# Patient Record
Sex: Male | Born: 1945 | Race: White | Hispanic: No | Marital: Married | Smoking: Former smoker
Health system: Southern US, Community
[De-identification: ages and names within clinical notes are randomized; demographics above are authoritative.]

## PROBLEM LIST (undated history)

## (undated) DIAGNOSIS — M545 Low back pain, unspecified: Secondary | ICD-10-CM

## (undated) DIAGNOSIS — M1990 Unspecified osteoarthritis, unspecified site: Secondary | ICD-10-CM

## (undated) DIAGNOSIS — Z972 Presence of dental prosthetic device (complete) (partial): Secondary | ICD-10-CM

## (undated) DIAGNOSIS — G8929 Other chronic pain: Secondary | ICD-10-CM

## (undated) DIAGNOSIS — E785 Hyperlipidemia, unspecified: Secondary | ICD-10-CM

## (undated) DIAGNOSIS — K219 Gastro-esophageal reflux disease without esophagitis: Secondary | ICD-10-CM

## (undated) DIAGNOSIS — C3490 Malignant neoplasm of unspecified part of unspecified bronchus or lung: Secondary | ICD-10-CM

## (undated) DIAGNOSIS — C61 Malignant neoplasm of prostate: Secondary | ICD-10-CM

## (undated) DIAGNOSIS — Z973 Presence of spectacles and contact lenses: Secondary | ICD-10-CM

## (undated) DIAGNOSIS — R351 Nocturia: Secondary | ICD-10-CM

## (undated) HISTORY — PX: CHOLECYSTECTOMY: SHX55

## (undated) MED FILL — Fosaprepitant Dimeglumine For IV Infusion 150 MG (Base Eq): INTRAVENOUS | Qty: 5 | Status: AC

---

## 2002-07-13 DIAGNOSIS — E7800 Pure hypercholesterolemia, unspecified: Secondary | ICD-10-CM | POA: Insufficient documentation

## 2002-11-16 DIAGNOSIS — R600 Localized edema: Secondary | ICD-10-CM | POA: Insufficient documentation

## 2002-12-10 DIAGNOSIS — I517 Cardiomegaly: Secondary | ICD-10-CM | POA: Insufficient documentation

## 2002-12-25 DIAGNOSIS — D126 Benign neoplasm of colon, unspecified: Secondary | ICD-10-CM | POA: Insufficient documentation

## 2006-07-01 DIAGNOSIS — M1990 Unspecified osteoarthritis, unspecified site: Secondary | ICD-10-CM | POA: Insufficient documentation

## 2013-04-12 HISTORY — PX: CATARACT EXTRACTION W/ INTRAOCULAR LENS  IMPLANT, BILATERAL: SHX1307

## 2014-12-27 ENCOUNTER — Other Ambulatory Visit: Payer: Self-pay | Admitting: Urology

## 2014-12-27 ENCOUNTER — Ambulatory Visit (INDEPENDENT_AMBULATORY_CARE_PROVIDER_SITE_OTHER): Payer: Medicare Other | Admitting: Urology

## 2014-12-27 DIAGNOSIS — N4340 Spermatocele of epididymis, unspecified: Secondary | ICD-10-CM | POA: Diagnosis not present

## 2015-01-02 ENCOUNTER — Ambulatory Visit (HOSPITAL_COMMUNITY)
Admission: RE | Admit: 2015-01-02 | Discharge: 2015-01-02 | Disposition: A | Discharge status: Home / Self Care | Payer: Medicare Other | Source: Ambulatory Visit | Attending: Urology | Admitting: Urology

## 2015-01-02 DIAGNOSIS — N508 Other specified disorders of male genital organs: Secondary | ICD-10-CM | POA: Diagnosis present

## 2015-01-02 DIAGNOSIS — N4340 Spermatocele of epididymis, unspecified: Secondary | ICD-10-CM | POA: Insufficient documentation

## 2015-01-20 ENCOUNTER — Other Ambulatory Visit: Payer: Self-pay | Admitting: Urology

## 2015-01-23 ENCOUNTER — Encounter (HOSPITAL_BASED_OUTPATIENT_CLINIC_OR_DEPARTMENT_OTHER): Payer: Self-pay | Admitting: *Deleted

## 2015-01-23 NOTE — Progress Notes (Signed)
SPOKE W/ WIFE.  NPO AFTER MN.  ARRIVE AT 0730.  NEEDS HG. WILL TAKE TRAMADOL AND ZANTAC AM DOS W/ SIPS OF WATER.  PT TO CALL BACK TO UPDATE IF HAD HX SURGERY'S.

## 2015-01-29 NOTE — H&P (Signed)
History of Present Illness  [name] is a 69 yo WM who is self referred for a growth in the left scrotum that has been present for several years but it has been enlarging and is getting in the way.  He has some pulling discomfort.  He has some LUTS with frequency, urgency, nocturia, intermittency and a reduced stream.  He has no prior history of UTI's or GU surgery.  He has no associated signs or symptoms.   Past Medical History  1. History of High cholesterol (E78.0)  2. History of arthritis (Z87.39)  3. History of esophageal reflux (Z87.19)  Surgical History  1. History of No Surgical Problems  Current Meds  1. Aspirin 81 MG Oral Tablet Delayed Release;  Therapy: (Recorded:16Sep2016) to Recorded  2. Lipitor 40 MG Oral Tablet;  Therapy: (Recorded:16Sep2016) to Recorded  3. Meloxicam 7.5 MG Oral Tablet;  Therapy: (Recorded:16Sep2016) to Recorded  4. TraMADol HCl - 50 MG Oral Tablet;  Therapy: (Recorded:16Sep2016) to Recorded  Allergies  1. No Known Drug Allergies  Family History  1. Family history of malignant neoplasm (Z80.9) : Mother  2. Family history of prostate cancer (Z80.42) : Brother  3. Family history of stroke (Z82.3) : Father  Social History   Alcohol use (Z78.9)   Caffeine use (F15.90)   2 per day   Former smoker [phone])   2 pks per day, smoked for 33 yrs, quit 24 yrs ago   Occupation   Administrator  Review of Systems Genitourinary, constitutional, skin, eye, otolaryngeal, hematologic/lymphatic, cardiovascular, pulmonary, endocrine, musculoskeletal, gastrointestinal, neurological and psychiatric system(s) were reviewed and pertinent findings if present are noted and are otherwise negative.  Genitourinary: urinary frequency, feelings of urinary urgency, nocturia, weak urinary stream and urinary stream starts and stops.  Gastrointestinal: heartburn.  Musculoskeletal: back pain.    Vitals Vital Signs [Data Includes: Last 1 Day]  Recorded: [id number]  08:52AM  Height: 6 ft 2 in Weight: 246 lb  BMI Calculated: 31.58 BSA Calculated: 2.37 Blood Pressure: 136 / 80 Temperature: 98.1 F Heart Rate: 93  Physical Exam Constitutional: Well nourished and well developed . No acute distress.  ENT:. The ears and nose are normal in appearance.  Neck: The appearance of the neck is normal and no neck mass is present.  Pulmonary: No respiratory distress and normal respiratory rhythm and effort.  Cardiovascular: Heart rate and rhythm are normal . No peripheral edema.  Abdomen: The abdomen is soft and nontender. No masses are palpated. No CVA tenderness. No hernias are palpable. No hepatosplenomegaly noted.  Genitourinary: Examination of the penis demonstrates no discharge, no masses, no lesions and a normal meatus. The scrotum is normal in appearance and without lesions. The right epididymis is found to have a 1 cm spermatocele, but palpably normal and non-tender. The left epididymis is found to have a 4 cm spermatocele, but palpably normal and non-tender. The right testis is palpably normal, non-tender and without masses. The left testis is normal, non-tender and without masses.  Lymphatics: The supraclavicular, axillary, femoral and inguinal nodes are not enlarged or tender.  Skin: Normal skin turgor, no visible rash and no visible skin lesions.  Neuro/Psych:. Mood and affect are appropriate.    Results/Data Urine [Data Includes: Last 1 Day]   [id number]  COLOR YELLOW   APPEARANCE CLEAR   SPECIFIC GRAVITY 1.020   pH 5.0   GLUCOSE NEGATIVE   BILIRUBIN NEGATIVE   KETONE NEGATIVE   BLOOD NEGATIVE   PROTEIN NEGATIVE   NITRITE  NEGATIVE   LEUKOCYTE ESTERASE NEGATIVE    The following clinical lab reports were reviewed:  UA reviewed.    Assessment  1. 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   Plan Spermatocele   1. Follow-up Schedule Surgery Office  Follow-up  Status: Hold For - Appointment   Requested for: [id number]   2. SCROTAL U/S; Status:Hold For - Appointment,Date of Service; Requested  for:16Sep2016;  Unlinked   3. UA With REFLEX; [Do Not Release]; Status:Hold For - [employer];  Requested for:14Sep2016;    I am going to get a scrotal US to confirm the diagnosis and r/o complicating factors and will let him know the results. He will let us know if he wants to scheduled spermatocelectomy once he has those results. I have reviewed the risks of the procedure including bleeding, infection, testicular loss or atrophy, recurrent spermatocele, pain, thrombotic events and anesthetic risks.

## 2015-01-30 ENCOUNTER — Ambulatory Visit (HOSPITAL_BASED_OUTPATIENT_CLINIC_OR_DEPARTMENT_OTHER)
Admission: RE | Admit: 2015-01-30 | Discharge: 2015-01-30 | Disposition: A | Discharge status: Home / Self Care | Payer: Medicare Other | Source: Ambulatory Visit | Attending: Urology | Admitting: Urology

## 2015-01-30 ENCOUNTER — Ambulatory Visit (HOSPITAL_BASED_OUTPATIENT_CLINIC_OR_DEPARTMENT_OTHER): Payer: Medicare Other | Admitting: Anesthesiology

## 2015-01-30 ENCOUNTER — Encounter (HOSPITAL_BASED_OUTPATIENT_CLINIC_OR_DEPARTMENT_OTHER): Payer: Self-pay

## 2015-01-30 ENCOUNTER — Encounter (HOSPITAL_BASED_OUTPATIENT_CLINIC_OR_DEPARTMENT_OTHER)
Admission: RE | Disposition: A | Discharge status: Home / Self Care | Payer: Self-pay | Source: Ambulatory Visit | Attending: Urology

## 2015-01-30 DIAGNOSIS — N4340 Spermatocele of epididymis, unspecified: Secondary | ICD-10-CM | POA: Insufficient documentation

## 2015-01-30 DIAGNOSIS — K219 Gastro-esophageal reflux disease without esophagitis: Secondary | ICD-10-CM | POA: Diagnosis not present

## 2015-01-30 DIAGNOSIS — Z79899 Other long term (current) drug therapy: Secondary | ICD-10-CM | POA: Diagnosis not present

## 2015-01-30 DIAGNOSIS — Z791 Long term (current) use of non-steroidal anti-inflammatories (NSAID): Secondary | ICD-10-CM | POA: Diagnosis not present

## 2015-01-30 DIAGNOSIS — M1990 Unspecified osteoarthritis, unspecified site: Secondary | ICD-10-CM | POA: Insufficient documentation

## 2015-01-30 DIAGNOSIS — Z87891 Personal history of nicotine dependence: Secondary | ICD-10-CM | POA: Diagnosis not present

## 2015-01-30 DIAGNOSIS — E7800 Pure hypercholesterolemia, unspecified: Secondary | ICD-10-CM | POA: Insufficient documentation

## 2015-01-30 DIAGNOSIS — Z7982 Long term (current) use of aspirin: Secondary | ICD-10-CM | POA: Diagnosis not present

## 2015-01-30 HISTORY — DX: Low back pain, unspecified: M54.50

## 2015-01-30 HISTORY — DX: Other chronic pain: G89.29

## 2015-01-30 HISTORY — DX: Low back pain: M54.5

## 2015-01-30 HISTORY — PX: SPERMATOCELECTOMY: SHX2420

## 2015-01-30 HISTORY — DX: Unspecified osteoarthritis, unspecified site: M19.90

## 2015-01-30 HISTORY — DX: Presence of dental prosthetic device (complete) (partial): Z97.2

## 2015-01-30 HISTORY — DX: Gastro-esophageal reflux disease without esophagitis: K21.9

## 2015-01-30 HISTORY — DX: Hyperlipidemia, unspecified: E78.5

## 2015-01-30 HISTORY — DX: Presence of spectacles and contact lenses: Z97.3

## 2015-01-30 LAB — POCT HEMOGLOBIN-HEMACUE: HEMOGLOBIN: 16.3 g/dL (ref 13.0–17.0)

## 2015-01-30 SURGERY — EXCISION, SPERMATOCELE
Anesthesia: General | Laterality: Left

## 2015-01-30 MED ORDER — EPHEDRINE SULFATE 50 MG/ML IJ SOLN
INTRAMUSCULAR | Status: DC | PRN
  Administered 2015-01-30: 10 mg via INTRAVENOUS

## 2015-01-30 MED ORDER — HYDROCODONE-ACETAMINOPHEN 5-325 MG PO TABS: 1.0000 | ORAL_TABLET | Freq: Four times a day (QID) | ORAL | Status: DC | PRN

## 2015-01-30 MED ORDER — LIDOCAINE HCL (CARDIAC) 20 MG/ML IV SOLN
INTRAVENOUS | Status: DC | PRN
  Administered 2015-01-30: 80 mg via INTRAVENOUS

## 2015-01-30 MED ORDER — MEPERIDINE HCL 25 MG/ML IJ SOLN
6.2500 mg | INTRAMUSCULAR | Status: DC | PRN
  Filled 2015-01-30: qty 1

## 2015-01-30 MED ORDER — SODIUM CHLORIDE 0.9 % IJ SOLN
3.0000 mL | INTRAMUSCULAR | Status: DC | PRN
  Filled 2015-01-30: qty 3

## 2015-01-30 MED ORDER — SODIUM CHLORIDE 0.9 % IV SOLN
250.0000 mL | INTRAVENOUS | Status: DC | PRN
  Filled 2015-01-30: qty 250

## 2015-01-30 MED ORDER — BUPIVACAINE HCL (PF) 0.25 % IJ SOLN
INTRAMUSCULAR | Status: DC | PRN
  Administered 2015-01-30 (×2): 5 mL

## 2015-01-30 MED ORDER — FENTANYL CITRATE (PF) 100 MCG/2ML IJ SOLN
25.0000 ug | INTRAMUSCULAR | Status: DC | PRN
  Filled 2015-01-30: qty 1

## 2015-01-30 MED ORDER — ACETAMINOPHEN 650 MG RE SUPP
650.0000 mg | RECTAL | Status: DC | PRN
  Filled 2015-01-30: qty 1

## 2015-01-30 MED ORDER — DEXAMETHASONE SODIUM PHOSPHATE 4 MG/ML IJ SOLN
INTRAMUSCULAR | Status: DC | PRN
  Administered 2015-01-30: 10 mg via INTRAVENOUS

## 2015-01-30 MED ORDER — CEFAZOLIN SODIUM-DEXTROSE 2-3 GM-% IV SOLR
2.0000 g | INTRAVENOUS | Status: AC
  Administered 2015-01-30: 2 g via INTRAVENOUS
  Filled 2015-01-30: qty 50

## 2015-01-30 MED ORDER — HYDROMORPHONE HCL 1 MG/ML IJ SOLN
0.2500 mg | INTRAMUSCULAR | Status: DC | PRN
  Filled 2015-01-30: qty 1

## 2015-01-30 MED ORDER — OXYCODONE HCL 5 MG PO TABS
ORAL_TABLET | ORAL | Status: AC
  Filled 2015-01-30: qty 1

## 2015-01-30 MED ORDER — SODIUM CHLORIDE 0.9 % IJ SOLN
3.0000 mL | Freq: Two times a day (BID) | INTRAMUSCULAR | Status: DC
  Filled 2015-01-30: qty 3

## 2015-01-30 MED ORDER — LACTATED RINGERS IV SOLN
INTRAVENOUS | Status: DC
  Administered 2015-01-30: 08:00:00 via INTRAVENOUS
  Filled 2015-01-30: qty 1000

## 2015-01-30 MED ORDER — ACETAMINOPHEN 325 MG PO TABS
650.0000 mg | ORAL_TABLET | ORAL | Status: DC | PRN
  Filled 2015-01-30: qty 2

## 2015-01-30 MED ORDER — FENTANYL CITRATE (PF) 100 MCG/2ML IJ SOLN
INTRAMUSCULAR | Status: AC
  Filled 2015-01-30: qty 4

## 2015-01-30 MED ORDER — MIDAZOLAM HCL 2 MG/2ML IJ SOLN
INTRAMUSCULAR | Status: AC
  Filled 2015-01-30: qty 2

## 2015-01-30 MED ORDER — MIDAZOLAM HCL 5 MG/5ML IJ SOLN
INTRAMUSCULAR | Status: DC | PRN
  Administered 2015-01-30: 2 mg via INTRAVENOUS

## 2015-01-30 MED ORDER — PROMETHAZINE HCL 25 MG/ML IJ SOLN
6.2500 mg | INTRAMUSCULAR | Status: DC | PRN
Start: 2015-01-30 — End: 2015-01-30
  Filled 2015-01-30: qty 1

## 2015-01-30 MED ORDER — FENTANYL CITRATE (PF) 100 MCG/2ML IJ SOLN
INTRAMUSCULAR | Status: DC | PRN
  Administered 2015-01-30 (×2): 50 ug via INTRAVENOUS

## 2015-01-30 MED ORDER — CEFAZOLIN SODIUM-DEXTROSE 2-3 GM-% IV SOLR
INTRAVENOUS | Status: AC
  Filled 2015-01-30: qty 50

## 2015-01-30 MED ORDER — PROPOFOL 10 MG/ML IV BOLUS
INTRAVENOUS | Status: DC | PRN
  Administered 2015-01-30: 160 mg via INTRAVENOUS

## 2015-01-30 MED ORDER — OXYCODONE HCL 5 MG PO TABS
5.0000 mg | ORAL_TABLET | ORAL | Status: DC | PRN
  Administered 2015-01-30: 5 mg via ORAL
  Filled 2015-01-30: qty 2

## 2015-01-30 SURGICAL SUPPLY — 39 items
APPLICATOR COTTON TIP 6IN STRL (MISCELLANEOUS) ×3 IMPLANT
BLADE SURG 15 STRL LF DISP TIS (BLADE) ×1 IMPLANT
BLADE SURG 15 STRL SS (BLADE) ×2
BNDG GAUZE ELAST 4 BULKY (GAUZE/BANDAGES/DRESSINGS) ×3 IMPLANT
CANISTER SUCTION 1200CC (MISCELLANEOUS) IMPLANT
CANISTER SUCTION 2500CC (MISCELLANEOUS) IMPLANT
CLEANER CAUTERY TIP 5X5 PAD (MISCELLANEOUS) ×1 IMPLANT
CLOTH BEACON ORANGE TIMEOUT ST (SAFETY) ×3 IMPLANT
COVER BACK TABLE 60X90IN (DRAPES) ×3 IMPLANT
COVER MAYO STAND STRL (DRAPES) ×3 IMPLANT
DISSECTOR ROUND CHERRY 3/8 STR (MISCELLANEOUS) IMPLANT
DRAIN PENROSE 18X1/4 LTX STRL (WOUND CARE) IMPLANT
DRAPE PED LAPAROTOMY (DRAPES) ×3 IMPLANT
ELECT REM PT RETURN 9FT ADLT (ELECTROSURGICAL) ×3
ELECTRODE REM PT RTRN 9FT ADLT (ELECTROSURGICAL) ×1 IMPLANT
GLOVE BIO SURGEON STRL SZ7.5 (GLOVE) ×6 IMPLANT
GLOVE BIOGEL PI IND STRL 7.5 (GLOVE) ×1 IMPLANT
GLOVE BIOGEL PI INDICATOR 7.5 (GLOVE) ×2
GLOVE SURG SS PI 8.0 STRL IVOR (GLOVE) ×3 IMPLANT
GOWN STRL REUS W/ TWL LRG LVL4 (GOWN DISPOSABLE) ×1 IMPLANT
GOWN STRL REUS W/TWL LRG LVL4 (GOWN DISPOSABLE) ×2
GOWN W/2 COTTON TOWELS 2 STD (GOWNS) ×3 IMPLANT
KIT ROOM TURNOVER WOR (KITS) ×3 IMPLANT
NEEDLE HYPO 22GX1.5 SAFETY (NEEDLE) ×6 IMPLANT
NS IRRIG 500ML POUR BTL (IV SOLUTION) ×3 IMPLANT
PACK BASIN DAY SURGERY FS (CUSTOM PROCEDURE TRAY) ×3 IMPLANT
PAD CLEANER CAUTERY TIP 5X5 (MISCELLANEOUS) ×2
PENCIL BUTTON HOLSTER BLD 10FT (ELECTRODE) ×3 IMPLANT
SUPPORT SCROTAL LG STRP (MISCELLANEOUS) ×2 IMPLANT
SUPPORTER ATHLETIC LG (MISCELLANEOUS) ×1
SUT CHROMIC 3 0 SH 27 (SUTURE) ×9 IMPLANT
SUT VICRYL 0 TIES 12 18 (SUTURE) ×3 IMPLANT
SYR BULB IRRIGATION 50ML (SYRINGE) IMPLANT
SYRINGE CONTROL L 12CC (SYRINGE) ×3 IMPLANT
TRAY DSU PREP LF (CUSTOM PROCEDURE TRAY) ×3 IMPLANT
TUBE CONNECTING 12'X1/4 (SUCTIONS) ×1
TUBE CONNECTING 12X1/4 (SUCTIONS) ×2 IMPLANT
WATER STERILE IRR 500ML POUR (IV SOLUTION) IMPLANT
YANKAUER SUCT BULB TIP NO VENT (SUCTIONS) ×3 IMPLANT

## 2015-01-30 NOTE — Discharge Instructions (Addendum)
Hydrocelectomy, Care After Refer to this sheet in the next few weeks. These instructions provide you with information about caring for yourself after your procedure. Your health care provider may also give you more specific instructions. Your treatment has been planned according to current medical practices, but problems sometimes occur. Call your health care provider if you have any problems or questions after your procedure. WHAT TO EXPECT AFTER THE PROCEDURE After your procedure, it is common for the pouch that holds your testicles (scrotum) to be painful, swollen, and bruised. HOME CARE INSTRUCTIONS Bathing  Ask your health care provider when you can shower, take baths, or go swimming.  If you were told to wear an athletic support strap, take it off when you shower or take a bath. Incision Care  Follow instructions from your health care provider about how to take care of your incision. Make sure you:  Wash your hands with soap and water before you change your bandage (dressing). If soap and water are not available, use hand sanitizer.  Change your dressing as told by your health care provider.  Leave stitches (sutures) in place.  Check your incision and scrotum every day for signs of infection. Check for:  More redness, swelling, or pain.  Blood or fluid.  Warmth.  Pus or a bad smell. Managing Pain, Stiffness, and Swelling  If directed, apply ice to the injured area:  Put ice in a plastic bag.  Place a towel between your skin and the bag.  Leave the ice on for 20 minutes, 2-3 times per day. Driving  Do not drive for 24 hours if you received a sedative.  Do not drive or operate heavy machinery while taking prescription pain medicine.  Ask your health care provider when it is safe to drive. Activity  Do not do any activities that require great strength and energy (are vigorous) for as long as told by your health care provider.  Return to your normal activities as  told by your health care provider. Ask your health care provider what activities are safe for you.  Do not lift anything that is heavier than 10 lb (4.5 kg) until your health care provider says that it is safe. General Instructions  Take over-the-counter and prescription medicines only as told by your health care provider.  Keep all follow-up visits as told by your health care provider. This is important.  If you were given an athletic support strap, wear it as told by your health care provider.  If you had a drain put in during the procedure, you will need to return to have it removed. SEEK MEDICAL CARE IF:  Your pain gets worse.  You have more redness, swelling, or pain around your scrotum.  You have blood or fluid coming from your scrotum.  Your incision feels warm to the touch.  You have pus or a bad smell coming from your scrotum.  You have a fever.   This information is not intended to replace advice given to you by your health care provider. Make sure you discuss any questions you have with your health care provider.   Document Released: 12/18/2014 Document Reviewed: 09/25/2014 Elsevier Interactive Patient Education 2016 Lance Stafford can shower in 48 hours.  You will probably want to wait a week to return to work.    Post Anesthesia Home Care Instructions  Activity: Get plenty of rest for the remainder of the day. A responsible adult should stay with you for 24 hours following  the procedure.  For the next 24 hours, DO NOT: -Drive a car -Paediatric nurse -Drink alcoholic beverages -Take any medication unless instructed by your physician -Make any legal decisions or sign important papers.  Meals: Start with liquid foods such as gelatin or soup. Progress to regular foods as tolerated. Avoid greasy, spicy, heavy foods. If nausea and/or vomiting occur, drink only clear liquids until the nausea and/or vomiting subsides. Call your physician if vomiting  continues.  Special Instructions/Symptoms: Your throat may feel dry or sore from the anesthesia or the breathing tube placed in your throat during surgery. If this causes discomfort, gargle with warm salt water. The discomfort should disappear within 24 hours.  If you had a scopolamine patch placed behind your ear for the management of post- operative nausea and/or vomiting:  1. The medication in the patch is effective for 72 hours, after which it should be removed.  Wrap patch in a tissue and discard in the trash. Wash hands thoroughly with soap and water. 2. You may remove the patch earlier than 72 hours if you experience unpleasant side effects which may include dry mouth, dizziness or visual disturbances. 3. Avoid touching the patch. Wash your hands with soap and water after contact with the patch.

## 2015-01-30 NOTE — Anesthesia Preprocedure Evaluation (Addendum)
Anesthesia Evaluation  Patient identified by MRN, date of birth, ID band Patient awake    Reviewed: Allergy & Precautions, NPO status , Patient's Chart, lab work & pertinent test results  Airway Mallampati: II  TM Distance: >3 FB Neck ROM: Full    Dental no notable dental hx. (+) Teeth Intact, Partial Upper, Partial Lower   Pulmonary former smoker,    Pulmonary exam normal breath sounds clear to auscultation       Cardiovascular negative cardio ROS Normal cardiovascular exam Rhythm:Regular Rate:Normal     Neuro/Psych negative neurological ROS  negative psych ROS   GI/Hepatic Neg liver ROS, GERD  Medicated,  Endo/Other  negative endocrine ROS  Renal/GU negative Renal ROS     Musculoskeletal negative musculoskeletal ROS (+) Arthritis ,   Abdominal   Peds  Hematology negative hematology ROS (+)   Anesthesia Other Findings   Reproductive/Obstetrics                           Anesthesia Physical Anesthesia Plan  ASA: II  Anesthesia Plan: General   Post-op Pain Management:    Induction: Intravenous  Airway Management Planned: LMA  Additional Equipment:   Intra-op Plan:   Post-operative Plan: Extubation in OR  Informed Consent: I have reviewed the patients History and Physical, chart, labs and discussed the procedure including the risks, benefits and alternatives for the proposed anesthesia with the patient or authorized representative who has indicated his/her understanding and acceptance.   Dental advisory given  Plan Discussed with: CRNA and Anesthesiologist  Anesthesia Plan Comments:        Anesthesia Quick Evaluation

## 2015-01-30 NOTE — Anesthesia Procedure Notes (Signed)
Procedure Name: LMA Insertion Date/Time: 01/30/2015 9:04 AM Performed by: Wanita Chamberlain Pre-anesthesia Checklist: Patient identified, Timeout performed, Emergency Drugs available, Suction available and Patient being monitored Patient Re-evaluated:Patient Re-evaluated prior to inductionOxygen Delivery Method: Circle system utilized Preoxygenation: Pre-oxygenation with 100% oxygen Intubation Type: IV induction Ventilation: Mask ventilation without difficulty LMA: LMA inserted LMA Size: 5.0 Number of attempts: 1 Placement Confirmation: positive ETCO2 and breath sounds checked- equal and bilateral Tube secured with: Tape Dental Injury: Teeth and Oropharynx as per pre-operative assessment

## 2015-01-30 NOTE — Brief Op Note (Signed)
01/30/2015  10:01 AM  PATIENT:  Lance Stafford  69 y.o. male  PRE-OPERATIVE DIAGNOSIS:  LEFT SPERMATOCELE  POST-OPERATIVE DIAGNOSIS:  LEFT SPERMATOCELE  PROCEDURE:  Procedure(s): SPERMATOCELECTOMY (Left)  SURGEON:  Surgeon(s) and Role:    * Irine Seal, MD - Primary  PHYSICIAN ASSISTANT:   ASSISTANTS: none   ANESTHESIA:   general  EBL:     BLOOD ADMINISTERED:none  DRAINS: none   LOCAL MEDICATIONS USED:  MARCAINE    and Amount: 10 ml  SPECIMEN:  No Specimen  DISPOSITION OF SPECIMEN:  N/A  COUNTS:  YES  TOURNIQUET:  * No tourniquets in log *  DICTATION: .Other Dictation: Dictation Number [id number]  PLAN OF CARE: Discharge to home after PACU  PATIENT DISPOSITION:  PACU - hemodynamically stable.   Delay start of Pharmacological VTE agent (>24hrs) due to surgical blood loss or risk of bleeding: not applicable

## 2015-01-30 NOTE — Anesthesia Postprocedure Evaluation (Signed)
Anesthesia Post Note  Patient: Lance Stafford  Procedure(s) Performed: Procedure(s) (LRB): SPERMATOCELECTOMY (Left)  Anesthesia type: General  Patient location: PACU  Post pain: Pain level controlled  Post assessment: Post-op Vital signs reviewed  Last Vitals: BP 128/75 mmHg  Pulse 60  Temp(Src) 36.4 C (Oral)  Resp 14  Ht '6\' 1"'$  (1.854 m)  Wt 234 lb 8 oz (106.369 kg)  BMI 30.95 kg/m2  SpO2 99%  Post vital signs: Reviewed  Level of consciousness: sedated  Complications: No apparent anesthesia complications

## 2015-01-30 NOTE — Transfer of Care (Signed)
Immediate Anesthesia Transfer of Care Note  Patient: Lance Stafford  Procedure(s) Performed: Procedure(s): SPERMATOCELECTOMY (Left)  Patient Location: PACU  Anesthesia Type:General  Level of Consciousness: awake, alert , oriented and patient cooperative  Airway & Oxygen Therapy: Patient Spontanous Breathing and Patient connected to nasal cannula oxygen  Post-op Assessment: Report given to RN and Post -op Vital signs reviewed and stable  Post vital signs: Reviewed and stable  Last Vitals:  Filed Vitals:   01/30/15 0717  BP: 129/75  Pulse: 55  Temp: 36.6 C  Resp: 16    Complications: No apparent anesthesia complications

## 2015-01-30 NOTE — Interval H&P Note (Signed)
History and Physical Interval Note:  01/30/2015 8:50 AM  Lance Stafford  has presented today for surgery, with the diagnosis of LEFT SPERMATOCELE  The various methods of treatment have been discussed with the patient and family. After consideration of risks, benefits and other options for treatment, the patient has consented to  Procedure(s): SPERMATOCELECTOMY (Left) as a surgical intervention .  The patient's history has been reviewed, patient examined, no change in status, stable for surgery.  I have reviewed the patient's chart and labs.  Questions were answered to the patient's satisfaction.     [name]

## 2015-01-31 ENCOUNTER — Encounter (HOSPITAL_BASED_OUTPATIENT_CLINIC_OR_DEPARTMENT_OTHER): Payer: Self-pay | Admitting: Urology

## 2015-02-03 NOTE — Op Note (Signed)
NAME:  VERDELL, DYKMAN NO.:  [id number]  MEDICAL RECORD NO.:  [id number]  LOCATION:                               FACILITY:  [facility]  PHYSICIAN:  Marshall Cork. Jeffie Pollock, M.D.    DATE OF BIRTH:  February 25, 1946  DATE OF PROCEDURE:  01/30/2015 DATE OF DISCHARGE:  01/30/2015                              OPERATIVE REPORT   PROCEDURE PERFORMED:  Left spermatocelectomy.  PREOPERATIVE DIAGNOSIS:  Left spermatocelectomy.  POSTOPERATIVE DIAGNOSIS:  Left spermatocelectomy.  ANESTHESIA:  General.  SPECIMEN:  None.  DRAINS:  None.  BLOOD LOSS:  None.  COMPLICATIONS:  None.  INDICATIONS:  Mr. Bensinger is a 69 year old male with 4-6 cm left spermatocele, who is to undergo spermatocelectomy.  FINDINGS AND PROCEDURE:  He was taken to the operating room where general anesthetic was induced.  He was fitted with PAS hose in the supine position.  His genitalia were clipped and he was prepped with Betadine solution, draped in usual sterile fashion.  He was given 2 g of Ancef for antibiotic prophylaxis.  A left anterior oblique incision was made along the scrotal skin line with the knife.  This was carried down through the dartos with the Bovie.  The testicle within the tunica vaginalis was delivered from the wound.  The tunica vaginalis was opened and the large spermatocele was dissected away from the testicle and epididymis using sharp and cautery dissection.  Once the neck of the spermatocele was identified, it was clamped with a hemostat and spermatocele sac was removed.  The neck was then oversewn with a 3-0 chromic.  Once this had been performed, inspection revealed no other significant cystic structures.  The tunica vaginalis was imbricated behind the testicle in a water bottle fashion. A core block was performed with 5 mL of 0.25% Marcaine and the testicle was returned to the left hemiscrotum.  The dartos was closed with a running 3-0 chromic and subcutaneous injection of  remaining 5 mL of Marcaine was performed along the incision line.  The skin was then closed using an interrupted 3-0 chromic vertical mattress suture.  The drapes were removed.  Wound was cleansed. The dressing was applied using 4x4s, fluffs, Kerlix, and athletic supporter.  His anesthetic was reversed.  He was moved to the recovery room in stable condition.  There were no complications.     Marshall Cork. Jeffie Pollock, M.D.     JJW/MEDQ  D:  02/02/2015  T:  02/02/2015  Job:  [id number]

## 2015-02-21 ENCOUNTER — Ambulatory Visit (INDEPENDENT_AMBULATORY_CARE_PROVIDER_SITE_OTHER): Payer: Self-pay | Admitting: Urology

## 2015-02-21 DIAGNOSIS — N4340 Spermatocele of epididymis, unspecified: Secondary | ICD-10-CM

## 2015-05-09 ENCOUNTER — Ambulatory Visit (INDEPENDENT_AMBULATORY_CARE_PROVIDER_SITE_OTHER): Payer: Medicare Other | Admitting: Urology

## 2015-05-09 ENCOUNTER — Other Ambulatory Visit: Payer: Self-pay | Admitting: Urology

## 2015-05-09 DIAGNOSIS — R972 Elevated prostate specific antigen [PSA]: Secondary | ICD-10-CM

## 2015-05-09 DIAGNOSIS — N4340 Spermatocele of epididymis, unspecified: Secondary | ICD-10-CM | POA: Diagnosis not present

## 2015-05-27 ENCOUNTER — Other Ambulatory Visit: Payer: Self-pay | Admitting: Urology

## 2015-05-27 DIAGNOSIS — R972 Elevated prostate specific antigen [PSA]: Secondary | ICD-10-CM

## 2015-05-30 ENCOUNTER — Ambulatory Visit (HOSPITAL_COMMUNITY)
Admission: RE | Admit: 2015-05-30 | Discharge: 2015-05-30 | Disposition: A | Discharge status: Home / Self Care | Payer: Medicare Other | Source: Ambulatory Visit | Attending: Urology | Admitting: Urology

## 2015-05-30 DIAGNOSIS — R972 Elevated prostate specific antigen [PSA]: Secondary | ICD-10-CM | POA: Diagnosis not present

## 2015-05-30 MED ORDER — LIDOCAINE HCL (PF) 2 % IJ SOLN
INTRAMUSCULAR | Status: AC
  Administered 2015-05-30: 10 mL
  Filled 2015-05-30: qty 10

## 2015-05-30 MED ORDER — CEFTRIAXONE SODIUM 1 G IJ SOLR
INTRAMUSCULAR | Status: AC
  Administered 2015-05-30: 1 g via INTRAMUSCULAR
  Filled 2015-05-30: qty 10

## 2015-05-30 MED ORDER — LIDOCAINE HCL (PF) 1 % IJ SOLN
5.0000 mL | Freq: Once | INTRAMUSCULAR | Status: AC
  Administered 2015-05-30: 2 mL

## 2015-05-30 MED ORDER — LIDOCAINE HCL (PF) 2 % IJ SOLN
10.0000 mL | Freq: Once | INTRAMUSCULAR | Status: AC
  Administered 2015-05-30: 10 mL

## 2015-05-30 MED ORDER — CEFTRIAXONE SODIUM 1 G IJ SOLR
1.0000 g | Freq: Once | INTRAMUSCULAR | Status: AC
  Administered 2015-05-30: 1 g via INTRAMUSCULAR

## 2015-05-30 MED ORDER — LIDOCAINE HCL (PF) 1 % IJ SOLN
INTRAMUSCULAR | Status: AC
  Administered 2015-05-30: 2 mL
  Filled 2015-05-30: qty 5

## 2015-05-30 NOTE — Discharge Instructions (Signed)
Transrectal Ultrasound-Guided Biopsy A transrectal ultrasound-guided biopsy is a procedure to take samples of tissue from your prostate. Ultrasound images are used to guide the procedure. It is usually done to check the prostate gland for cancer. BEFORE THE PROCEDURE  Do not eat or drink after midnight on the night before your procedure.  Take medicines as your doctor tells you.  Your doctor may have you stop taking some medicines 5-7 days before the procedure.  You will be given an enema before your procedure. During an enema, a liquid is put into your butt (rectum) to clear out waste.  You may have lab tests the day of your procedure.  Make plans to have someone drive you home. PROCEDURE  You will be given medicine to help you relax before the procedure. An IV tube will be put into one of your veins. It will be used to give fluids and medicine.  You will be given medicine to reduce the risk of infection (antibiotic).  You will be placed on your side.  A probe with gel will be put in your butt. This is used to take pictures of your prostate and the area around it.  A medicine to numb the area is put into your prostate.  A biopsy needle is then inserted and guided to your prostate.  Samples of prostate tissue are taken. The needle is removed.  The samples are sent to a lab to be checked. Results are usually back in 2-3 days. AFTER THE PROCEDURE  You will be taken to a room where you will be watched until you are doing okay.  You may have some pain in the area around your butt. You will be given medicines for this.  You may be able to go home the same day. Sometimes, an overnight stay in the hospital is needed.   This information is not intended to replace advice given to you by your health care provider. Make sure you discuss any questions you have with your health care provider.   Document Released: 03/17/2009 Document Revised: 04/03/2013 Document Reviewed:  11/15/2012 Elsevier Interactive Patient Education [employer].

## 2015-06-13 ENCOUNTER — Encounter (INDEPENDENT_AMBULATORY_CARE_PROVIDER_SITE_OTHER): Payer: Medicare Other | Admitting: Urology

## 2015-06-13 DIAGNOSIS — R972 Elevated prostate specific antigen [PSA]: Secondary | ICD-10-CM

## 2015-06-13 DIAGNOSIS — C61 Malignant neoplasm of prostate: Secondary | ICD-10-CM | POA: Diagnosis not present

## 2015-07-03 ENCOUNTER — Ambulatory Visit (HOSPITAL_COMMUNITY)
Admission: RE | Admit: 2015-07-03 | Discharge: 2015-07-03 | Disposition: A | Discharge status: Home / Self Care | Payer: Medicare Other | Source: Ambulatory Visit | Attending: Urology | Admitting: Urology

## 2015-07-03 DIAGNOSIS — C61 Malignant neoplasm of prostate: Secondary | ICD-10-CM

## 2015-07-03 LAB — POCT I-STAT CREATININE: CREATININE: 0.9 mg/dL (ref 0.61–1.24)

## 2015-07-03 MED ORDER — GADOBENATE DIMEGLUMINE 529 MG/ML IV SOLN
20.0000 mL | Freq: Once | INTRAVENOUS | Status: AC | PRN
  Administered 2015-07-03: 20 mL via INTRAVENOUS

## 2015-09-12 ENCOUNTER — Ambulatory Visit (INDEPENDENT_AMBULATORY_CARE_PROVIDER_SITE_OTHER): Payer: Medicare Other | Admitting: Urology

## 2015-09-12 DIAGNOSIS — C61 Malignant neoplasm of prostate: Secondary | ICD-10-CM

## 2015-09-12 DIAGNOSIS — R972 Elevated prostate specific antigen [PSA]: Secondary | ICD-10-CM | POA: Diagnosis not present

## 2015-12-26 ENCOUNTER — Ambulatory Visit (INDEPENDENT_AMBULATORY_CARE_PROVIDER_SITE_OTHER): Payer: Medicare Other | Admitting: Urology

## 2015-12-26 DIAGNOSIS — C61 Malignant neoplasm of prostate: Secondary | ICD-10-CM | POA: Diagnosis not present

## 2015-12-26 DIAGNOSIS — R972 Elevated prostate specific antigen [PSA]: Secondary | ICD-10-CM | POA: Diagnosis not present

## 2016-03-26 ENCOUNTER — Ambulatory Visit: Payer: Medicare Other | Admitting: Urology

## 2016-03-26 ENCOUNTER — Ambulatory Visit (INDEPENDENT_AMBULATORY_CARE_PROVIDER_SITE_OTHER): Payer: Medicare Other | Admitting: Urology

## 2016-03-26 ENCOUNTER — Other Ambulatory Visit: Payer: Self-pay | Admitting: Urology

## 2016-03-26 DIAGNOSIS — R972 Elevated prostate specific antigen [PSA]: Secondary | ICD-10-CM

## 2016-03-26 DIAGNOSIS — C61 Malignant neoplasm of prostate: Secondary | ICD-10-CM

## 2016-04-16 ENCOUNTER — Encounter (HOSPITAL_COMMUNITY): Payer: Self-pay

## 2016-04-16 ENCOUNTER — Ambulatory Visit (HOSPITAL_COMMUNITY)
Admission: RE | Admit: 2016-04-16 | Discharge: 2016-04-16 | Disposition: A | Discharge status: Home / Self Care | Payer: Medicare Other | Source: Ambulatory Visit | Attending: Urology | Admitting: Urology

## 2016-04-16 ENCOUNTER — Inpatient Hospital Stay (HOSPITAL_COMMUNITY): Admission: RE | Admit: 2016-04-16 | Payer: Medicare Other | Source: Ambulatory Visit

## 2016-04-16 DIAGNOSIS — C61 Malignant neoplasm of prostate: Secondary | ICD-10-CM | POA: Diagnosis not present

## 2016-04-16 DIAGNOSIS — R972 Elevated prostate specific antigen [PSA]: Secondary | ICD-10-CM | POA: Diagnosis not present

## 2016-04-16 MED ORDER — LIDOCAINE HCL (PF) 2 % IJ SOLN
INTRAMUSCULAR | Status: AC
  Administered 2016-04-16: 10 mL
  Filled 2016-04-16: qty 10

## 2016-04-16 MED ORDER — LIDOCAINE HCL (PF) 1 % IJ SOLN
5.0000 mL | Freq: Once | INTRAMUSCULAR | Status: AC
  Administered 2016-04-16: 2.1 mL

## 2016-04-16 MED ORDER — CEFTRIAXONE SODIUM 1 G IJ SOLR
1.0000 g | Freq: Once | INTRAMUSCULAR | Status: AC
  Administered 2016-04-16: 1 g via INTRAMUSCULAR

## 2016-04-16 MED ORDER — LIDOCAINE HCL (PF) 1 % IJ SOLN
INTRAMUSCULAR | Status: AC
  Administered 2016-04-16: 2.1 mL
  Filled 2016-04-16: qty 5

## 2016-04-16 MED ORDER — LIDOCAINE HCL (PF) 2 % IJ SOLN
10.0000 mL | Freq: Once | INTRAMUSCULAR | Status: AC
  Administered 2016-04-16: 10 mL

## 2016-04-16 MED ORDER — CEFTRIAXONE SODIUM 1 G IJ SOLR
INTRAMUSCULAR | Status: AC
  Administered 2016-04-16: 1 g via INTRAMUSCULAR
  Filled 2016-04-16: qty 10

## 2016-04-16 NOTE — Discharge Instructions (Signed)
Transrectal Ultrasound-Guided Biopsy °A transrectal ultrasound-guided biopsy is a procedure to take samples of tissue from your prostate. Ultrasound images are used to guide the procedure. It is usually done to check the prostate gland for cancer. °What happens before the procedure? °· Do not eat or drink after midnight on the night before your procedure. °· Take medicines as your doctor tells you. °· Your doctor may have you stop taking some medicines 5-7 days before the procedure. °· You will be given an enema before your procedure. During an enema, a liquid is put into your butt (rectum) to clear out waste. °· You may have lab tests the day of your procedure. °· Make plans to have someone drive you home. °What happens during the procedure? °· You will be given medicine to help you relax before the procedure. An IV tube will be put into one of your veins. It will be used to give fluids and medicine. °· You will be given medicine to reduce the risk of infection (antibiotic). °· You will be placed on your side. °· A probe with gel will be put in your butt. This is used to take pictures of your prostate and the area around it. °· A medicine to numb the area is put into your prostate. °· A biopsy needle is then inserted and guided to your prostate. °· Samples of prostate tissue are taken. The needle is removed. °· The samples are sent to a lab to be checked. Results are usually back in 2-3 days. °What happens after the procedure? °· You will be taken to a room where you will be watched until you are doing okay. °· You may have some pain in the area around your butt. You will be given medicines for this. °· You may be able to go home the same day. Sometimes, an overnight stay in the hospital is needed. °This information is not intended to replace advice given to you by your health care provider. Make sure you discuss any questions you have with your health care provider. °Document Released: 03/17/2009 Document Revised:  09/04/2015 Document Reviewed: 11/15/2012 °Elsevier Interactive Patient Education © 2017 Elsevier Inc. ° °

## 2016-06-04 ENCOUNTER — Ambulatory Visit (INDEPENDENT_AMBULATORY_CARE_PROVIDER_SITE_OTHER): Payer: Medicare Other | Admitting: Urology

## 2016-06-04 DIAGNOSIS — R3912 Poor urinary stream: Secondary | ICD-10-CM | POA: Diagnosis not present

## 2016-06-04 DIAGNOSIS — C61 Malignant neoplasm of prostate: Secondary | ICD-10-CM

## 2016-06-04 DIAGNOSIS — N401 Enlarged prostate with lower urinary tract symptoms: Secondary | ICD-10-CM | POA: Diagnosis not present

## 2016-06-14 ENCOUNTER — Telehealth: Payer: Self-pay | Admitting: Medical Oncology

## 2016-06-14 NOTE — Progress Notes (Signed)
I called Lance Stafford to introduce myself as the Prostate Nurse Navigator and the Coordinator of the Prostate [place].  1. I confirmed with the patient he is aware of his referral to the clinic March 09,2018 arriving at 8 am.   2. I discussed the format of the clinic and the physicians he will be seeing that day.  3. I discussed where the clinic is located and how to contact me.  4. I confirmed his address and informed him I would be mailing a packet of information and forms to be completed. I asked him to bring them with him the day of his appointment.   He voiced understanding of the above. I asked him to call me if he has any questions or concerns regarding his appointments or the forms he needs to complete.

## 2016-06-15 ENCOUNTER — Encounter: Payer: Self-pay | Admitting: Medical Oncology

## 2016-06-15 NOTE — Progress Notes (Signed)
nav

## 2016-06-15 NOTE — Progress Notes (Signed)
Requested prostate biopsy slides from [facility] for prostate clinic.

## 2016-06-17 ENCOUNTER — Telehealth: Payer: Self-pay | Admitting: Medical Oncology

## 2016-06-17 DIAGNOSIS — C61 Malignant neoplasm of prostate: Secondary | ICD-10-CM | POA: Insufficient documentation

## 2016-06-17 DIAGNOSIS — E785 Hyperlipidemia, unspecified: Secondary | ICD-10-CM | POA: Insufficient documentation

## 2016-06-17 NOTE — Progress Notes (Signed)
GU Location of Tumor / Histology: prostatic adenocarcinoma  If Prostate Cancer, Gleason Score is (3 + 3) and PSA is (11.9) on 03/19/16  Lance Stafford was diagnosed one year ago with prostate cancer and opted for surveillance.  Biopsies of prostate (if applicable) revealed:    Past/Anticipated interventions by urology, if any: biopsy, surveillance, biopsy, referral to [facility]  Past/Anticipated interventions by medical oncology, if any: no  Weight changes, if any: no  Bowel/Bladder complaints, if any: frequency, nocturia, intermittency, and ED.   Nausea/Vomiting, if any: no  Pain issues, if any:  Back pain  SAFETY ISSUES:  Prior radiation? no  Pacemaker/ICD? no  Possible current pregnancy? no  Is the patient on methotrexate? no  Current Complaints / other details:  71 year old male. Married to Tyler. Has two children, Shelia and Marshall.   Brother with hx of prostate ca. Had colonoscopy in 2017.  Patient did not complete AUA.

## 2016-06-17 NOTE — Progress Notes (Signed)
Left a message reminding pt of appointment in the Prostate [facility] 06/18/16 arriving around 7:45 am. I reminded him to bring his completed medical forms and to call if he has questions or concerns.

## 2016-06-17 NOTE — Progress Notes (Signed)
Spoke with Lance Stafford and he confirmed his appointment for [name] 06/18/16 arriving at 7:45am. Reminded him to bring his completed medical forms and we reviewed the address. I asked him to call radiation oncology if he has any issues with finding [facility]. He voiced understanding.

## 2016-06-18 ENCOUNTER — Ambulatory Visit
Admission: RE | Admit: 2016-06-18 | Discharge: 2016-06-18 | Disposition: A | Discharge status: Home / Self Care | Payer: Medicare Other | Source: Ambulatory Visit | Attending: Radiation Oncology | Admitting: Radiation Oncology

## 2016-06-18 ENCOUNTER — Ambulatory Visit (HOSPITAL_BASED_OUTPATIENT_CLINIC_OR_DEPARTMENT_OTHER): Payer: Medicare Other | Admitting: Oncology

## 2016-06-18 ENCOUNTER — Encounter: Payer: Self-pay | Admitting: Medical Oncology

## 2016-06-18 ENCOUNTER — Encounter: Payer: Self-pay | Admitting: General Practice

## 2016-06-18 ENCOUNTER — Encounter: Payer: Self-pay | Admitting: Radiation Oncology

## 2016-06-18 VITALS — BP 138/92 | HR 65 | Resp 18 | Ht 74.0 in | Wt 228.2 lb

## 2016-06-18 DIAGNOSIS — G8929 Other chronic pain: Secondary | ICD-10-CM | POA: Diagnosis not present

## 2016-06-18 DIAGNOSIS — Z79899 Other long term (current) drug therapy: Secondary | ICD-10-CM | POA: Diagnosis not present

## 2016-06-18 DIAGNOSIS — Z87891 Personal history of nicotine dependence: Secondary | ICD-10-CM

## 2016-06-18 DIAGNOSIS — C61 Malignant neoplasm of prostate: Secondary | ICD-10-CM

## 2016-06-18 DIAGNOSIS — K219 Gastro-esophageal reflux disease without esophagitis: Secondary | ICD-10-CM | POA: Insufficient documentation

## 2016-06-18 DIAGNOSIS — Z9889 Other specified postprocedural states: Secondary | ICD-10-CM | POA: Insufficient documentation

## 2016-06-18 DIAGNOSIS — E785 Hyperlipidemia, unspecified: Secondary | ICD-10-CM

## 2016-06-18 DIAGNOSIS — Z8042 Family history of malignant neoplasm of prostate: Secondary | ICD-10-CM | POA: Diagnosis not present

## 2016-06-18 DIAGNOSIS — Z7982 Long term (current) use of aspirin: Secondary | ICD-10-CM | POA: Insufficient documentation

## 2016-06-18 NOTE — Progress Notes (Signed)
                               Care Plan Summary  Name: Lance Stafford DOB: [birth year]   Your Medical Team:   Urologist -  Dr. Raynelle Bring, [facility]  Radiation Oncologist - Dr. Tyler Pita, [facility]   Medical Oncologist - Dr. Zola Button, [place]  Recommendations: 1) Repeat PSA  2) MRI/ Fusion Biopsy 3) Seed Implant vs radiation  * These recommendations are based on information available as of today's consult.      Recommendations may change depending on the results of further tests or exams.  Next Steps: 1) Have PSA drawn today at [facility]  2) Follow up as scheduled with Dr. Jeffie Pollock   When appointments need to be scheduled, you will be contacted by [facility] and/or [facility].  Questions?  Please do not hesitate to call Lance Rue, Lance Stafford, Lance Stafford, Lance Stafford at [phone] any questions or concerns.  Lance Stafford is your Oncology Nurse Navigator and is available to assist you while you're receiving your medical care at [facility].  Patient was given a folder with business cards of all [facility] [place] team members and a copy of the [place] Prevention sheet.

## 2016-06-18 NOTE — Progress Notes (Signed)
Radiation Oncology         [phone] ________________________________  Multidisciplinary Prostate Clinic   Initial Outpatient Consultation  Name: Lance Stafford MRN: [id number]  Date: 06/18/2016  DOB: 04-Oct-1945  EH:[facility] Suella [place], NP  Raynelle Bring, MD   REFERRING PHYSICIAN: Raynelle Bring, MD  DIAGNOSIS:  71 y.o. 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    ICD-9-CM ICD-10-CM   1. Malignant neoplasm of prostate ([place]) 185 C61   2. Prostate cancer [facility]) 185 C61     HISTORY OF PRESENT ILLNESS: Lance Stafford is a 71 y.o. male with a diagnosis of prostate cancer. He was noted to have an elevated PSA of 8.9 in January 2017.  Accordingly, he was referred for evaluation in urology by Dr. Jeffie Pollock.  The patient proceeded to transrectal ultrasound with 12 biopsies of the prostate in 05/2015 with a Gleason 3+3=6 in 1 core in the left mid lateral.  He had an MRI in 06/2015 revealing a 54m suspicious nodule in the right mid base peripheral.  He elected to proceed with active surveillence.  His PSA had increased to 9.7 in 08/2015 and came down to 9.4 12/2015.  PSA was further elevated at 11.9 in 03/2016 so he underwent repeat TRUSPBx on 04/16/16.  The prostate volume measured 29.7 cc.  Out of 12 core biopsies,1 were positive.  The maximum Gleason score was 3+3=6, and this was seen in right apex lateral.  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  The patient reviewed the biopsy results with his urologist and he has kindly been referred today in multidisciplinary prostate clinic for discussion of potential radiation treatment options.  03/19/16: 11.9 12/17/15: 9.4 09/05/15: 9.73 04/18/15: 8.99  PREVIOUS RADIATION THERAPY: No  PAST MEDICAL HISTORY:  Past Medical History:  Diagnosis Date  . Arthritis   . Chronic low back pain    truck driver  . GERD  (gastroesophageal reflux disease)   . Hyperlipidemia   . Spermatocele    left  . Wears glasses   . Wears partial dentures    upper and lower      PAST SURGICAL HISTORY: Past Surgical History:  Procedure Laterality Date  . CATARACT EXTRACTION W/ INTRAOCULAR LENS  IMPLANT, BILATERAL  2015  . SPERMATOCELECTOMY Left 01/30/2015   Procedure: SPERMATOCELECTOMY;  Surgeon: JIrine Seal MD;  [facility]  Service: Urology;  Laterality: Left;    FAMILY HISTORY:  Family History  Problem Relation Age of Onset  . Cancer Brother     prostate    SOCIAL HISTORY:  Social History   Social History  . Marital status: Married    Spouse name: N/A  . Number of children: N/A  . Years of education: N/A   Occupational History  . Not on file.   Social History Main Topics  . Smoking status: Former Smoker    Packs/day: 2.00    Years: 33.00    Types: Cigarettes    Quit date: 01/23/1991  . Smokeless tobacco: Never Used  . Alcohol use No  . Drug use: No  . Sexual activity: Yes   Other Topics Concern  . Not on file   Social History Narrative  . No narrative on file    ALLERGIES: Patient has no known allergies.  MEDICATIONS:  Current Outpatient Prescriptions  Medication Sig Dispense Refill  . aspirin  EC 81 MG tablet Take 81 mg by mouth daily.    Marland Kitchen atorvastatin (LIPITOR) 40 MG tablet Take 40 mg by mouth daily.    . nabumetone (RELAFEN) 500 MG tablet Take 500 mg by mouth 2 (two) times daily.     No current facility-administered medications for this encounter.     REVIEW OF SYSTEMS:  On review of systems, the patient reports that he is doing well overall. He denies any chest pain, shortness of breath, cough, fevers, chills, night sweats, unintended weight changes. He denies any bowel disturbances, and denies abdominal pain, nausea or vomiting. He denies any new musculoskeletal or joint aches or pains. His IPSS was 14, indicating mild-moderate urinary symptoms. He  reports some ED but is able to complete sexual activity with most attempts. A complete review of systems is obtained and is otherwise negative.    PHYSICAL EXAM:  Wt Readings from Last 3 Encounters:  06/18/16 228 lb 3.2 oz (103.5 kg)  01/30/15 234 lb 8 oz (106.4 kg)   Temp Readings from Last 3 Encounters:  04/16/16 97.7 F (36.5 C) (Oral)  05/30/15 97.7 F (36.5 C) (Oral)  01/30/15 97.6 F (36.4 C)   BP Readings from Last 3 Encounters:  06/18/16 (!) 138/92  04/16/16 (!) 130/98  05/30/15 (!) 136/94   Pulse Readings from Last 3 Encounters:  06/18/16 65  04/16/16 90  05/30/15 72   Pain Assessment Pain Score: 0-No pain/10  In general this is a well appearing caucasian male in no acute distress. He is alert and oriented x4 and appropriate throughout the examination. HEENT reveals that the patient is normocephalic, atraumatic. EOMs are intact. PERRLA. Skin is intact without any evidence of gross lesions. Cardiovascular exam reveals a regular rate and rhythm, no clicks rubs or murmurs are auscultated. Chest is clear to auscultation bilaterally. Lymphatic assessment is performed and does not reveal any adenopathy in the cervical, supraclavicular, axillary, or inguinal chains. Abdomen has active bowel sounds in all quadrants and is intact. The abdomen is soft, non tender, non distended. Lower extremities demonstrate 1+ bilateral pretibial pitting edema, but are negative for deep calf tenderness, cyanosis or clubbing.   KPS = 100  100 - Normal; no complaints; no evidence of disease. 90   - Able to carry on normal activity; minor signs or symptoms of disease. 80   - Normal activity with effort; some signs or symptoms of disease. 70   - Cares for self; unable to carry on normal activity or to do active work. 60   - Requires occasional assistance, but is able to care for most of his personal needs. 50   - Requires considerable assistance and frequent medical care. 28   - Disabled; requires  special care and assistance. 18   - Severely disabled; hospital admission is indicated although death not imminent. 39   - Very sick; hospital admission necessary; active supportive treatment necessary. 10   - Moribund; fatal processes progressing rapidly. 0     - Dead  Karnofsky DA, Abelmann [place], Craver LS and Burchenal [facility] [phone]) The use of the nitrogen mustards in the palliative treatment of carcinoma: with particular reference to bronchogenic carcinoma Cancer 1 634-56  LABORATORY DATA:  Lab Results  Component Value Date   HGB 16.3 01/30/2015   No results found for: NA, K, CL, CO2 No results found for: ALT, AST, GGT, ALKPHOS, BILITOT   RADIOGRAPHY: No results found.    IMPRESSION/PLAN: 1. 71 y.o. gentleman with Stage T1c adenocarcinoma  of the prostate with Gleason Score of 3+3=6, and PSA of 11.9. Today we reviewed the findings and workup thus far. Based on this information, he is a candidate for multiple options, including continuing active surveillance, prostatectomy and radiotherapy.  We discussed the natural history of prostate cancer.  We reviewed the the implications of T-stage, Gleason's Score, and PSA on decision-making and outcomes in prostate cancer.  We discussed radiation treatment in the management of prostate cancer with regard to the logistics and delivery of external beam radiation treatment as well as the logistics and delivery of prostate brachytherapy.  We compared and contrasted each of these approaches and also compared these against prostatectomy and active surveillance.  He had a repeat PSA 06/17/16 with his PCP and results are pending. We discussed that these results will help to guide further recommendations. If PSA is decreased, he would continue to be a good candidate for AS. We discussed the option of proceeding with repeat prostate MRI prior to further repeat TRUS PBx as a component of his active surveillance. However, if PSA continues to remain elevated or further  elevated, we would recommend pursuing treatment and feel that brachytherapy may be a more attractive option for him as this would be least disruptive to his lifestyle and occupation.    The patient would like to continue to consider his options at this point.  We will share our findings with Dr. Jeffie Pollock and await final decision from the patient regarding his treatment preference.     We enjoyed meeting with him today, and will look forward to participating in the care of this very nice gentleman.    Nicholos Johns, PA-C   And    Tyler Pita, MD [place] Director and Director of Stereotactic Radiosurgery Direct Dial: [phone]  Fax: [phone] [email or web address]  Skype  [email or web address]   This document serves as a record of services personally performed by Freeman Caldron, PA-C and Tyler Pita, MD. It was created on their behalf by Darcus Austin, a trained medical scribe. The creation of this record is based on the scribe's personal observations and the providers' statements to them. This document has been checked and approved by the attending provider.

## 2016-06-18 NOTE — Consult Note (Signed)
Multi-Disciplinary Clinic     06/18/2016   --------------------------------------------------------------------------------   Lance Stafford  MRN: [id number]  PRIMARY CARE:  Lance Lea. Weeks, NP  DOB: 10/23/1945, 71 year old Male  REFERRING:  Lance Lea. Weeks, NP  SSN: -[id number]  PROVIDER:  Irine Seal, M.D.    TREATING:  Raynelle Bring, M.D.    LOCATION:  [facility] [phone]   --------------------------------------------------------------------------------   CC/HPI: CC: Prostate Cancer   Physician requesting consult: Dr. Irine Seal  PCP: Haynes Hoehn, [facility]  Location of consult: [facility] - Prostate Cancer Multidisciplinary Clinic   [name] is a 71 year old who was found to have an elevated PSA of 8.99 prompting a TRUS biopsy of the prostate on 06/04/15 that confirmed Gleason 3+3=6 adenocarcinoma in 1 out of 12 biopsy cores (left mid lateral). He proceeded with active surveillance management after discussing options. An MRI of the prostate was performed on 07/03/15 and demonstrated a suspicious lesion at the right lateral mid/base gland. His PSA increased to 11.9 and he underwent a repeat TRUS biopsy on 04/16/16. This indicated Gleason 3+3=6 adenocarcinoma in 5% of 1 out of 12 biopsy cores positive (separate area from initial biopsy).   Family history: He does have a brother who was diagnosed with prostate cancer in his mid 53s and was treated with surgical therapy.   Imaging studies: MRI (07/03/15): No EPE, SVI, LNI.   PMH: He has a history of GERD and hypercholesterolemia.  PSH: No abdominal surgeries.   TNM stage: cT1c Nx Mx  PSA: 11.9  Gleason score: 3+3=6  Biopsy (04/16/16): 1/12 cores positive  Left: Benign  Right: R lateral apex (5%)  Prostate volume: 29.7 cc  PSAD: 0.40   Nomogram  OC disease: 59%  EPE: 40%  SVI: 1%  LNI: 1%  PFS (5 year, 10 year): 92%,85%   Urinary function: IPSS is 9.  Erectile function: SHIM score is 14. He states  that he can achieve erections adequate for intercourse most times without the need for medication.     ALLERGIES: No Allergies    MEDICATIONS: Aspirin Ec 81 mg tablet, delayed release Oral  Lipitor 40 mg tablet Oral  Zantac 150 mg tablet Oral     GU PSH: Prostate Needle Biopsy - 04/16/2016 Removal of spermatocele - 02/03/2015      PSH Notes: Surgery Excision Of Spermatocele With Epididymectomy, No Surgical Problems   NON-GU PSH: Surgical Pathology, Gross And Microscopic Examination For Prostate Needle - 04/16/2016    GU PMH: BPH w/LUTS (Stable), IPSS of 9. - 06/04/2016 Prostate Cancer (Worsening), He has T1c Nx Mx Gleason 6 low volume prostate cancer which would generally be very low to low risk based on the path but the PSA level and PSAD along with the PSADT is more consistent with intermediate risk disease and there was a small lesion with suspicion for high grade disease on the prior MRI. I have reviewed the treatment options as noted below and after reviewing the options will get him set up for the [facility] to further review his options. - 06/04/2016, - 03/26/2016, He has low risk disease. , - 12/26/2015, Prostate cancer, - 09/12/2015 Weak Urinary Stream - 06/04/2016 Elevated PSA, Elevated prostate specific antigen (PSA) - 09/12/2015 Spermatocele of epididymis, Unspec, Spermatocele - 05/09/2015    NON-GU PMH: Hypercholesterolemia, High cholesterol - 12/27/2014 Personal history of other diseases of the digestive system, History of esophageal reflux - 12/27/2014 Personal history of other diseases of  the musculoskeletal system and connective tissue, History of arthritis - 12/27/2014 Encounter for general adult medical examination without abnormal findings, Encounter for preventive health examination    FAMILY HISTORY: malignant neoplasm - Runs In Family Prostate Cancer - Runs In Family Strokes - Runs In Family   SOCIAL HISTORY: None    Notes: Caffeine use, Occupation, Former smoker, Alcohol use    REVIEW OF SYSTEMS:    GU Review Male:   Patient denies frequent urination, hard to postpone urination, burning/ pain with urination, get up at night to urinate, leakage of urine, stream starts and stops, trouble starting your streams, and have to strain to urinate .  Gastrointestinal (Lower):   Patient denies diarrhea and constipation.  Gastrointestinal (Upper):   Patient denies nausea and vomiting.  Constitutional:   Patient denies fever, night sweats, weight loss, and fatigue.  Skin:   Patient denies skin rash/ lesion and itching.  Eyes:   Patient denies blurred vision and double vision.  Ears/ Nose/ Throat:   Patient denies sore throat and sinus problems.  Hematologic/Lymphatic:   Patient denies swollen glands and easy bruising.  Cardiovascular:   Patient denies leg swelling and chest pains.  Respiratory:   Patient denies cough and shortness of breath.  Endocrine:   Patient denies excessive thirst.  Musculoskeletal:   Patient denies back pain and joint pain.  Neurological:   Patient denies headaches and dizziness.  Psychologic:   Patient denies depression and anxiety.   VITAL SIGNS: None   MULTI-SYSTEM PHYSICAL EXAMINATION:    Constitutional: Well-nourished. No physical deformities. Normally developed. Good grooming.     PAST DATA REVIEWED:  Source Of History:  Patient  Lab Test Review:   PSA  Records Review:   Pathology Reports  Urine Test Review:   Urinalysis  X-Ray Review: MRI Prostate [facility]: Reviewed Films. Findings are as dictated above.    03/19/16 03/19/16 12/17/15 09/05/15 04/18/15  PSA  Total PSA 11.9 ng/dl 11.9 ng/dl 9.4 ng/dl 9.73  8.99   Free PSA     1.52   % Free PSA     17     PROCEDURES: None   ASSESSMENT:      ICD-10 Details  1 GU:   Prostate Cancer - C61    PLAN:           Document Letter(s):  Created for Patient: Clinical Summary         Notes:   1. Clinically localized prostate cancer: We had a detailed discussion today regarding his prostate  cancer diagnosis and disease parameters. He understands that the major cause for concern is his PSA which is significantly elevated relative to his prostate volume causing his PSA density to be 0.4. He technically also meets the criteria for intermediate risk disease considering that his PSA is 11.9.   He understands a continued surveillance would appear to be with some increased risk compared to the information noted at the time of his initial diagnosis. However, on review of his pathology, there has not been any suggestion of higher risk disease. He does have an abnormality noted on his MRI from last March. He has not had disease noted in this area raising the possibility that this area has not been specifically sampled.   He has met with Dr. Tammi Klippel and Dr. Alen Blew earlier this morning and considering his prior discussions with Dr. Jeffie Pollock, he does have a very good understanding of his situation. He rightly is also concerned about his PSA level. We therefore  reviewed the pros and cons of options including continued active surveillance which would come with some slight increased risk based on the parameters discussed above. If he proceeds with surveillance, he likely would benefit from a repeat MRI in the next few months to compare with his MRI from last year and possibly in preparation for an MR/ultrasound fusion biopsy that might help to better target the area of concern noted on MR imaging.   We also discussed the option of curative treatment with an emphasis on surgical and radiation therapy. We reviewed the pros and cons of these approaches and the potential side effect profiles. The patient was counseled about the natural history of prostate cancer and the standard treatment options that are available for prostate cancer. It was explained to him how his age and life expectancy, clinical stage, Gleason score, and PSA affect his prognosis, the decision to proceed with additional staging studies, as well as  how that information influences recommended treatment strategies. We discussed the roles for active surveillance, radiation therapy, surgical therapy, androgen deprivation, as well as ablative therapy options for the treatment of prostate cancer as appropriate to his individual cancer situation. We discussed the risks and benefits of these options with regard to their impact on cancer control and also in terms of potential adverse events, complications, and impact on quality of life particularly related to urinary and sexual function. The patient was encouraged to ask questions throughout the discussion today and all questions were answered to his stated satisfaction. In addition, the patient was provided with and/or directed to appropriate resources and literature for further education about prostate cancer and treatment options.   Currently, he would like to have his PSA repeated and this will be arranged at our office following his clinic visit this morning. He will give consideration to his options and will notify Dr. Jeffie Pollock of his choice pending his PSA result.   Cc: Dr. Irine Seal  Haynes Hoehn, NP         E & M CODE: I spent at least 40 minutes face to face with the patient, more than 50% of that time was spent on counseling and/or coordinating care.

## 2016-06-18 NOTE — Progress Notes (Signed)
Reason for Referral: Prostate cancer.   HPI: [name] is a 71 year old gentleman currently resides in [place]. He is a gentleman with history of hyperlipidemia and arthritis for the most part it reasonable health. He was noted to have an elevated PSA on routine PSA screening and it was noted to have PSA of 8.99 in 2017. At that time he was evaluated by Dr. Jeffie Pollock had a biopsy obtained on February 2017 showed a Gleason score 3+3 = 6 in one core that showed 10% tumor involvement. He did have an MRI subsequently which showed a possible 5 mm lesion in the lateral right peripheral gland mid gland to base which could reflect high-grade microscopic prostate cancer. This was documented in March 2017. A repeat PSA in December 2017 was elevated to 11.9. A repeat biopsy done on 04/19/2016 showed a Gleason score 3+3 = 6 in 5% of the right apex. Patient was referred to prostate cancer multidisciplinary clinic for discussion regarding his options.  Clinically, he has no complaints at this time. He continues to be active and performs activities of daily living. He does work part-time as a Geophysicist/field seismologist. He denied any urinary symptoms of hematuria or dysuria. He denied any joint pain or discomfort.  He does not report any headaches, blurry vision, syncope or seizures. He does not report any fevers or chills or sweats. He does not report any cough, wheezing or hemoptysis. He does not report any nausea, vomiting or abdominal pain. He is not reporting frequency urgency or hesitancy. He does not believe skeletal complaints. Remaining review of systems unremarkable.   Past Medical History:  Diagnosis Date  . Arthritis   . Chronic low back pain    truck driver  . GERD (gastroesophageal reflux disease)   . Hyperlipidemia   . Spermatocele    left  . Wears glasses   . Wears partial dentures    upper and lower  :  Past Surgical History:  Procedure Laterality Date  . CATARACT EXTRACTION W/ INTRAOCULAR LENS  IMPLANT,  BILATERAL  2015  . SPERMATOCELECTOMY Left 01/30/2015   Procedure: SPERMATOCELECTOMY;  Surgeon: Irine Seal, MD;  Location: [facility];  Service: Urology;  Laterality: Left;  :   Current Outpatient Prescriptions:  .  aspirin EC 81 MG tablet, Take 81 mg by mouth daily., Disp: , Rfl:  .  atorvastatin (LIPITOR) 40 MG tablet, Take 40 mg by mouth daily., Disp: , Rfl:  .  nabumetone (RELAFEN) 500 MG tablet, Take 500 mg by mouth 2 (two) times daily., Disp: , Rfl: :  No Known Allergies:  Family History  Problem Relation Age of Onset  . Cancer Brother     prostate  :  Social History   Social History  . Marital status: Married    Spouse name: N/A  . Number of children: N/A  . Years of education: N/A   Occupational History  . Not on file.   Social History Main Topics  . Smoking status: Former Smoker    Packs/day: 2.00    Years: 33.00    Types: Cigarettes    Quit date: 01/23/1991  . Smokeless tobacco: Never Used  . Alcohol use No  . Drug use: No  . Sexual activity: Yes   Other Topics Concern  . Not on file   Social History Narrative  . No narrative on file  :  Pertinent items are noted in HPI.  Exam: ECOG 0.  General appearance: Well-appearing gentleman appeared without distress. Throat: No oral  thrush or ulcers. Neck: no adenopathy Back: negative Resp: clear to auscultation bilaterally Cardio: regular rate and rhythm, S1, S2 normal, no murmur, click, rub or gallop GI: soft, non-tender; bowel sounds normal; no masses,  no organomegaly Extremities: extremities normal, atraumatic, no cyanosis or edema Skin: Skin color, texture, turgor normal. No rashes or lesions Lymph nodes: Cervical, supraclavicular, and axillary nodes normal.    Assessment and Plan:   71 year old gentleman with prostate cancer. He was diagnosed in February 2017 when he presented with a PSA of 8.99 and underwent a biopsy that showed a Gleason score of 6 in 1 core on the left side.  He opted for observation and surveillance at that time and a repeat PSA in December 2017 was 11.9 and a biopsy showed recent score 3+3 = 6 at the right apex.  His case was discussed today in the prostate cancer multidisciplinary clinic. These findings were reviewed with the patient today. There appears to be some discordance between his biopsy in his PSA. It is unclear the etiology of this discordance and could be a sign of a more high-grade cancer that was failed to be biopsied or was not represented on his pathology sample. The other option that his PSA is elevated for different reasons and does not reflect his true prostate cancer.  He did have a repeat PSA done with his primary care provider in the last 24 hours. If his PSA continues to be high then definitive treatment is reasonable at this time. This treatment will be in the form of radiation therapy or primary surgical therapy. If his PSA went repeated is actually lower than 10, it is reasonable to continue with observation and surveillance and repeat MRI and potentially MRI fusion biopsy at that time.  I see no role for any systemic therapy at this time given the early stage low-grade cancer that he likely has. All his questions were answered today to his satisfaction.

## 2016-06-18 NOTE — Progress Notes (Signed)
[facility] Psychosocial Distress Screening Spiritual Care  Met with Lance Stafford in [facility] to introduce [facility] team/resources, reviewing distress screen per protocol.  The patient scored a 5 on the Psychosocial Distress Thermometer which indicates moderate distress. Also assessed for distress and other psychosocial needs.   ONCBCN DISTRESS SCREENING 06/18/2016  Screening Type Initial Screening  Distress experienced in past week (1-10) 5  Emotional problem type Adjusting to illness  Spiritual/Religous concerns type Relating to God  Information Concerns Type Lack of info about treatment  Physical Problem type Changes in urination;Sexual problems  Referral to support programs Yes   Lance Stafford shared at length about sources of meaning and enjoyment for him (classic cars, motorcycles, family).  Per pt, PMDC was very helpful in beginning to clarify his situation, assisting him with developing a plan, and creating appreciative confidence in his team.  Per pt, he checked "relating to God" on his screen because he was having trouble discerning how to respond to his dx, but that is resolving with the help of prayer, PMDC, and inward knowing.  Because he lives in [place], [facility] programming is less convenient for him, but he does travel to [place] regularly and plans to explore opportunities that coincide with these visits.  He is also aware that we can set up phone conversations and explore other resources that may be available at a distance.  Follow up needed: No.  Pt has full packet of [facility] team/programming information and plans to reach out as desired.  Per pt, no other needs at this time.  Please page if immediate needs arise.  Thank you.   [place], [place], [facility] Pager [phone] Voicemail [phone]

## 2016-07-01 ENCOUNTER — Telehealth: Payer: Self-pay | Admitting: Urology

## 2016-07-01 NOTE — Telephone Encounter (Signed)
I called and spoke with patient's wife, Joycelyn Schmid, to confirm that Mr. Recore has decided to move forward with prostate brachytherapy for his prostate cancer.  Mr. Tomes is away from the house but she was able too confirm this decision. They met with Dr. Jeffie Pollock on Monday, 06/28/16 and shared this decision with him as well.  I informed her that we will begin coordinating the OR schedule as well as move forward with scheduling a Pre-seed appointment for CT Pubic Arch study and will be back in contact soon.  She was very appreciative of the call.    Nicholos Johns, PA-C

## 2016-07-05 NOTE — Progress Notes (Signed)
  Radiation Oncology         [phone]) [phone] ________________________________  Name: Lance Stafford MRN: [id number]  Date: 07/08/2016  DOB: 1945-10-01  SIMULATION AND TREATMENT PLANNING NOTE PUBIC ARCH STUDY  VA:CQPEA Suella Grove, NP  Raynelle Bring, MD  DIAGNOSIS: 71 y.o. 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     ICD-9-CM ICD-10-CM   1. Prostate cancer ([place]) [place]:  The patient presented today for evaluation for possible prostate seed implant. He was brought to the radiation planning suite and placed supine on the CT couch. A 3-dimensional image study set was obtained in upload to the planning computer. There, on each axial slice, I contoured the prostate gland. Then, using three-dimensional radiation planning tools I reconstructed the prostate in view of the structures from the transperineal needle pathway to assess for possible pubic arch interference. In doing so, I did not appreciate any pubic arch interference. Also, the patient's prostate volume was estimated based on the drawn structure. The volume was 35 cc.  Given the pubic arch appearance and prostate volume, patient remains a good candidate to proceed with prostate seed implant. Today, he freely provided informed written consent to proceed.    PLAN: The patient will undergo prostate seed implant.   ________________________________  Sheral Apley. Tammi Klippel, M.D.

## 2016-07-07 ENCOUNTER — Telehealth: Payer: Self-pay | Admitting: *Deleted

## 2016-07-07 NOTE — Telephone Encounter (Signed)
CALLED PATIENT TO REMIND OF PRESEED APPTS. FOR 07-08-16, SPOKE WITH PATIENT'S WIFE MARGARET AND SHE IS AWARE OF THESE APPTS.

## 2016-07-08 ENCOUNTER — Ambulatory Visit
Admission: RE | Admit: 2016-07-08 | Discharge: 2016-07-08 | Disposition: A | Discharge status: Home / Self Care | Payer: Medicare Other | Source: Ambulatory Visit | Attending: Radiation Oncology | Admitting: Radiation Oncology

## 2016-07-08 ENCOUNTER — Inpatient Hospital Stay
Admission: RE | Admit: 2016-07-08 | Discharge: 2016-07-08 | Disposition: A | Discharge status: Home / Self Care | Payer: Medicare Other | Source: Ambulatory Visit | Attending: Radiation Oncology | Admitting: Radiation Oncology

## 2016-07-08 DIAGNOSIS — C61 Malignant neoplasm of prostate: Secondary | ICD-10-CM | POA: Diagnosis present

## 2016-07-13 ENCOUNTER — Telehealth: Payer: Self-pay | Admitting: *Deleted

## 2016-07-13 ENCOUNTER — Other Ambulatory Visit: Payer: Self-pay | Admitting: Urology

## 2016-07-13 NOTE — Telephone Encounter (Signed)
Called patient to inform of implant date, spoke with patient and he is aware of this date. 

## 2016-07-16 ENCOUNTER — Other Ambulatory Visit: Payer: Self-pay

## 2016-07-16 ENCOUNTER — Ambulatory Visit (HOSPITAL_BASED_OUTPATIENT_CLINIC_OR_DEPARTMENT_OTHER)
Admission: RE | Admit: 2016-07-16 | Discharge: 2016-07-16 | Disposition: A | Discharge status: Home / Self Care | Payer: Medicare Other | Source: Ambulatory Visit | Attending: Urology | Admitting: Urology

## 2016-07-16 ENCOUNTER — Encounter (HOSPITAL_BASED_OUTPATIENT_CLINIC_OR_DEPARTMENT_OTHER)
Admission: RE | Admit: 2016-07-16 | Discharge: 2016-07-16 | Disposition: A | Discharge status: Home / Self Care | Payer: Medicare Other | Source: Ambulatory Visit | Attending: Urology | Admitting: Urology

## 2016-07-16 DIAGNOSIS — C61 Malignant neoplasm of prostate: Secondary | ICD-10-CM | POA: Insufficient documentation

## 2016-07-16 DIAGNOSIS — Z01818 Encounter for other preprocedural examination: Secondary | ICD-10-CM | POA: Insufficient documentation

## 2016-07-16 DIAGNOSIS — R918 Other nonspecific abnormal finding of lung field: Secondary | ICD-10-CM | POA: Diagnosis not present

## 2016-07-27 ENCOUNTER — Telehealth: Payer: Self-pay | Admitting: Medical Oncology

## 2016-07-27 NOTE — Progress Notes (Signed)
I spoke with Mr. Stick and he is doing well. He had his CT simulation 3/29 and is scheduled for his seed implant on 09/30/16. I wished him well and asked him to call me with any questions or concerns. He voiced understanding.

## 2016-08-04 ENCOUNTER — Telehealth: Payer: Self-pay | Admitting: *Deleted

## 2016-08-04 NOTE — Telephone Encounter (Signed)
Patient to have this test on 10-21-16 @ 12 pm @ [facility]

## 2016-09-22 ENCOUNTER — Telehealth: Payer: Self-pay | Admitting: *Deleted

## 2016-09-22 NOTE — Telephone Encounter (Signed)
CALLED PATIENT TO REMIND OF LABS FOR 09-23-16 FOR IMPLANT ON 09-30-16, SPOKE WITH PATIENT AND HE IS AWARE OF THIS APPT.

## 2016-09-23 ENCOUNTER — Encounter (HOSPITAL_BASED_OUTPATIENT_CLINIC_OR_DEPARTMENT_OTHER): Payer: Self-pay | Admitting: *Deleted

## 2016-09-23 DIAGNOSIS — E7800 Pure hypercholesterolemia, unspecified: Secondary | ICD-10-CM | POA: Diagnosis not present

## 2016-09-23 DIAGNOSIS — K219 Gastro-esophageal reflux disease without esophagitis: Secondary | ICD-10-CM | POA: Diagnosis not present

## 2016-09-23 DIAGNOSIS — Z87891 Personal history of nicotine dependence: Secondary | ICD-10-CM | POA: Diagnosis not present

## 2016-09-23 DIAGNOSIS — Z791 Long term (current) use of non-steroidal anti-inflammatories (NSAID): Secondary | ICD-10-CM | POA: Diagnosis not present

## 2016-09-23 DIAGNOSIS — Z79899 Other long term (current) drug therapy: Secondary | ICD-10-CM | POA: Diagnosis not present

## 2016-09-23 DIAGNOSIS — Z7982 Long term (current) use of aspirin: Secondary | ICD-10-CM | POA: Diagnosis not present

## 2016-09-23 DIAGNOSIS — C61 Malignant neoplasm of prostate: Secondary | ICD-10-CM | POA: Diagnosis present

## 2016-09-23 LAB — COMPREHENSIVE METABOLIC PANEL
ALK PHOS: 70 U/L (ref 38–126)
ALT: 16 U/L — ABNORMAL LOW (ref 17–63)
AST: 22 U/L (ref 15–41)
Albumin: 4.1 g/dL (ref 3.5–5.0)
Anion gap: 9 (ref 5–15)
BILIRUBIN TOTAL: 1.2 mg/dL (ref 0.3–1.2)
BUN: 13 mg/dL (ref 6–20)
CALCIUM: 9.2 mg/dL (ref 8.9–10.3)
CO2: 28 mmol/L (ref 22–32)
Chloride: 103 mmol/L (ref 101–111)
Creatinine, Ser: 1.03 mg/dL (ref 0.61–1.24)
GFR calc Af Amer: >60 mL/min (ref >60)
GLUCOSE: 110 mg/dL — AB (ref 65–99)
POTASSIUM: 5 mmol/L (ref 3.5–5.1)
Sodium: 140 mmol/L (ref 135–145)
TOTAL PROTEIN: 7.2 g/dL (ref 6.5–8.1)

## 2016-09-23 LAB — CBC
HEMATOCRIT: 47.4 % (ref 39.0–52.0)
HEMOGLOBIN: 16.4 g/dL (ref 13.0–17.0)
MCH: 32.9 pg (ref 26.0–34.0)
MCHC: 34.6 g/dL (ref 30.0–36.0)
MCV: 95.2 fL (ref 78.0–100.0)
Platelets: 200 10*3/uL (ref 150–400)
RBC: 4.98 MIL/uL (ref 4.22–5.81)
RDW: 12.4 % (ref 11.5–15.5)
WBC: 5.4 10*3/uL (ref 4.0–10.5)

## 2016-09-23 LAB — PROTIME-INR
INR: 0.99
PROTHROMBIN TIME: 13.1 s (ref 11.4–15.2)

## 2016-09-23 LAB — APTT: aPTT: 27 seconds (ref 24–36)

## 2016-09-23 NOTE — Progress Notes (Signed)
NPO AFTER MN.  ARRIVE AT 0800.  CURRENT CXR, EKG, AND LAB WORK DONE TODAY IN CHART AND EPIC.  WILL DO FLEET ENEMA AM DOS.

## 2016-09-29 ENCOUNTER — Telehealth: Payer: Self-pay | Admitting: *Deleted

## 2016-09-29 NOTE — Telephone Encounter (Signed)
Called patient to remind of procedure for 09-30-16, spoke with patient and he is aware of this procedure

## 2016-09-29 NOTE — H&P (Signed)
CC: I have prostate cancer.  HPI: Lance Stafford is a 71 year-old male established patient who is here evaluation for treatment of prostate cancer.  His repeat PSA prior to this visit was up to 13.6 on 06/18/16. He had only a single core of Gleason 6 disease with 5% at the right apex but his PSA rise has been more worrisome. He has BPH with BOO and an IPSS of 14 with a prostate volume of 86ml. He is most interested in brachytherapy. He has associated ED.   Pt here for pre-procedure visit prior to undergoing brachytherapy seed placement on 6/21. Denies recent fevers or infections. Voiding symptoms stable.   He does have the pathology report from his biopsy.   He has not undergone surgery for treatment. He has not undergone External Beam Radiation Therapy for treatment. He has not undergone Hormonal Therapy for treatment.   He has not recently had unwanted weight loss. He is not having pain in new locations.     ALLERGIES: No Allergies    MEDICATIONS: Aspirin Ec 81 mg tablet, delayed release Oral  Lipitor 40 mg tablet Oral  Zantac 150 mg tablet Oral     GU PSH: Prostate Needle Biopsy - 04/16/2016 Removal of spermatocele - 02/03/2015      PSH Notes: Surgery Excision Of Spermatocele With Epididymectomy, No Surgical Problems   NON-GU PSH: Surgical Pathology, Gross And Microscopic Examination For Prostate Needle - 04/16/2016    GU PMH: BPH w/LUTS (Stable), IPSS of 9. - 06/04/2016 Prostate Cancer ([place]), He has T1c Nx Mx Gleason 6 low volume prostate cancer which would generally be very low to low risk based on the path but the PSA level and PSAD along with the PSADT is more consistent with intermediate risk disease and there was a small lesion with suspicion for high grade disease on the prior MRI. I have reviewed the treatment options as noted below and after reviewing the options will get him set up for the [facility] to further review his options. - 06/04/2016, - 03/26/2016, He has low risk  disease. , - 12/26/2015, Prostate cancer, - 09/12/2015 Weak Urinary Stream - 06/04/2016 Elevated PSA, Elevated prostate specific antigen (PSA) - 09/12/2015 Spermatocele of epididymis, Unspec, Spermatocele - 05/09/2015    NON-GU PMH: Hypercholesterolemia, High cholesterol - 12/27/2014 Personal history of other diseases of the digestive system, History of esophageal reflux - 12/27/2014 Personal history of other diseases of the musculoskeletal system and connective tissue, History of arthritis - 12/27/2014 Encounter for general adult medical examination without abnormal findings, Encounter for preventive health examination    FAMILY HISTORY: malignant neoplasm - Runs In Family Prostate Cancer - Runs In Family Strokes - Runs In Family   SOCIAL HISTORY: Marital Status: Married     Notes: Caffeine use, Occupation, Former smoker, Alcohol use   REVIEW OF SYSTEMS:    GU Review Male:   Patient reports erection problems. Patient denies frequent urination, hard to postpone urination, burning/ pain with urination, get up at night to urinate, leakage of urine, stream starts and stops, trouble starting your stream, have to strain to urinate , and penile pain.  Gastrointestinal (Upper):   Patient reports indigestion/ heartburn. Patient denies nausea and vomiting.  Gastrointestinal (Lower):   Patient denies constipation and diarrhea.  Constitutional:   Patient denies fever, night sweats, weight loss, and fatigue.  Skin:   Patient denies skin rash/ lesion and itching.  Eyes:   Patient denies blurred vision and double vision.  Ears/ Nose/ Throat:  Patient denies sore throat and sinus problems.  Hematologic/Lymphatic:   Patient reports easy bruising. Patient denies swollen glands.  Cardiovascular:   Patient denies leg swelling and chest pains.  Respiratory:   Patient denies cough and shortness of breath.  Endocrine:   Patient denies excessive thirst.  Musculoskeletal:   Patient denies back pain and joint pain.   Neurological:   Patient denies headaches and dizziness.  Psychologic:   Patient denies depression and anxiety.   VITAL SIGNS:      09/20/2016 09:58 AM  BP 130/81 mmHg  Pulse 58 /min  Temperature 98.0 F / 37 C   MULTI-SYSTEM PHYSICAL EXAMINATION:    Constitutional: Well-nourished. No physical deformities. Normally developed. Good grooming.  Respiratory: No labored breathing, no use of accessory muscles. CTA.  Cardiovascular: Normal temperature, normal extremity pulses, no swelling, no varicosities. RRR w/o murmur  Lymphatic: No enlargement of neck, axillae, groin.  Skin: No paleness, no jaundice, no cyanosis. No lesion, no ulcer, no rash.  Neurologic / Psychiatric: Oriented to time, oriented to place, oriented to person. No depression, no anxiety, no agitation.  Gastrointestinal: No mass, no tenderness, no rigidity, non obese abdomen. No flank or suprapubic tenderness.  Musculoskeletal: Spine, ribs, pelvis no bilateral tenderness. Normal gait and station of head and neck.     PAST DATA REVIEWED:  Source Of History:  Patient  Lab Test Review:   PSA  Records Review:   Pathology Reports, Previous Patient Records  Urine Test Review:   Urinalysis   06/18/16 03/19/16 03/19/16 12/17/15 09/05/15 04/18/15  PSA  Total PSA 13.60 ng/dl 11.9 ng/dl 11.9 ng/dl 9.4 ng/dl 9.73  8.99   Free PSA      1.52   % Free PSA      17     PROCEDURES:          Urinalysis Dipstick Dipstick Cont'd  Color: Yellow Bilirubin: Neg  Appearance: Clear Ketones: Neg  Specific Gravity: 1.010 Blood: Neg  pH: 6.0 Protein: Neg  Glucose: Neg Urobilinogen: 0.2    Nitrites: Neg    Leukocyte Esterase: Neg    ASSESSMENT:      ICD-10 Details  1 GU:   Prostate Cancer - C61    PLAN:           Orders Labs Urine Culture          Schedule Return Visit/Planned Activity: Keep Scheduled Appointment - Schedule Surgery          Document Letter(s):  Created for Patient: Clinical Summary         Notes:   No  perceived issues to prevent moving forward with brachytherapy. All questions answered to the best of my ability. I'll see him back in f/u after seed placement. UA sent for pre-op c/s today.

## 2016-09-30 ENCOUNTER — Ambulatory Visit (HOSPITAL_BASED_OUTPATIENT_CLINIC_OR_DEPARTMENT_OTHER): Payer: Medicare Other | Admitting: Anesthesiology

## 2016-09-30 ENCOUNTER — Encounter (HOSPITAL_BASED_OUTPATIENT_CLINIC_OR_DEPARTMENT_OTHER): Payer: Self-pay | Admitting: *Deleted

## 2016-09-30 ENCOUNTER — Ambulatory Visit (HOSPITAL_COMMUNITY): Payer: Medicare Other

## 2016-09-30 ENCOUNTER — Ambulatory Visit (HOSPITAL_BASED_OUTPATIENT_CLINIC_OR_DEPARTMENT_OTHER)
Admission: RE | Admit: 2016-09-30 | Discharge: 2016-09-30 | Disposition: A | Discharge status: Home / Self Care | Payer: Medicare Other | Source: Ambulatory Visit | Attending: Urology | Admitting: Urology

## 2016-09-30 ENCOUNTER — Encounter (HOSPITAL_BASED_OUTPATIENT_CLINIC_OR_DEPARTMENT_OTHER)
Admission: RE | Disposition: A | Discharge status: Home / Self Care | Payer: Self-pay | Source: Ambulatory Visit | Attending: Urology

## 2016-09-30 DIAGNOSIS — K219 Gastro-esophageal reflux disease without esophagitis: Secondary | ICD-10-CM | POA: Insufficient documentation

## 2016-09-30 DIAGNOSIS — Z791 Long term (current) use of non-steroidal anti-inflammatories (NSAID): Secondary | ICD-10-CM | POA: Insufficient documentation

## 2016-09-30 DIAGNOSIS — Z7982 Long term (current) use of aspirin: Secondary | ICD-10-CM | POA: Diagnosis not present

## 2016-09-30 DIAGNOSIS — Z79899 Other long term (current) drug therapy: Secondary | ICD-10-CM | POA: Insufficient documentation

## 2016-09-30 DIAGNOSIS — Z87891 Personal history of nicotine dependence: Secondary | ICD-10-CM | POA: Insufficient documentation

## 2016-09-30 DIAGNOSIS — C61 Malignant neoplasm of prostate: Secondary | ICD-10-CM | POA: Insufficient documentation

## 2016-09-30 DIAGNOSIS — E7800 Pure hypercholesterolemia, unspecified: Secondary | ICD-10-CM | POA: Insufficient documentation

## 2016-09-30 HISTORY — PX: RADIOACTIVE SEED IMPLANT: SHX5150

## 2016-09-30 HISTORY — DX: Malignant neoplasm of prostate: C61

## 2016-09-30 HISTORY — PX: CYSTOSCOPY: SHX5120

## 2016-09-30 HISTORY — DX: Nocturia: R35.1

## 2016-09-30 SURGERY — INSERTION, RADIATION SOURCE, PROSTATE
Anesthesia: General | Site: Prostate

## 2016-09-30 MED ORDER — SUGAMMADEX SODIUM 200 MG/2ML IV SOLN
INTRAVENOUS | Status: DC | PRN
  Administered 2016-09-30: 200 mg via INTRAVENOUS

## 2016-09-30 MED ORDER — SODIUM CHLORIDE 0.9% FLUSH
3.0000 mL | Freq: Two times a day (BID) | INTRAVENOUS | Status: DC
  Filled 2016-09-30: qty 3

## 2016-09-30 MED ORDER — SUCCINYLCHOLINE CHLORIDE 200 MG/10ML IV SOSY
PREFILLED_SYRINGE | INTRAVENOUS | Status: AC
  Filled 2016-09-30: qty 10

## 2016-09-30 MED ORDER — ROCURONIUM BROMIDE 50 MG/5ML IV SOSY
PREFILLED_SYRINGE | INTRAVENOUS | Status: DC | PRN
  Administered 2016-09-30: 30 mg via INTRAVENOUS
  Administered 2016-09-30: 20 mg via INTRAVENOUS

## 2016-09-30 MED ORDER — MEPERIDINE HCL 25 MG/ML IJ SOLN
6.2500 mg | INTRAMUSCULAR | Status: DC | PRN
  Filled 2016-09-30: qty 1

## 2016-09-30 MED ORDER — CIPROFLOXACIN IN D5W 400 MG/200ML IV SOLN
INTRAVENOUS | Status: AC
  Filled 2016-09-30: qty 200

## 2016-09-30 MED ORDER — FENTANYL CITRATE (PF) 100 MCG/2ML IJ SOLN
INTRAMUSCULAR | Status: AC
  Filled 2016-09-30: qty 2

## 2016-09-30 MED ORDER — SODIUM CHLORIDE 0.9 % IV SOLN
250.0000 mL | INTRAVENOUS | Status: DC | PRN
  Filled 2016-09-30: qty 250

## 2016-09-30 MED ORDER — FENTANYL CITRATE (PF) 100 MCG/2ML IJ SOLN
INTRAMUSCULAR | Status: DC | PRN
  Administered 2016-09-30 (×4): 50 ug via INTRAVENOUS

## 2016-09-30 MED ORDER — CIPROFLOXACIN IN D5W 400 MG/200ML IV SOLN
400.0000 mg | INTRAVENOUS | Status: AC
  Administered 2016-09-30: 400 mg via INTRAVENOUS
  Filled 2016-09-30: qty 200

## 2016-09-30 MED ORDER — PROPOFOL 10 MG/ML IV BOLUS
INTRAVENOUS | Status: AC
  Filled 2016-09-30: qty 20

## 2016-09-30 MED ORDER — KETOROLAC TROMETHAMINE 30 MG/ML IJ SOLN
INTRAMUSCULAR | Status: AC
  Filled 2016-09-30: qty 1

## 2016-09-30 MED ORDER — ONDANSETRON HCL 4 MG/2ML IJ SOLN
INTRAMUSCULAR | Status: AC
  Filled 2016-09-30: qty 2

## 2016-09-30 MED ORDER — ACETAMINOPHEN 325 MG PO TABS
650.0000 mg | ORAL_TABLET | ORAL | Status: DC | PRN
  Filled 2016-09-30: qty 2

## 2016-09-30 MED ORDER — FENTANYL CITRATE (PF) 100 MCG/2ML IJ SOLN
25.0000 ug | INTRAMUSCULAR | Status: DC | PRN
  Filled 2016-09-30: qty 1

## 2016-09-30 MED ORDER — ONDANSETRON HCL 4 MG/2ML IJ SOLN
INTRAMUSCULAR | Status: DC | PRN
  Administered 2016-09-30: 4 mg via INTRAVENOUS

## 2016-09-30 MED ORDER — DEXAMETHASONE SODIUM PHOSPHATE 10 MG/ML IJ SOLN
INTRAMUSCULAR | Status: AC
  Filled 2016-09-30: qty 1

## 2016-09-30 MED ORDER — IOHEXOL 300 MG/ML  SOLN
INTRAMUSCULAR | Status: DC | PRN
  Administered 2016-09-30: 7 mL

## 2016-09-30 MED ORDER — LIDOCAINE 2% (20 MG/ML) 5 ML SYRINGE
INTRAMUSCULAR | Status: AC
  Filled 2016-09-30: qty 5

## 2016-09-30 MED ORDER — MORPHINE SULFATE (PF) 2 MG/ML IV SOLN
2.0000 mg | INTRAVENOUS | Status: DC | PRN
  Filled 2016-09-30: qty 1

## 2016-09-30 MED ORDER — ACETAMINOPHEN 10 MG/ML IV SOLN
INTRAVENOUS | Status: DC | PRN
  Administered 2016-09-30: 1000 mg via INTRAVENOUS

## 2016-09-30 MED ORDER — HYDROCODONE-ACETAMINOPHEN 5-325 MG PO TABS: 1.0000 | ORAL_TABLET | Freq: Four times a day (QID) | ORAL | 0 refills | Status: DC | PRN

## 2016-09-30 MED ORDER — PROPOFOL 10 MG/ML IV BOLUS
INTRAVENOUS | Status: DC | PRN
  Administered 2016-09-30: 200 mg via INTRAVENOUS
  Administered 2016-09-30: 20 mg via INTRAVENOUS
  Administered 2016-09-30: 30 mg via INTRAVENOUS

## 2016-09-30 MED ORDER — SODIUM CHLORIDE 0.9 % IJ SOLN
INTRAMUSCULAR | Status: DC | PRN
  Administered 2016-09-30: 10 mL

## 2016-09-30 MED ORDER — ACETAMINOPHEN 650 MG RE SUPP
650.0000 mg | RECTAL | Status: DC | PRN
  Filled 2016-09-30: qty 1

## 2016-09-30 MED ORDER — LIDOCAINE 2% (20 MG/ML) 5 ML SYRINGE
INTRAMUSCULAR | Status: DC | PRN
  Administered 2016-09-30: 100 mg via INTRAVENOUS

## 2016-09-30 MED ORDER — PHENYLEPHRINE 40 MCG/ML (10ML) SYRINGE FOR IV PUSH (FOR BLOOD PRESSURE SUPPORT)
PREFILLED_SYRINGE | INTRAVENOUS | Status: AC
  Filled 2016-09-30: qty 10

## 2016-09-30 MED ORDER — MIDAZOLAM HCL 2 MG/2ML IJ SOLN
INTRAMUSCULAR | Status: AC
  Filled 2016-09-30: qty 2

## 2016-09-30 MED ORDER — LACTATED RINGERS IV SOLN
INTRAVENOUS | Status: DC
  Administered 2016-09-30 (×2): via INTRAVENOUS
  Filled 2016-09-30: qty 1000

## 2016-09-30 MED ORDER — OXYCODONE HCL 5 MG PO TABS
5.0000 mg | ORAL_TABLET | ORAL | Status: DC | PRN
  Filled 2016-09-30: qty 2

## 2016-09-30 MED ORDER — FLEET ENEMA 7-19 GM/118ML RE ENEM
1.0000 | ENEMA | Freq: Once | RECTAL | Status: AC
  Administered 2016-09-30: 1 via RECTAL
  Filled 2016-09-30: qty 1

## 2016-09-30 MED ORDER — SUCCINYLCHOLINE CHLORIDE 200 MG/10ML IV SOSY
PREFILLED_SYRINGE | INTRAVENOUS | Status: DC | PRN
  Administered 2016-09-30: 120 mg via INTRAVENOUS

## 2016-09-30 MED ORDER — DEXAMETHASONE SODIUM PHOSPHATE 4 MG/ML IJ SOLN
INTRAMUSCULAR | Status: DC | PRN
  Administered 2016-09-30: 10 mg via INTRAVENOUS

## 2016-09-30 MED ORDER — METOCLOPRAMIDE HCL 5 MG/ML IJ SOLN
10.0000 mg | Freq: Once | INTRAMUSCULAR | Status: DC | PRN
  Filled 2016-09-30: qty 2

## 2016-09-30 MED ORDER — KETOROLAC TROMETHAMINE 30 MG/ML IJ SOLN
INTRAMUSCULAR | Status: DC | PRN
  Administered 2016-09-30: 30 mg via INTRAVENOUS

## 2016-09-30 MED ORDER — ROCURONIUM BROMIDE 50 MG/5ML IV SOSY
PREFILLED_SYRINGE | INTRAVENOUS | Status: AC
  Filled 2016-09-30: qty 5

## 2016-09-30 MED ORDER — MIDAZOLAM HCL 5 MG/5ML IJ SOLN
INTRAMUSCULAR | Status: DC | PRN
  Administered 2016-09-30: 2 mg via INTRAVENOUS

## 2016-09-30 MED ORDER — SODIUM CHLORIDE 0.9% FLUSH
3.0000 mL | INTRAVENOUS | Status: DC | PRN
  Filled 2016-09-30: qty 3

## 2016-09-30 MED ORDER — EPHEDRINE SULFATE-NACL 50-0.9 MG/10ML-% IV SOSY
PREFILLED_SYRINGE | INTRAVENOUS | Status: DC | PRN
  Administered 2016-09-30 (×2): 10 mg via INTRAVENOUS

## 2016-09-30 MED ORDER — PHENYLEPHRINE 40 MCG/ML (10ML) SYRINGE FOR IV PUSH (FOR BLOOD PRESSURE SUPPORT)
PREFILLED_SYRINGE | INTRAVENOUS | Status: DC | PRN
  Administered 2016-09-30 (×2): 80 ug via INTRAVENOUS

## 2016-09-30 MED ORDER — EPHEDRINE 5 MG/ML INJ
INTRAVENOUS | Status: AC
  Filled 2016-09-30: qty 10

## 2016-09-30 MED ORDER — CIPROFLOXACIN HCL 500 MG PO TABS: 500.0000 mg | ORAL_TABLET | Freq: Two times a day (BID) | ORAL | 0 refills | Status: DC

## 2016-09-30 MED ORDER — SUGAMMADEX SODIUM 200 MG/2ML IV SOLN
INTRAVENOUS | Status: AC
  Filled 2016-09-30: qty 2

## 2016-09-30 SURGICAL SUPPLY — 33 items
BAG URINE DRAINAGE (UROLOGICAL SUPPLIES) ×4 IMPLANT
BLADE CLIPPER SURG (BLADE) ×4 IMPLANT
CATH FOLEY 2WAY SLVR  5CC 16FR (CATHETERS) ×2
CATH FOLEY 2WAY SLVR 5CC 16FR (CATHETERS) ×2 IMPLANT
CATH ROBINSON RED A/P 20FR (CATHETERS) ×4 IMPLANT
CLOTH BEACON ORANGE TIMEOUT ST (SAFETY) ×4 IMPLANT
COVER BACK TABLE 60X90IN (DRAPES) ×4 IMPLANT
COVER MAYO STAND STRL (DRAPES) ×4 IMPLANT
DRSG TEGADERM 4X4.75 (GAUZE/BANDAGES/DRESSINGS) ×4 IMPLANT
DRSG TEGADERM 8X12 (GAUZE/BANDAGES/DRESSINGS) ×4 IMPLANT
GAUZE SPONGE 4X4 12PLY STRL LF (GAUZE/BANDAGES/DRESSINGS) ×4 IMPLANT
GLOVE BIO SURGEON STRL SZ7.5 (GLOVE) ×16 IMPLANT
GLOVE ECLIPSE 8.0 STRL XLNG CF (GLOVE) IMPLANT
GLOVE SURG SS PI 8.0 STRL IVOR (GLOVE) ×8 IMPLANT
GOWN STRL REUS W/ TWL LRG LVL3 (GOWN DISPOSABLE) ×2 IMPLANT
GOWN STRL REUS W/ TWL XL LVL3 (GOWN DISPOSABLE) ×2 IMPLANT
GOWN STRL REUS W/TWL LRG LVL3 (GOWN DISPOSABLE) ×2
GOWN STRL REUS W/TWL XL LVL3 (GOWN DISPOSABLE) ×2
HOLDER FOLEY CATH W/STRAP (MISCELLANEOUS) ×4 IMPLANT
IMPL SPACEOAR SYSTEM 10ML (MISCELLANEOUS) ×2 IMPLANT
IMPLANT SPACEOAR SYSTEM 10ML (MISCELLANEOUS) ×4
IV NS 1000ML (IV SOLUTION) ×2
IV NS 1000ML BAXH (IV SOLUTION) ×2 IMPLANT
KIT RM TURNOVER CYSTO AR (KITS) ×4 IMPLANT
MANIFOLD NEPTUNE II (INSTRUMENTS) IMPLANT
PACK CYSTO (CUSTOM PROCEDURE TRAY) ×4 IMPLANT
SYRINGE 10CC LL (SYRINGE) ×4 IMPLANT
TUBE CONNECTING 12'X1/4 (SUCTIONS)
TUBE CONNECTING 12X1/4 (SUCTIONS) IMPLANT
UNDERPAD 30X30 INCONTINENT (UNDERPADS AND DIAPERS) ×8 IMPLANT
WATER STERILE IRR 3000ML UROMA (IV SOLUTION) ×4 IMPLANT
WATER STERILE IRR 500ML POUR (IV SOLUTION) ×4 IMPLANT
selectSeed 1-125 ×4 IMPLANT

## 2016-09-30 NOTE — Anesthesia Procedure Notes (Signed)
Procedure Name: Intubation Date/Time: 09/30/2016 9:52 AM Performed by: Montez Hageman Pre-anesthesia Checklist: Patient identified, Emergency Drugs available, Suction available and Patient being monitored Patient Re-evaluated:Patient Re-evaluated prior to inductionOxygen Delivery Method: Circle system utilized Preoxygenation: Pre-oxygenation with 100% oxygen Intubation Type: IV induction Ventilation: Mask ventilation without difficulty Laryngoscope Size: Mac and 4 Grade View: Grade I Tube type: Oral Tube size: 8.0 mm Number of attempts: 1 Airway Equipment and Method: Stylet Placement Confirmation: ETT inserted through vocal cords under direct vision,  positive ETCO2 and breath sounds checked- equal and bilateral Secured at: 21 cm Tube secured with: Tape Dental Injury: Teeth and Oropharynx as per pre-operative assessment

## 2016-09-30 NOTE — Progress Notes (Signed)
  Radiation Oncology         [phone] ________________________________  Name: Lance Stafford MRN: [id number]  Date: 09/30/2016  DOB: 08/12/1945       Prostate Seed Implant  IO:EVOJJ, Georgeanna Lea, NP  No ref. provider found  DIAGNOSIS: 71 y.o. 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 Prostate cancer ([place]) C61 DG Chest 2 View    DG Chest 2 View    PROCEDURE: Insertion of radioactive I-125 seeds into the prostate gland.  RADIATION DOSE: 145 Gy, definitive therapy.  TECHNIQUE: [name] was brought to the operating room with the urologist. He was placed in the dorsolithotomy position. He was catheterized and a rectal tube was inserted. The perineum was shaved, prepped and draped. The ultrasound probe was then introduced into the rectum to see the prostate gland.  TREATMENT DEVICE: A needle grid was attached to the ultrasound probe stand and anchor needles were placed.  3D PLANNING: The prostate was imaged in 3D using a sagittal sweep of the prostate probe. These images were transferred to the planning computer. There, the prostate, urethra and rectum were defined on each axial reconstructed image. Then, the software created an optimized 3D plan and a few seed positions were adjusted. The quality of the plan was reviewed using [facility] information for the target and the following two organs at risk:  Urethra and Rectum.  Then the accepted plan was uploaded to the seed Selectron afterloading unit.  PROSTATE VOLUME STUDY:  Using transrectal ultrasound the volume of the prostate was verified to be 35 cc.  SPECIAL TREATMENT PROCEDURE/SUPERVISION AND HANDLING: The Nucletron FIRST system was used to place the needles under sagittal guidance. A total of 21 needles were used to deposit 70 seeds in the prostate gland. The individual seed activity was 0.427 mCi.  SpaceOAR:  Yes  COMPLEX SIMULATION: At the end of the procedure, an anterior  radiograph of the pelvis was obtained to document seed positioning and count. Cystoscopy was performed to check the urethra and bladder.  MICRODOSIMETRY: At the end of the procedure, the patient was emitting 0.23 mR/hr at 1 meter. Accordingly, he was considered safe for hospital discharge.  PLAN: The patient will return to the radiation oncology clinic for post implant CT dosimetry in three weeks.   ________________________________  Sheral Apley Tammi Klippel, M.D.

## 2016-09-30 NOTE — Transfer of Care (Signed)
Last Vitals:  Vitals:   09/30/16 0743  BP: 135/71  Pulse: (!) 53  Resp: 16  Temp: 36.5 C    Last Pain:  Vitals:   09/30/16 0743  TempSrc: Oral      Patients Stated Pain Goal: 7 (09/30/16 1610)  Immediate Anesthesia Transfer of Care Note  Patient: Lance Stafford  Procedure(s) Performed: Procedure(s) (LRB): RADIOACTIVE SEED IMPLANT/BRACHYTHERAPY IMPLANT, SPACE OAR (N/A) CYSTOSCOPY  Patient Location: PACU  Anesthesia Type: General  Level of Consciousness: awake, alert  and oriented  Airway & Oxygen Therapy: Patient Spontanous Breathing and Patient connected to nasal cannula oxygen  Post-op Assessment: Report given to PACU RN and Post -op Vital signs reviewed and stable  Post vital signs: Reviewed and stable  Complications: No apparent anesthesia complications

## 2016-09-30 NOTE — Anesthesia Postprocedure Evaluation (Signed)
Anesthesia Post Note  Patient: Lance Stafford  Procedure(s) Performed: Procedure(s) (LRB): RADIOACTIVE SEED IMPLANT/BRACHYTHERAPY IMPLANT, SPACE OAR (N/A) CYSTOSCOPY     Patient location during evaluation: PACU Anesthesia Type: General Level of consciousness: sedated Pain management: pain level controlled Vital Signs Assessment: post-procedure vital signs reviewed and stable Respiratory status: spontaneous breathing and respiratory function stable Cardiovascular status: stable Anesthetic complications: no    Last Vitals:  Vitals:   09/30/16 1215 09/30/16 1230  BP: 134/79 135/65  Pulse: 63 63  Resp: 13 14  Temp:      Last Pain:  Vitals:   09/30/16 0743  TempSrc: Oral                 Quenesha Douglass DANIEL

## 2016-09-30 NOTE — Discharge Instructions (Addendum)
°  Post Anesthesia Home Care Instructions  Activity: Get plenty of rest for the remainder of the day. A responsible individual must stay with you for 24 hours following the procedure.  For the next 24 hours, DO NOT: -Drive a car -Paediatric nurse -Drink alcoholic beverages -Take any medication unless instructed by your physician -Make any legal decisions or sign important papers.  Meals: Start with liquid foods such as gelatin or soup. Progress to regular foods as tolerated. Avoid greasy, spicy, heavy foods. If nausea and/or vomiting occur, drink only clear liquids until the nausea and/or vomiting subsides. Call your physician if vomiting continues.  Special Instructions/Symptoms: Your throat may feel dry or sore from the anesthesia or the breathing tube placed in your throat during surgery. If this causes discomfort, gargle with warm salt water. The discomfort should disappear within 24 hours.  If you had a scopolamine patch placed behind your ear for the management of post- operative nausea and/or vomiting:  1. The medication in the patch is effective for 72 hours, after which it should be removed.  Wrap patch in a tissue and discard in the trash. Wash hands thoroughly with soap and water. 2. You may remove the patch earlier than 72 hours if you experience unpleasant side effects which may include dry mouth, dizziness or visual disturbances. 3. Avoid touching the patch. Wash your hands with soap and water after contact with the patch.       Radioactive Seed Implant Home Care Instructions   Activity:    Rest for the remainder of the day.  Do not drive or operate equipment today.  You may resume normal  activities in a few days as instructed by your physician, without risk of harmful radiation exposure to those around you, provided you follow the time and distance precautions on the Radiation Oncology Instruction Sheet.   Meals: Drink plenty of lipuids and eat light foods, such as  gelatin or soup this evening .  You may return to normal meal plan tomorrow.  Return To Work: You may return to work as instructed by Naval architect.  Special Instruction:   If any seeds are found, use tweezers to pick up seeds and place in a glass container of any kind and bring to your physician's office.  Call your physician if any of these symptoms occur:   Persistent or heavy bleeding  Urine stream diminishes or stops completely after catheter is removed  Fever equal to or greater than 101 degrees F  Cloudy urine with a strong foul odor  Severe pain  You may feel some burning pain and/or hesitancy when you urinate after the catheter is removed.  These symptoms may increase over the next few weeks, but should diminish within forur to six weeks.  Applying moist heat to the lower abdomen or a hot tub bath may help relieve the pain.  If the discomfort becomes severe, please call your physician for additional medications.

## 2016-09-30 NOTE — Op Note (Signed)
PATIENT:  Lance Stafford  PRE-OPERATIVE DIAGNOSIS:  Adenocarcinoma of the prostate  POST-OPERATIVE DIAGNOSIS:  Same  PROCEDURE:  Procedure(s): 1. I-125 radioactive seed implantation 2. Cystoscopy  SURGEON:  Surgeon(s): Irine Seal MD  Radiation oncologist: Dr. Tyler Pita  ANESTHESIA:  General  EBL:  Minimal  DRAINS: 21 French Foley catheter  INDICATION: Lance Stafford is a 71 y.o. with Stage T1c, Gleason 6 prostate cancer who has elected brachytherapy for treatment.  Description of procedure: After informed consent the patient was brought to the major OR, placed on the table and administered general anesthesia. He was then moved to the modified lithotomy position with his perineum perpendicular to the floor. His perineum and genitalia were then sterilely prepped. An official timeout was then performed. A 16 French Foley catheter was then placed in the bladder and filled with dilute contrast, a rectal tube was placed in the rectum and the transrectal ultrasound probe was placed in the rectum and affixed to the stand. He was then sterilely draped.  The sterile grid was installed.   Anchor needles were then placed.   Real time ultrasonography was used along with the seed planning software spot-pro version 3.1-00. This was used to develop the seed plan including the number of needles as well as number of seeds required for complete and adequate coverage. Real-time ultrasonography was then used along with the previously developed plan and the Nucletron device to implant a total of 70 seeds using 21 needles for a target dose of 145 Gy. This proceeded without difficulty or complication.  After completion of the seed implant, the SpaceOAR hydrogel was prepared and injected under US guidance in to fat plane posterior to the prostate after confirming good needle placement with saline injection and aspiration.  An excellent implant pattern was noted.  A Foley catheter was then removed as well as  the transrectal ultrasound probe and rectal probe. Flexible cystoscopy was then performed using the 17 French flexible scope which revealed a normal urethra throughout its length down to the sphincter which appeared intact. The prostatic urethra was short without obstruction. The bladder was then entered and fully and systematically inspected.  The ureteral orifices were noted to be of normal configuration and position.  There was mild trabeculation. The mucosa revealed no evidence of tumors. There were also no stones identified within the bladder.  No seeds or spacers were seen and/or removed from the bladder.  The cystoscope was then removed.  The drapes were removed.  The perineum was cleaned and dressed.  He was taken out of the lithotomy position and was awakened and taken to recovery room in stable and satisfactory condition. He tolerated procedure well and there were no intraoperative complications.

## 2016-09-30 NOTE — Interval H&P Note (Signed)
History and Physical Interval Note:  09/30/2016 8:44 AM  Lance Stafford  has presented today for surgery, with the diagnosis of PROSTATE CANCER  The various methods of treatment have been discussed with the patient and family. After consideration of risks, benefits and other options for treatment, the patient has consented to  Procedure(s): RADIOACTIVE SEED IMPLANT/BRACHYTHERAPY IMPLANT, SPACE OAR (N/A) as a surgical intervention .  The patient's history has been reviewed, patient examined, no change in status, stable for surgery.  I have reviewed the patient's chart and labs.  Questions were answered to the patient's satisfaction.     Handy Mcloud J

## 2016-09-30 NOTE — Anesthesia Preprocedure Evaluation (Signed)
Anesthesia Evaluation  Patient identified by MRN, date of birth, ID band Patient awake    Reviewed: Allergy & Precautions, NPO status , Patient's Chart, lab work & pertinent test results  Airway Mallampati: II  TM Distance: >3 FB Neck ROM: Full    Dental no notable dental hx. (+) Partial Upper, Partial Lower   Pulmonary former smoker,    Pulmonary exam normal breath sounds clear to auscultation       Cardiovascular negative cardio ROS Normal cardiovascular exam Rhythm:Regular Rate:Normal     Neuro/Psych negative neurological ROS  negative psych ROS   GI/Hepatic Neg liver ROS, GERD  Controlled,  Endo/Other  negative endocrine ROS  Renal/GU negative Renal ROS  negative genitourinary   Musculoskeletal negative musculoskeletal ROS (+)   Abdominal   Peds negative pediatric ROS (+)  Hematology negative hematology ROS (+)   Anesthesia Other Findings   Reproductive/Obstetrics negative OB ROS                             Anesthesia Physical Anesthesia Plan  ASA: II  Anesthesia Plan: General   Post-op Pain Management:    Induction: Intravenous  PONV Risk Score and Plan: 1 and Ondansetron  Airway Management Planned: LMA and Oral ETT  Additional Equipment:   Intra-op Plan:   Post-operative Plan: Extubation in OR  Informed Consent: I have reviewed the patients History and Physical, chart, labs and discussed the procedure including the risks, benefits and alternatives for the proposed anesthesia with the patient or authorized representative who has indicated his/her understanding and acceptance.   Dental advisory given  Plan Discussed with: CRNA  Anesthesia Plan Comments:         Anesthesia Quick Evaluation

## 2016-10-01 ENCOUNTER — Encounter (HOSPITAL_BASED_OUTPATIENT_CLINIC_OR_DEPARTMENT_OTHER): Payer: Self-pay | Admitting: Urology

## 2016-10-20 ENCOUNTER — Telehealth: Payer: Self-pay | Admitting: *Deleted

## 2016-10-20 NOTE — Telephone Encounter (Signed)
Called patient to remind of post seed appts. for 10-21-16, spoke with patient's wife and she is aware of these appts.

## 2016-10-21 ENCOUNTER — Ambulatory Visit
Admission: RE | Admit: 2016-10-21 | Discharge: 2016-10-21 | Disposition: A | Discharge status: Home / Self Care | Payer: Medicare Other | Source: Ambulatory Visit | Attending: Radiation Oncology | Admitting: Radiation Oncology

## 2016-10-21 ENCOUNTER — Ambulatory Visit (HOSPITAL_COMMUNITY)
Admission: RE | Admit: 2016-10-21 | Discharge: 2016-10-21 | Disposition: A | Discharge status: Home / Self Care | Payer: Medicare Other | Source: Ambulatory Visit | Attending: Urology | Admitting: Urology

## 2016-10-21 ENCOUNTER — Ambulatory Visit (HOSPITAL_COMMUNITY): Payer: Medicare Other

## 2016-10-21 VITALS — BP 134/77 | HR 72 | Temp 97.9°F | Resp 16 | Wt 226.8 lb

## 2016-10-21 DIAGNOSIS — C61 Malignant neoplasm of prostate: Secondary | ICD-10-CM

## 2016-10-21 NOTE — Progress Notes (Signed)
  Radiation Oncology         [phone] ________________________________  Name: Lance Stafford MRN: [id number]  Date: 10/21/2016  DOB: 06-09-1945  COMPLEX SIMULATION NOTE  NARRATIVE:  The patient was brought to the [facility] today following prostate seed implantation approximately one month ago.  Identity was confirmed.  All relevant records and images related to the planned course of therapy were reviewed.  Then, the patient was set-up supine.  CT images were obtained.  The CT images were loaded into the planning software.  Then the prostate and rectum were contoured.  Treatment planning then occurred.  The implanted iodine 125 seeds were identified by the physics staff for projection of radiation distribution  I have requested : 3D Simulation  I have requested a DVH of the following structures: Prostate and rectum.    ________________________________  Sheral Apley Tammi Klippel, M.D.   This document serves as a record of services personally performed by Tyler Pita, MD and Freeman Caldron PA-C. It was created on their behalf by Delton Coombes, a trained medical scribe. The creation of this record is based on the scribe's personal observations and the provider's statements to them. This document has been checked and approved by the attending provider.

## 2016-10-21 NOTE — Progress Notes (Signed)
Weight and vitals stable. Denies pain. Post seed IPSS 12. PMDC patient no pre seed IPSS found. Seen by urology today and prescribed Flomax. Scheduled to follow up with urology in October. Reports mild dysuria. Denies hematuria, leakage or incontinence.   BP 134/77   Pulse 72   Temp 97.9 F (36.6 C) (Oral)   Resp 16   Wt 226 lb 12.8 oz (102.9 kg)   SpO2 97%   BMI 29.12 kg/m  Wt Readings from Last 3 Encounters:  10/21/16 226 lb 12.8 oz (102.9 kg)  09/30/16 220 lb (99.8 kg)  06/18/16 228 lb 3.2 oz (103.5 kg)

## 2016-10-21 NOTE — Progress Notes (Signed)
Radiation Oncology         [phone] ________________________________  Name: Lance Stafford MRN: [id number]  Date: 10/21/2016  DOB: [birth year]  Follow-Up Visit Note  CC: [name], NP  Raynelle Bring, MD  Diagnosis:   71 y.o.  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    ICD-10-CM   1. Prostate cancer ([place]) C61     Interval Since Last Radiation:  3 weeks  Narrative:  The patient returns today for routine follow-up.  He is complaining of increased urinary frequency and urinary hesitation symptoms. He filled out a questionnaire regarding urinary function today providing and overall IPSS score of 12 characterizing his symptoms as moderate.  He denies any bowel symptoms.  ALLERGIES:  has No Known Allergies.  Meds: Current Outpatient Prescriptions  Medication Sig Dispense Refill  . aspirin EC 81 MG tablet Take 81 mg by mouth daily.    Marland Kitchen atorvastatin (LIPITOR) 40 MG tablet Take 40 mg by mouth daily.    . ciprofloxacin (CIPRO) 500 MG tablet Take 1 tablet (500 mg total) by mouth 2 (two) times daily. 6 tablet 0  . HYDROcodone-acetaminophen (NORCO) 5-325 MG tablet Take 1 tablet by mouth every 6 (six) hours as needed for moderate pain. 6 tablet 0  . nabumetone (RELAFEN) 500 MG tablet Take 500 mg by mouth 2 (two) times daily.    . ranitidine (ZANTAC) 150 MG tablet Take 150 mg by mouth as needed for heartburn.     No current facility-administered medications for this encounter.     Physical Findings: The patient is in no acute distress. Patient is alert and oriented.  vitals were not taken for this visit..  No significant changes.  Lab Findings: Lab Results  Component Value Date   WBC 5.4 09/23/2016   HGB 16.4 09/23/2016   HCT 47.4 09/23/2016   MCV 95.2 09/23/2016   PLT 200 09/23/2016    Radiographic Findings:  Patient underwent CT imaging in our clinic for post implant dosimetry. The CT appears to demonstrate an adequate  distribution of radioactive seeds throughout the prostate gland. There no seeds in her near the rectum. I suspect the final radiation plan and dosimetry will show appropriate coverage of the prostate gland.   Impression: The patient is recovering from the effects of radiation. His urinary symptoms should gradually improve over the next 4-6 months. We talked about this today. He is encouraged by his improvement already and is otherwise please with his outcome.   Plan: Today, I spent time talking to the patient about his prostate seed implant and resolving urinary symptoms. We also talked about long-term follow-up for prostate cancer following seed implant. He understands that ongoing PSA determinations and digital rectal exams will help perform surveillance to rule out disease recurrence. He understands what to expect with his PSA measures. Patient was also educated today about some of the long-term effects from radiation including a small risk for rectal bleeding and possibly erectile dysfunction. We talked about some of the general management approaches to these potential complications. However, I did encourage the patient to contact our office or return at any point if he has questions or concerns related to his previous radiation and prostate cancer.  _____________________________________  Sheral Apley. Tammi Klippel, M.D.     This document serves as a record of services personally performed by Tyler Pita MD. It was created on his behalf by Delton Coombes, a trained medical scribe. The creation of this record is  based on the scribe's personal observations and the provider's statements to them. This document has been checked and approved by the attending provider.

## 2016-10-27 ENCOUNTER — Encounter: Payer: Self-pay | Admitting: Radiation Oncology

## 2016-10-27 DIAGNOSIS — C61 Malignant neoplasm of prostate: Secondary | ICD-10-CM | POA: Diagnosis not present

## 2016-10-28 NOTE — Progress Notes (Signed)
  Radiation Oncology         [phone] ________________________________  Name: Lance Stafford MRN: [id number]  Date: 10/27/2016  DOB: December 28, 1945  3D Planning Note   Prostate Brachytherapy Post-Implant Dosimetry  Diagnosis: 71 y.o.  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  Narrative: On a previous date, [name] returned following prostate seed implantation for post implant planning. He underwent CT scan complex simulation to delineate the three-dimensional structures of the pelvis and demonstrate the radiation distribution.  Since that time, the seed localization, and complex isodose planning with dose volume histograms have now been completed.  Results:   Prostate Coverage - The dose of radiation delivered to the 90% or more of the prostate gland (D90) was 109.52% of the prescription dose. This exceeds our goal of greater than 90%. Rectal Sparing - The volume of rectal tissue receiving the prescription dose or higher was 0.0 cc. This falls under our thresholds tolerance of 1.0 cc.  Impression: The prostate seed implant appears to show adequate target coverage and appropriate rectal sparing.  Plan:  The patient will continue to follow with urology for ongoing PSA determinations. I would anticipate a high likelihood for local tumor control with minimal risk for rectal morbidity.  ________________________________  Sheral Apley Tammi Klippel, M.D.

## 2017-07-22 ENCOUNTER — Ambulatory Visit (INDEPENDENT_AMBULATORY_CARE_PROVIDER_SITE_OTHER): Payer: Medicare Other | Admitting: Urology

## 2017-07-22 DIAGNOSIS — R3912 Poor urinary stream: Secondary | ICD-10-CM

## 2017-07-22 DIAGNOSIS — N401 Enlarged prostate with lower urinary tract symptoms: Secondary | ICD-10-CM | POA: Diagnosis not present

## 2017-07-22 DIAGNOSIS — N5201 Erectile dysfunction due to arterial insufficiency: Secondary | ICD-10-CM | POA: Diagnosis not present

## 2017-07-22 DIAGNOSIS — Z8546 Personal history of malignant neoplasm of prostate: Secondary | ICD-10-CM | POA: Diagnosis not present

## 2018-01-20 ENCOUNTER — Ambulatory Visit (INDEPENDENT_AMBULATORY_CARE_PROVIDER_SITE_OTHER): Payer: Medicare Other | Admitting: Urology

## 2018-01-20 DIAGNOSIS — N5201 Erectile dysfunction due to arterial insufficiency: Secondary | ICD-10-CM | POA: Diagnosis not present

## 2018-01-20 DIAGNOSIS — Z8546 Personal history of malignant neoplasm of prostate: Secondary | ICD-10-CM | POA: Diagnosis not present

## 2018-01-20 DIAGNOSIS — N401 Enlarged prostate with lower urinary tract symptoms: Secondary | ICD-10-CM | POA: Diagnosis not present

## 2018-01-20 DIAGNOSIS — R3912 Poor urinary stream: Secondary | ICD-10-CM

## 2018-10-16 DIAGNOSIS — R1310 Dysphagia, unspecified: Secondary | ICD-10-CM | POA: Insufficient documentation

## 2018-10-20 ENCOUNTER — Other Ambulatory Visit: Payer: Self-pay

## 2018-10-20 ENCOUNTER — Ambulatory Visit (INDEPENDENT_AMBULATORY_CARE_PROVIDER_SITE_OTHER): Payer: Medicare Other | Admitting: Urology

## 2018-10-20 DIAGNOSIS — N5201 Erectile dysfunction due to arterial insufficiency: Secondary | ICD-10-CM | POA: Diagnosis not present

## 2018-10-20 DIAGNOSIS — N401 Enlarged prostate with lower urinary tract symptoms: Secondary | ICD-10-CM

## 2018-10-20 DIAGNOSIS — R351 Nocturia: Secondary | ICD-10-CM | POA: Diagnosis not present

## 2018-10-20 DIAGNOSIS — C61 Malignant neoplasm of prostate: Secondary | ICD-10-CM | POA: Diagnosis not present

## 2019-02-23 ENCOUNTER — Other Ambulatory Visit: Payer: Self-pay

## 2019-02-23 NOTE — Telephone Encounter (Signed)
Opened in error

## 2019-04-03 ENCOUNTER — Encounter: Payer: Self-pay | Admitting: Urology

## 2019-05-04 ENCOUNTER — Telehealth: Payer: Self-pay | Admitting: Urology

## 2019-05-04 NOTE — Telephone Encounter (Signed)
Pt called and stated he got a letter in the mail to schedule a 1 yr fu, however in his last office note in [place], it says a 6 mo fu from his last appt in July. When do you think we should schedule him?

## 2019-05-04 NOTE — Telephone Encounter (Signed)
37months from July. Next available appt

## 2019-06-29 ENCOUNTER — Other Ambulatory Visit: Payer: Self-pay | Admitting: Urology

## 2019-06-29 ENCOUNTER — Other Ambulatory Visit: Payer: Self-pay

## 2019-06-29 DIAGNOSIS — C61 Malignant neoplasm of prostate: Secondary | ICD-10-CM

## 2019-06-29 LAB — PSA: PSA: <0.1 ng/mL (ref <=4.0)

## 2019-07-06 ENCOUNTER — Ambulatory Visit (INDEPENDENT_AMBULATORY_CARE_PROVIDER_SITE_OTHER): Payer: Medicare Other | Admitting: Urology

## 2019-07-06 ENCOUNTER — Other Ambulatory Visit: Payer: Self-pay

## 2019-07-06 VITALS — BP 132/78 | HR 58 | Temp 98.1°F | Ht 73.0 in | Wt 227.0 lb

## 2019-07-06 DIAGNOSIS — N320 Bladder-neck obstruction: Secondary | ICD-10-CM

## 2019-07-06 DIAGNOSIS — R3915 Urgency of urination: Secondary | ICD-10-CM

## 2019-07-06 DIAGNOSIS — Z8546 Personal history of malignant neoplasm of prostate: Secondary | ICD-10-CM | POA: Diagnosis not present

## 2019-07-06 LAB — POCT URINALYSIS DIPSTICK
Blood, UA: NEGATIVE
Glucose, UA: NEGATIVE
Ketones, UA: NEGATIVE
Leukocytes, UA: NEGATIVE
Nitrite, UA: NEGATIVE
Protein, UA: NEGATIVE
Spec Grav, UA: <=1.005 — AB (ref 1.010–1.025)
Urobilinogen, UA: NEGATIVE E.U./dL — AB
pH, UA: 5 (ref 5.0–8.0)

## 2019-07-06 NOTE — Progress Notes (Signed)
Subjective:  1. History of prostate cancer   2. Urgency of urination   3. Bladder outlet obstruction      Lance Stafford returns today in f/u for his history of prostate cancer treated with seeds on 09/30/16. His PSA remains low at <0.1 and it was 13.6 preoperatively. He has had LUTS with an IPSS of 4 which is stable.  He remains on tamsulosin.   He has had no hematuria.   He has no other associated signs or symptoms.     ROS:  ROS:  A complete review of systems was performed.  All systems are negative except for pertinent findings as noted.   Review of Systems  Gastrointestinal: Positive for abdominal pain (He has occasional suprapubic pain with walking. ).  All other systems reviewed and are negative.   No Known Allergies  Outpatient Encounter Medications as of 07/06/2019  Medication Sig  . aspirin EC 81 MG tablet Take 81 mg by mouth daily.  Marland Kitchen atorvastatin (LIPITOR) 40 MG tablet Take 40 mg by mouth daily.  . nabumetone (RELAFEN) 500 MG tablet Take 500 mg by mouth 2 (two) times daily.  Marland Kitchen omeprazole (PRILOSEC) 40 MG capsule Take 40 mg by mouth daily.  . tamsulosin (FLOMAX) 0.4 MG CAPS capsule Take 0.4 mg by mouth.  Marland Kitchen HYDROcodone-acetaminophen (NORCO) 5-325 MG tablet Take 1 tablet by mouth every 6 (six) hours as needed for moderate pain. (Patient not taking: Reported on 07/06/2019)  . ranitidine (ZANTAC) 150 MG tablet Take 150 mg by mouth as needed for heartburn.   No facility-administered encounter medications on file as of 07/06/2019.    Past Medical History:  Diagnosis Date  . Arthritis   . Chronic low back pain    truck driver  . GERD (gastroesophageal reflux disease)   . Hyperlipidemia   . Nocturia   . Prostate cancer ([place]) UROLOGIST-  DR Rushton Early/  ONCOLOGIST-  DR MANNING   dx 02/ 2017via [facility]---  Stage T1c,  Gleason 3+3,  PSA 11.9  . Wears glasses   . Wears partial dentures    upper and lower    Past Surgical History:  Procedure Laterality Date  . CATARACT  EXTRACTION W/ INTRAOCULAR LENS  IMPLANT, BILATERAL  2015  . CYSTOSCOPY  09/30/2016   Procedure: CYSTOSCOPY;  Surgeon: Irine Seal, MD;  Location: [facility];  Service: Urology;;  no seeds found in bladder  . RADIOACTIVE SEED IMPLANT N/A 09/30/2016   Procedure: RADIOACTIVE SEED IMPLANT/BRACHYTHERAPY IMPLANT, SPACE OAR;  Surgeon: Irine Seal, MD;  Location: [facility];  Service: Urology;  Laterality: N/A;  70 seeds implanted  . SPERMATOCELECTOMY Left 01/30/2015   Procedure: SPERMATOCELECTOMY;  Surgeon: Irine Seal, MD;  Location: [facility];  Service: Urology;  Laterality: Left;    Social History   Socioeconomic History  . Marital status: Married    Spouse name: Not on file  . Number of children: Not on file  . Years of education: Not on file  . Highest education level: Not on file  Occupational History  . Not on file  Tobacco Use  . Smoking status: Former Smoker    Packs/day: 2.00    Years: 33.00    Pack years: 66.00    Types: Cigarettes    Quit date: 01/23/1991    Years since quitting: 28.4  . Smokeless tobacco: Never Used  Substance and Sexual Activity  . Alcohol use: No  . Drug use: No  . Sexual activity: Yes  Other  Topics Concern  . Not on file  Social History Narrative  . Not on file   Social Determinants of Health   Financial Resource Strain:   . Difficulty of Paying Living Expenses:   Food Insecurity:   . Worried About Charity fundraiser in the Last Year:   . Arboriculturist in the Last Year:   Transportation Needs:   . Film/video editor (Medical):   Marland Kitchen Lack of Transportation (Non-Medical):   Physical Activity:   . Days of Exercise per Week:   . Minutes of Exercise per Session:   Stress:   . Feeling of Stress :   Social Connections:   . Frequency of Communication with Friends and Family:   . Frequency of Social Gatherings with Friends and Family:   . Attends Religious Services:   . Active Member of Clubs  or Organizations:   . Attends Archivist Meetings:   Marland Kitchen Marital Status:   Intimate Partner Violence:   . Fear of Current or Ex-Partner:   . Emotionally Abused:   Marland Kitchen Physically Abused:   . Sexually Abused:     Family History  Problem Relation Age of Onset  . Cancer Brother        prostate       Objective: BP 132/78   Pulse (!) 58   Temp 98.1 F (36.7 C)   Ht 6\' 1"  (1.854 m)   Wt 227 lb (103 kg)   BMI 29.95 kg/m     Physical Exam  Lab Results:  Results for orders placed or performed in visit on 07/06/19 (from the past 24 hour(s))  POCT urinalysis dipstick     Status: Abnormal   Collection Time: 07/06/19  8:48 AM  Result Value Ref Range   Color, UA yellow    Clarity, UA clear    Glucose, UA Negative Negative   Bilirubin, UA small    Ketones, UA neg    Spec Grav, UA <=1.005 (A) 1.010 - 1.025   Blood, UA neg    pH, UA 5.0 5.0 - 8.0   Protein, UA Negative Negative   Urobilinogen, UA negative (A) 0.2 or 1.0 E.U./dL   Nitrite, UA neg    Leukocytes, UA Negative Negative   Appearance clear    Odor      BMET No results for input(s): NA, K, CL, CO2, GLUCOSE, BUN, CREATININE, CALCIUM in the last 72 hours. PSA PSA  Date Value Ref Range Status  06/29/2019 <0.1 < OR = 4.0 ng/mL Final    Comment:    The total PSA value from this assay system is  standardized against the WHO standard. The test  result will be approximately 20% lower when compared  to the equimolar-standardized total PSA (Beckman  Coulter). Comparison of serial PSA results should be  interpreted with this fact in mind. . This test was performed using the Siemens  chemiluminescent method. Values obtained from  different assay methods cannot be used interchangeably. PSA levels, regardless of value, should not be interpreted as absolute evidence of the presence or absence of disease.    No results found for: TESTOSTERONE    Studies/Results: No results found.    Assessment &  Plan: History of prostate cancer.   PSA remains undetectible.  Repeat in 6 months.  BOO with urgency.   He remains on tamsulosin but I have recommended he try to wean from the med.     No orders of the defined types were  placed in this encounter.    Orders Placed This Encounter  Procedures  . POCT urinalysis dipstick      Return in about 6 months (around 01/06/2020) for with PSA.   CC: [name], NP      Irine Seal 07/06/2019

## 2019-08-27 NOTE — Telephone Encounter (Signed)
This encounter was created in error - please disregard.

## 2019-10-23 DIAGNOSIS — Z2820 Immunization not carried out because of patient decision for unspecified reason: Secondary | ICD-10-CM | POA: Insufficient documentation

## 2019-10-23 DIAGNOSIS — F17200 Nicotine dependence, unspecified, uncomplicated: Secondary | ICD-10-CM | POA: Insufficient documentation

## 2019-10-23 DIAGNOSIS — R079 Chest pain, unspecified: Secondary | ICD-10-CM | POA: Insufficient documentation

## 2019-10-23 DIAGNOSIS — R109 Unspecified abdominal pain: Secondary | ICD-10-CM | POA: Insufficient documentation

## 2019-10-25 DIAGNOSIS — R935 Abnormal findings on diagnostic imaging of other abdominal regions, including retroperitoneum: Secondary | ICD-10-CM | POA: Insufficient documentation

## 2019-11-26 ENCOUNTER — Other Ambulatory Visit: Payer: Self-pay

## 2019-11-26 ENCOUNTER — Ambulatory Visit (INDEPENDENT_AMBULATORY_CARE_PROVIDER_SITE_OTHER): Payer: Medicare Other | Admitting: Gastroenterology

## 2019-11-26 ENCOUNTER — Other Ambulatory Visit (INDEPENDENT_AMBULATORY_CARE_PROVIDER_SITE_OTHER): Payer: Self-pay | Admitting: *Deleted

## 2019-11-26 ENCOUNTER — Encounter (INDEPENDENT_AMBULATORY_CARE_PROVIDER_SITE_OTHER): Payer: Self-pay | Admitting: Gastroenterology

## 2019-11-26 DIAGNOSIS — R160 Hepatomegaly, not elsewhere classified: Secondary | ICD-10-CM | POA: Insufficient documentation

## 2019-11-26 DIAGNOSIS — D3501 Benign neoplasm of right adrenal gland: Secondary | ICD-10-CM | POA: Diagnosis not present

## 2019-11-26 DIAGNOSIS — D3500 Benign neoplasm of unspecified adrenal gland: Secondary | ICD-10-CM | POA: Insufficient documentation

## 2019-11-26 DIAGNOSIS — Z01812 Encounter for preprocedural laboratory examination: Secondary | ICD-10-CM

## 2019-11-26 DIAGNOSIS — K769 Liver disease, unspecified: Secondary | ICD-10-CM

## 2019-11-26 DIAGNOSIS — K219 Gastro-esophageal reflux disease without esophagitis: Secondary | ICD-10-CM | POA: Diagnosis not present

## 2019-11-26 NOTE — Patient Instructions (Signed)
Schedule CT liver Please schedule appointment with endocrinology for management of adrenal adenoma Please follow up with cardiology for evaluation of chest pain Continue omeprazole 40 mg qday Explained presumed etiology of reflux symptoms. Instruction provided in the use of antireflux medication - patient should take medication in the morning 30-45 minutes before eating breakfast. Discussed avoidance of eating within 2 hours of lying down to sleep and benefit of blocks to elevate head of bed.

## 2019-11-26 NOTE — Progress Notes (Signed)
Lance Stafford, M.D. Gastroenterology & Hepatology [facility] [address] [place], [place] [address] Primary Care Physician: Lance Crigler, NP [address] Lance Lance Stafford [place] [address]  Referring MD: PCP  I will communicate my assessment and recommendations to the referring MD via EMR. Note: Occasional unusual wording and randomly placed punctuation marks may result from the use of speech recognition technology to transcribe this document"  Chief Complaint: Liver masses and adrenal adenoma  History of Present Illness: Lance Stafford is a 74 y.o. male with past medical history of prostate cancer status post RTX seeds, hyperlipidemia, GERD, who presents for evaluation of recently found liver masses and an adrenal adenoma.  The patient reports that he has had a longstanding history for the last 2 to 3 years of intermittent episodes of right upper quadrant and left upper quadrant pain which happen at least once a month.  He reported that the pain is usually elicited when he moves his body to the sides as he is constantly working and keeping active.  He reports that the pain usually resolves on its own and he does not take any type of medication for it.  However around 4 weeks ago he had an episode of severe right upper quadrant pain that lasted close to 2 days, the pain radiated to his right shoulder and was described as a constant pressure.  He reported that occasionally pain radiated to his anterior chest.  He has also noticed that in the past he has had episodes of retrosternal pain described as heartburn but occasionally as pressure, usually elicited when he eats spicy food.  The patient was very concerned about the severity of his pain and decided to go to his PCP.  He reports that he underwent an ultrasound and an CT of the abdomen, no reports are available but the patient was found to have "a lesion".  Due to this, he underwent an MRI of the  abdomen with and without IV contrast, which was performed on 11/14/2019 which states: Liver has 2 liver lesions with largest size of 1.9 cm.  The largest lesion is located in the medial and inferior right lobe of the liver.  The lesions are T1 hypointense and T2 hyperintense demonstrating rim enhancement on the postcontrast portal venous and delayed images without presence of central enhancement on delayed images. Normal liver contour otherwise. There is presence of a right adrenal gland mass of 2.6 cm without postcontrast enhancement consistent with an adrenal adenoma.  He also reports that he has a longstanding history of GERD for many years, for which he is taking omeprazole 40 mg every day with partial improvement of his symptoms as he reports that maybe once a month he has breakthrough episodes of heartburn.  He reports being compliant with intake of the omeprazole but he usually takes it at dinnertime.  The patient denies having any odynophagia, dysphagia, nausea, vomiting, fever, chills, hematochezia, melena, hematemesis, abdominal distention, diarrhea, jaundice, pruritus or weight loss.  The patient brings report of MRI of the abdomen with and without IV contrast performed   I personally reviewed and interpreted the available lab results from 2018 which showed CMP with ALT and AST less than 30 normal, albumin 4.1, total bilirubin 1.2, alkaline phosphatase 70, normal electrolytes and renal function.  Normal CBC.  INR 0.9 normal.  Last EGD: Never Last Colonoscopy: 2 years ago, normal per the patient report  FHx: neg for any gastrointestinal/liver disease, sister skin cancer x2, mother  throat cancer Social: former smoking but quit 1992, used to drink alcohol heavily for 2 years but quit 5176, neg illicit drug use Surgical: cholecystectomy  Past Medical History: Past Medical History:  Diagnosis Date  . Arthritis   . Chronic low back pain    truck driver  . GERD (gastroesophageal reflux  disease)   . Hyperlipidemia   . Nocturia   . Prostate cancer ([place]) Lance Stafford-  Lance Stafford/  Lance Stafford-  Lance Stafford   dx 02/ 2017via TRUSPbx---  Stage T1c,  Gleason 3+3,  PSA 11.9  . Wears glasses   . Wears partial dentures    upper and lower    Past Surgical History: Past Surgical History:  Procedure Laterality Date  . CATARACT EXTRACTION W/ INTRAOCULAR LENS  IMPLANT, BILATERAL  2015  . CYSTOSCOPY  09/30/2016   Procedure: CYSTOSCOPY;  Surgeon: Lance Seal, MD;  Location: [facility];  Service: Urology;;  no seeds found in bladder  . RADIOACTIVE SEED IMPLANT N/A 09/30/2016   Procedure: RADIOACTIVE SEED IMPLANT/BRACHYTHERAPY IMPLANT, SPACE OAR;  Surgeon: Lance Seal, MD;  Location: [facility];  Service: Urology;  Laterality: N/A;  70 seeds implanted  . SPERMATOCELECTOMY Left 01/30/2015   Procedure: SPERMATOCELECTOMY;  Surgeon: Lance Seal, MD;  Location: [facility];  Service: Urology;  Laterality: Left;    Family History: Family History  Problem Relation Age of Onset  . Cancer Lance Stafford        prostate    Social History: Social History   Tobacco Use  Smoking Status Former Smoker  . Packs/day: 2.00  . Years: 33.00  . Pack years: 66.00  . Types: Cigarettes  . Quit date: 01/23/1991  . Years since quitting: 28.8  Smokeless Tobacco Never Used   Social History   Substance and Sexual Activity  Alcohol Use No   Social History   Substance and Sexual Activity  Drug Use No    Allergies: No Known Allergies  Medications: Current Outpatient Medications  Medication Sig Dispense Refill  . aspirin EC 81 MG tablet Take 81 mg by mouth daily.    Marland Kitchen atorvastatin (LIPITOR) 40 MG tablet Take 40 mg by mouth daily.    . nabumetone (RELAFEN) 500 MG tablet Take 500 mg by mouth 2 (two) times daily.    Marland Kitchen omeprazole (PRILOSEC) 40 MG capsule Take 40 mg by mouth daily.     No current facility-administered medications for this visit.    Review  of Systems: GENERAL: negative for malaise, night sweats HEENT: No changes in hearing or vision, no nose bleeds or other nasal problems. NECK: Negative for lumps, goiter, pain and significant neck swelling RESPIRATORY: Negative for cough, wheezing CARDIOVASCULAR: Negative for chest pain, leg swelling, palpitations, orthopnea GI: SEE HPI MUSCULOSKELETAL: Negative for joint pain or swelling, back pain, and muscle pain. SKIN: Negative for lesions, rash PSYCH: Negative for sleep disturbance, mood disorder and recent psychosocial stressors. HEMATOLOGY Negative for prolonged bleeding, bruising easily, and swollen nodes. ENDOCRINE: Negative for cold or heat intolerance, polyuria, polydipsia and goiter. NEURO: negative for tremor, gait imbalance, syncope and seizures. The remainder of the review of systems is noncontributory.   Physical Exam: BP (!) 143/84 (BP Location: Right Arm, Patient Position: Sitting, Cuff Size: Normal)   Pulse 61   Temp 97.7 F (36.5 C) (Oral)   Ht 6\' 1"  (1.854 m)   Wt 222 lb (100.7 kg)   BMI 29.29 kg/m  GENERAL: The patient is AO x3, in no acute distress. Obese.  HEENT: Head is normocephalic and atraumatic. EOMI are intact. Mouth is well hydrated and without lesions. NECK: Supple. No masses LUNGS: Clear to auscultation. No presence of rhonchi/wheezing/rales. Adequate chest expansion HEART: RRR, normal s1 and s2. ABDOMEN: Soft, nontender, no guarding, no peritoneal signs, and nondistended. BS +. No palpable masses. EXTREMITIES: Without any cyanosis, clubbing, rash, lesions or edema. NEUROLOGIC: AOx3, no focal motor deficit. SKIN: no jaundice, no rashes  Imaging/Labs: as above  I personally reviewed and interpreted the available labs, imaging and endoscopic files.  Impression and Plan: Lance Stafford is a 74 y.o. male with past medical history of prostate cancer status post RTX seeds, hyperlipidemia, GERD, who presents for evaluation of recently found liver  masses and an adrenal adenoma.  Regarding his liver masses, the patient has 2 lesions which are less than 2 cm in size.  Based on that report of his imaging, the lesions did not present any complete washout of the contrast in the delayed phase, which makes a diagnosis of [place] less likely; furthermore, he does not have any risk factors for liver cancer given he has not have any cirrhotic features in his labs or imaging, and no family history of gastrointestinal cancer.  The lesion is not hypointense and T2 which makes cholangiocarcinoma less likely.  However, it would be important to further characterize the lesions with a CT liver triple phase.  On the other hand, the patient was found to have incidentally and adrenal adenoma, for which the patient will be referred to endocrinology for further evaluation.    The patient has also presented intermittent episodes of pain in his right upper quadrant and left upper quadrant which seem to be related to body movements.  This likely is related to musculoskeletal pain as his abdominal imaging has been negative so far.  Nevertheless, the patient has presented significant retrosternal pain for which he will need to be seen by his cardiologist to rule out any cardiac causes.  As he is presenting with chest pain especially when he eats food, it would be important for him to eventually undergo an endoscopic evaluation with an EGD once cardiac cause has been cleared as he has significant history of GERD.  The patient was counseled about the adequate way to take his PPI which may improve his heartburn episodes. Patient understood and agreed.  -Schedule CT liver -Please schedule appointment with endocrinology for management of adrenal adenoma Please follow up with cardiology for evaluation of chest pain -Continue omeprazole 40 mg qday -Explained presumed etiology of reflux symptoms. Instruction provided in the use of antireflux medication - patient should take medication in  the morning 30-45 minutes before eating breakfast. Discussed avoidance of eating within 2 hours of lying down to sleep and benefit of blocks to elevate head of bed. - RTC based on CT findings  All questions were answered.      Lance Peppers, MD Gastroenterology and Hepatology [facility]

## 2019-12-03 ENCOUNTER — Ambulatory Visit (HOSPITAL_COMMUNITY)
Admission: RE | Admit: 2019-12-03 | Discharge: 2019-12-03 | Disposition: A | Discharge status: Home / Self Care | Payer: Medicare Other | Source: Ambulatory Visit | Attending: Gastroenterology | Admitting: Gastroenterology

## 2019-12-03 ENCOUNTER — Telehealth (INDEPENDENT_AMBULATORY_CARE_PROVIDER_SITE_OTHER): Payer: Self-pay | Admitting: Gastroenterology

## 2019-12-03 ENCOUNTER — Other Ambulatory Visit: Payer: Self-pay

## 2019-12-03 DIAGNOSIS — R160 Hepatomegaly, not elsewhere classified: Secondary | ICD-10-CM

## 2019-12-03 DIAGNOSIS — K769 Liver disease, unspecified: Secondary | ICD-10-CM | POA: Insufficient documentation

## 2019-12-03 LAB — POCT I-STAT CREATININE: Creatinine, Ser: 0.9 mg/dL (ref 0.61–1.24)

## 2019-12-03 MED ORDER — IOHEXOL 300 MG/ML  SOLN
100.0000 mL | Freq: Once | INTRAMUSCULAR | Status: AC | PRN
  Administered 2019-12-03: 15:00:00 100 mL via INTRAVENOUS

## 2019-12-03 NOTE — Telephone Encounter (Signed)
I called the patient to inform him about the results of his recent abdominal imaging which showed two low-attenuation masses in the liver, one located in the posterior right lobe measuring 2.1 x 1.9 cm and a second one in the anterior dome measuring 1.3 x 1.2 cm. No other alterations were found in the liver or in the hepatobiliary system. However, there was presence of heterogeneous enhancement of the right adrenal mass measuring 3.3 x 1.8 cm and a left adrenal mass measuring 1.3 cm. There was presence of an ill-defined mass in the upper lobe of the left kidney measuring 2.3 x 2.1 cm. 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 Kidney. I informed these findings to the patient who understood. I advised him to make a follow-up appointment with his urologist Dr. Jeffie Pollock to evaluate the renal lesion, while I will order a liver biopsy. The patient understood and agreed.  Lance Peppers, MD Gastroenterology and Hepatology [facility]

## 2019-12-04 ENCOUNTER — Telehealth: Payer: Self-pay

## 2019-12-04 ENCOUNTER — Telehealth (INDEPENDENT_AMBULATORY_CARE_PROVIDER_SITE_OTHER): Payer: Self-pay | Admitting: Gastroenterology

## 2019-12-04 DIAGNOSIS — K769 Liver disease, unspecified: Secondary | ICD-10-CM

## 2019-12-04 NOTE — Addendum Note (Signed)
Addended by: Harvel Quale on: 12/04/2019 02:39 PM   Modules accepted: Orders

## 2019-12-04 NOTE — Telephone Encounter (Signed)
LFT msg to call office.

## 2019-12-04 NOTE — Telephone Encounter (Signed)
-----   Message from Irine Seal, MD sent at 12/03/2019  8:48 PM EDT ----- I replied to Dr. Jenetta Downer and will need to get the patient in but would like to wait until we have the results of his liver biopsy.  ----- Message ----- From: Valentina Lucks, LPN Sent: 6/68/1594   5:02 PM EDT To: Irine Seal, MD  This pt called saying he had talked with Dr. Jenetta Downer today. He asked pt to call us to make an appt to get you to look at his CT results in the kidney. DR. Michela Pitcher he was also going to send a message to office to ask you as well. I told pt I would send a task to ask you if you could look at results without an appt and advise [place] on what to do. We don't have any new appt to give the pt right now. Pls advise.

## 2019-12-04 NOTE — Telephone Encounter (Addendum)
I received a message from Dr. Berenice Primas regarding the need to perform an MRI liver with and without contrast before proceeding with liver biopsy to rule out an hemangioma.  I called the patient and informed about this.  We will order MRI liver with contrast ASAP and based on findings will refer for liver biopsy.  Lance Peppers, MD Gastroenterology and Hepatology [facility]

## 2019-12-04 NOTE — Telephone Encounter (Signed)
MRI sch'd 12/05/19 at 400 (330), npo 4 hrs, left detailed message for patient

## 2019-12-04 NOTE — Telephone Encounter (Signed)
Thanks Lelon Frohlich, that was fast!

## 2019-12-04 NOTE — Telephone Encounter (Signed)
Called Mr. Hauschild today. As discussed with his urologist, will wait until liver biopsy is performed to make follow up appointment with him to direct his care. Patient understood and agreed.  [name], MD Gastroenterology and Hepatology [facility]

## 2019-12-05 ENCOUNTER — Other Ambulatory Visit: Payer: Self-pay

## 2019-12-05 ENCOUNTER — Ambulatory Visit (HOSPITAL_COMMUNITY)
Admission: RE | Admit: 2019-12-05 | Discharge: 2019-12-05 | Disposition: A | Discharge status: Home / Self Care | Payer: Medicare Other | Source: Ambulatory Visit | Attending: Gastroenterology | Admitting: Gastroenterology

## 2019-12-05 DIAGNOSIS — K769 Liver disease, unspecified: Secondary | ICD-10-CM | POA: Insufficient documentation

## 2019-12-05 MED ORDER — GADOBUTROL 1 MMOL/ML IV SOLN
10.0000 mL | Freq: Once | INTRAVENOUS | Status: AC | PRN
  Administered 2019-12-05: 16:00:00 10 mL via INTRAVENOUS

## 2019-12-05 NOTE — Telephone Encounter (Signed)
-----   Message from Irine Seal, MD sent at 12/03/2019  8:48 PM EDT ----- I replied to Dr. Jenetta Downer and will need to get the patient in but would like to wait until we have the results of his liver biopsy.  ----- Message ----- From: Valentina Lucks, LPN Sent: 4/69/5072   5:02 PM EDT To: Irine Seal, MD  This pt called saying he had talked with Dr. Jenetta Downer today. He asked pt to call us to make an appt to get you to look at his CT results in the kidney. DR. Michela Pitcher he was also going to send a message to office to ask you as well. I told pt I would send a task to ask you if you could look at results without an appt and advise [place] on what to do. We don't have any new appt to give the pt right now. Pls advise.

## 2019-12-05 NOTE — Telephone Encounter (Signed)
Notified pt that we would make an appt. To see him after Dr. Jeffie Pollock saw liver biopsy result.

## 2019-12-06 ENCOUNTER — Telehealth (INDEPENDENT_AMBULATORY_CARE_PROVIDER_SITE_OTHER): Payer: Self-pay | Admitting: Gastroenterology

## 2019-12-06 ENCOUNTER — Encounter (HOSPITAL_COMMUNITY): Payer: Self-pay | Admitting: Radiology

## 2019-12-06 ENCOUNTER — Other Ambulatory Visit: Payer: Self-pay | Admitting: Radiology

## 2019-12-06 DIAGNOSIS — C801 Malignant (primary) neoplasm, unspecified: Secondary | ICD-10-CM

## 2019-12-06 DIAGNOSIS — C787 Secondary malignant neoplasm of liver and intrahepatic bile duct: Secondary | ICD-10-CM

## 2019-12-06 NOTE — Telephone Encounter (Signed)
Chest CT sch'd 12/07/19 at 400 (345), left detailed message for patient

## 2019-12-06 NOTE — Telephone Encounter (Signed)
I called the patient to inform him about the results of his recent MRI which showed presence of right adrenal gland enhancing lesion, left kidney enhancing lesion and 2 lesions in his liver, with subcentimeter lesions throughout his liver which are concerning for metastatic disease of unknown primary.  I explained to the patient that he will need to proceed with liver biopsy and CT of the chest will be ordered to evaluate for other sources of malignancy.  The patient understood and agreed.  Ann, I will place order for the CT chest today.    Thanks,  [name], MD Gastroenterology and Hepatology [facility]

## 2019-12-06 NOTE — Progress Notes (Signed)
Craige Cotta Male, 74 y.o., [birth year] MRN:  [id number] Phone:  [phone] (M) PCP:  [name], NP Primary Cvg:  Medicare/Medicare Part A And B Next Appt With Radiology (AP-CT 1) 12/07/2019 at 4:00 PM  FW: Biopsy Received: Today Graves, Andre Lefort D     Previous Messages   ----- Message -----  From: Arne Cleveland, MD  Sent: 12/06/2019  9:44 AM EDT  To: Lenore Cordia  Subject: RE: Biopsy                    Ok   [place] core bx liver lesion  R/o met    DDH    ----- Message -----  From: Lenore Cordia  Sent: 12/06/2019  8:59 AM EDT  To: Ir Procedure Requests  Subject: FW: Biopsy                    MRI has been finalized  ----- Message -----  From: Arne Cleveland, MD  Sent: 12/04/2019 12:42 PM EDT  To: Lenore Cordia  Subject: RE: Biopsy                    Needs MR liver with contrast 1st to better characterize lesions, r/o hemangiomas (biopsy danger!)   DDH    ----- Message -----  From: Lenore Cordia  Sent: 12/04/2019 11:54 AM EDT  To: Ir Procedure Requests  Subject: Biopsy                      Procedure Requested: US Biopsy (liver)    Reason for Procedure: Liver lesions, concern for metastases    Provider Requesting: Harvel Quale  Provider Telephone: [phone]    Other Info: Rad exams in Epic

## 2019-12-06 NOTE — Telephone Encounter (Signed)
Thanks Ann 

## 2019-12-07 ENCOUNTER — Ambulatory Visit (HOSPITAL_COMMUNITY)
Admission: RE | Admit: 2019-12-07 | Discharge: 2019-12-07 | Disposition: A | Discharge status: Home / Self Care | Payer: Medicare Other | Source: Ambulatory Visit | Attending: Gastroenterology | Admitting: Gastroenterology

## 2019-12-07 ENCOUNTER — Other Ambulatory Visit: Payer: Self-pay

## 2019-12-07 DIAGNOSIS — C787 Secondary malignant neoplasm of liver and intrahepatic bile duct: Secondary | ICD-10-CM

## 2019-12-07 DIAGNOSIS — C801 Malignant (primary) neoplasm, unspecified: Secondary | ICD-10-CM | POA: Diagnosis present

## 2019-12-10 ENCOUNTER — Telehealth (INDEPENDENT_AMBULATORY_CARE_PROVIDER_SITE_OTHER): Payer: Self-pay | Admitting: Gastroenterology

## 2019-12-10 DIAGNOSIS — C787 Secondary malignant neoplasm of liver and intrahepatic bile duct: Secondary | ICD-10-CM

## 2019-12-10 DIAGNOSIS — R911 Solitary pulmonary nodule: Secondary | ICD-10-CM

## 2019-12-10 NOTE — Telephone Encounter (Signed)
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  I had a very thorough discussion with him regarding these findings as well as the next step which includes his liver biopsy to confirm diagnosis on 12/13/2019 and a referral to be evaluated by Dr. Delton Coombes.  The patient understood and agreed.  I will call him back once I have the results of his liver biopsy.  [name], MD Gastroenterology and Hepatology [facility]

## 2019-12-12 ENCOUNTER — Other Ambulatory Visit: Payer: Self-pay | Admitting: Student

## 2019-12-13 ENCOUNTER — Ambulatory Visit (HOSPITAL_COMMUNITY)
Admission: RE | Admit: 2019-12-13 | Discharge: 2019-12-13 | Disposition: A | Discharge status: Home / Self Care | Payer: Medicare Other | Source: Ambulatory Visit | Attending: Gastroenterology | Admitting: Gastroenterology

## 2019-12-13 ENCOUNTER — Other Ambulatory Visit: Payer: Self-pay

## 2019-12-13 DIAGNOSIS — E785 Hyperlipidemia, unspecified: Secondary | ICD-10-CM | POA: Insufficient documentation

## 2019-12-13 DIAGNOSIS — C787 Secondary malignant neoplasm of liver and intrahepatic bile duct: Secondary | ICD-10-CM | POA: Insufficient documentation

## 2019-12-13 DIAGNOSIS — M549 Dorsalgia, unspecified: Secondary | ICD-10-CM | POA: Diagnosis not present

## 2019-12-13 DIAGNOSIS — M25511 Pain in right shoulder: Secondary | ICD-10-CM | POA: Diagnosis not present

## 2019-12-13 DIAGNOSIS — Z8546 Personal history of malignant neoplasm of prostate: Secondary | ICD-10-CM | POA: Insufficient documentation

## 2019-12-13 DIAGNOSIS — Z7982 Long term (current) use of aspirin: Secondary | ICD-10-CM | POA: Insufficient documentation

## 2019-12-13 DIAGNOSIS — Z79899 Other long term (current) drug therapy: Secondary | ICD-10-CM | POA: Diagnosis not present

## 2019-12-13 DIAGNOSIS — Z87891 Personal history of nicotine dependence: Secondary | ICD-10-CM | POA: Insufficient documentation

## 2019-12-13 DIAGNOSIS — K769 Liver disease, unspecified: Secondary | ICD-10-CM | POA: Diagnosis present

## 2019-12-13 DIAGNOSIS — K219 Gastro-esophageal reflux disease without esophagitis: Secondary | ICD-10-CM | POA: Diagnosis not present

## 2019-12-13 DIAGNOSIS — R160 Hepatomegaly, not elsewhere classified: Secondary | ICD-10-CM

## 2019-12-13 DIAGNOSIS — R911 Solitary pulmonary nodule: Secondary | ICD-10-CM | POA: Insufficient documentation

## 2019-12-13 LAB — CBC
HCT: 43.7 % (ref 39.0–52.0)
Hemoglobin: 14.9 g/dL (ref 13.0–17.0)
MCH: 31.8 pg (ref 26.0–34.0)
MCHC: 34.1 g/dL (ref 30.0–36.0)
MCV: 93.2 fL (ref 80.0–100.0)
Platelets: 224 10*3/uL (ref 150–400)
RBC: 4.69 MIL/uL (ref 4.22–5.81)
RDW: 11.9 % (ref 11.5–15.5)
WBC: 5.3 10*3/uL (ref 4.0–10.5)
nRBC: 0 % (ref 0.0–0.2)

## 2019-12-13 LAB — PROTIME-INR
INR: 1.1 (ref 0.8–1.2)
Prothrombin Time: 13.6 seconds (ref 11.4–15.2)

## 2019-12-13 MED ORDER — MIDAZOLAM HCL 2 MG/2ML IJ SOLN
INTRAMUSCULAR | Status: AC | PRN
  Administered 2019-12-13: 1 mg via INTRAVENOUS

## 2019-12-13 MED ORDER — LIDOCAINE-EPINEPHRINE 1 %-1:100000 IJ SOLN
INTRAMUSCULAR | Status: AC
  Administered 2019-12-13: 10 mL
  Filled 2019-12-13: qty 1

## 2019-12-13 MED ORDER — FENTANYL CITRATE (PF) 100 MCG/2ML IJ SOLN
INTRAMUSCULAR | Status: AC
  Filled 2019-12-13: qty 2

## 2019-12-13 MED ORDER — GELATIN ABSORBABLE 12-7 MM EX MISC
CUTANEOUS | Status: AC
  Filled 2019-12-13: qty 1

## 2019-12-13 MED ORDER — FENTANYL CITRATE (PF) 100 MCG/2ML IJ SOLN
INTRAMUSCULAR | Status: AC | PRN
  Administered 2019-12-13: 50 ug via INTRAVENOUS

## 2019-12-13 MED ORDER — SODIUM CHLORIDE 0.9 % IV SOLN: INTRAVENOUS | Status: DC

## 2019-12-13 MED ORDER — MIDAZOLAM HCL 2 MG/2ML IJ SOLN
INTRAMUSCULAR | Status: AC
  Filled 2019-12-13: qty 2

## 2019-12-13 MED ORDER — LIDOCAINE-EPINEPHRINE 1 %-1:100000 IJ SOLN
INTRAMUSCULAR | Status: AC
  Filled 2019-12-13: qty 1

## 2019-12-13 NOTE — Discharge Instructions (Signed)

## 2019-12-13 NOTE — H&P (Signed)
Chief Complaint: Patient was seen in consultation today for a liver lesion biopsy.  Referring Physician(s): Harvel Quale  Supervising Physician: Sandi Mariscal  Patient Status: Lance Stafford - Out-pt  History of Present Illness: Lance Stafford is a 74 y.o. male with a past medical history significant for arthritis, chronic back pain, GERD, HLD and prostate cancer s/p seed placement who presents today for an image guided liver lesion biopsy. Lance Stafford was seen by his PCP in late July of this year due to complaints of intermittent right upper and left upper quadrant pain which had acutely worsened. He reportedly had imaging done which was notable for a liver lesion. He underwent an MRI of the abdomen on 11/14/19 which noted 2 liver lesions - largest of which 1.9 cm, as well as a right adrenal gland mass of 2.6 cm. He was referred to GI and endocrinology for further evaluation. He underwent a CT liver on 12/03/19 which noted two small hypovascular liver masses concerning for hepatic metastases, bilateral adrenal masses concerning for metastatic disease and small low-attentuation masslike lesion in the upper pole of the left kidney. He then underwent a repeat MRI of the liver on 12/05/19 which noted a rim enhancing lesion of the right adrenal gland c/w metastatic disease, rim enhancing lesion of the superior pole of the left kidney and a rounded subcapsular lesion of the posteroinferior right lobe of the liver with additional subcentimeter lesions too small to characterize. As no primary lesion was identified a CT chest was obtained on 8/30 which noted a 1.9 cm spiculated pulmonary nodule in the right lung apex highly suspicious for primary bronchogenic neoplasm. IR has been asked to perform a liver lesion biopsy to further direct care.  Lance Stafford reports back and right shoulder pain which is chronic but worsened today as he has been laying in the hospital bed for several hours. He denies any other  complaints. He understands the procedure today and is agreeable to proceed as planned.  Past Medical History:  Diagnosis Date  . Arthritis   . Chronic low back pain    truck driver  . GERD (gastroesophageal reflux disease)   . Hyperlipidemia   . Nocturia   . Prostate cancer ([place]) UROLOGIST-  DR WRENN/  ONCOLOGIST-  DR MANNING   dx 02/ 2017via [facility]---  Stage T1c,  Gleason 3+3,  PSA 11.9  . Wears glasses   . Wears partial dentures    upper and lower    Past Surgical History:  Procedure Laterality Date  . CATARACT EXTRACTION W/ INTRAOCULAR LENS  IMPLANT, BILATERAL  2015  . CYSTOSCOPY  09/30/2016   Procedure: CYSTOSCOPY;  Surgeon: Irine Seal, MD;  Location: [facility];  Service: Urology;;  no seeds found in bladder  . RADIOACTIVE SEED IMPLANT N/A 09/30/2016   Procedure: RADIOACTIVE SEED IMPLANT/BRACHYTHERAPY IMPLANT, SPACE OAR;  Surgeon: Irine Seal, MD;  Location: [facility];  Service: Urology;  Laterality: N/A;  70 seeds implanted  . SPERMATOCELECTOMY Left 01/30/2015   Procedure: SPERMATOCELECTOMY;  Surgeon: Irine Seal, MD;  Location: [facility];  Service: Urology;  Laterality: Left;    Allergies: Patient has no known allergies.  Medications: Prior to Admission medications   Medication Sig Start Date End Date Taking? Authorizing Provider  aspirin EC 81 MG tablet Take 81 mg by mouth daily.   Yes [provider]  atorvastatin (LIPITOR) 40 MG tablet Take 40 mg by mouth daily.   Yes [provider]  nabumetone (RELAFEN)  500 MG tablet Take 500 mg by mouth 2 (two) times daily.   Yes [provider]  omeprazole (PRILOSEC) 40 MG capsule Take 40 mg by mouth daily.   Yes [provider]     Family History  Problem Relation Age of Onset  . Cancer Brother        prostate    Social History   Socioeconomic History  . Marital status: Married    Spouse name: Not on file  . Number of children: Not on  file  . Years of education: Not on file  . Highest education level: Not on file  Occupational History  . Not on file  Tobacco Use  . Smoking status: Former Smoker    Packs/day: 2.00    Years: 33.00    Pack years: 66.00    Types: Cigarettes    Quit date: 01/23/1991    Years since quitting: 28.9  . Smokeless tobacco: Never Used  Substance and Sexual Activity  . Alcohol use: No  . Drug use: No  . Sexual activity: Yes  Other Topics Concern  . Not on file  Social History Narrative  . Not on file   Social Determinants of Health   Financial Resource Strain:   . Difficulty of Paying Living Expenses: Not on file  Food Insecurity:   . Worried About Charity fundraiser in the Last Year: Not on file  . Ran Out of Food in the Last Year: Not on file  Transportation Needs:   . Lack of Transportation (Medical): Not on file  . Lack of Transportation (Non-Medical): Not on file  Physical Activity:   . Days of Exercise per Week: Not on file  . Minutes of Exercise per Session: Not on file  Stress:   . Feeling of Stress : Not on file  Social Connections:   . Frequency of Communication with Friends and Family: Not on file  . Frequency of Social Gatherings with Friends and Family: Not on file  . Attends Religious Stafford: Not on file  . Active Member of Clubs or Organizations: Not on file  . Attends Archivist Meetings: Not on file  . Marital Status: Not on file     Review of Systems: A 12 point ROS discussed and pertinent positives are indicated in the HPI above.  All other systems are negative.  Review of Systems  Constitutional: Negative for chills and fever.  Respiratory: Negative for cough and shortness of breath.   Cardiovascular: Positive for chest pain ("sometimes", improves with eating; none currently).  Gastrointestinal: Positive for abdominal pain (intermittent - none currently). Negative for nausea and vomiting.  Skin: Negative for color change.  Neurological:  Negative for dizziness and headaches.    Vital Signs: BP 127/80   Pulse 63   Temp 97.9 F (36.6 C) (Oral)   Resp 16   Ht 6\' 1"  (1.854 m)   Wt 225 lb (102.1 kg)   SpO2 98%   BMI 29.69 kg/m   Physical Exam Vitals reviewed.  Constitutional:      General: He is not in acute distress. HENT:     Head: Normocephalic.     Mouth/Throat:     Mouth: Mucous membranes are moist.     Pharynx: Oropharynx is clear. No oropharyngeal exudate or posterior oropharyngeal erythema.  Eyes:     General: No scleral icterus. Cardiovascular:     Rate and Rhythm: Normal rate and regular rhythm.  Pulmonary:     Effort:  Pulmonary effort is normal.     Breath sounds: Normal breath sounds.  Abdominal:     General: There is no distension.     Palpations: Abdomen is soft.     Tenderness: There is no abdominal tenderness.  Skin:    General: Skin is warm and dry.     Coloration: Skin is not jaundiced.  Neurological:     Mental Status: He is alert and oriented to person, place, and time.  Psychiatric:        Mood and Affect: Mood normal.        Behavior: Behavior normal.        Thought Content: Thought content normal.        Judgment: Judgment normal.      MD Evaluation Airway: WNL Heart: WNL Abdomen: WNL Chest/ Lungs: WNL ASA  Classification: 3 Mallampati/Airway Score: One   Imaging: CT CHEST WO CONTRAST  Result Date: 12/10/2019 CLINICAL DATA:  Hepatic metastases of unknown primary.  Staging. EXAM: CT CHEST WITHOUT CONTRAST TECHNIQUE: Multidetector CT imaging of the chest was performed following the standard protocol without IV contrast. COMPARISON:  Abdominal MRI 12/05/2019. Abdomen pelvis CT 12/03/2019. FINDINGS: Cardiovascular: The heart size is normal. No substantial pericardial effusion. Coronary artery calcification is evident. Atherosclerotic calcification is noted in the wall of the thoracic aorta. Mediastinum/Nodes: Mediastinal lymphadenopathy evident. Index 1.3 cm short axis high  right paratracheal node evident. 1.5 cm short axis low right paratracheal node associated. No evidence for gross hilar lymphadenopathy although assessment is limited by the lack of intravenous contrast on today's study. There is no axillary lymphadenopathy. The esophagus has normal imaging features. Lungs/Pleura: 1.9 cm spiculated pulmonary nodule is identified in the right lung apex (image 33/series 4). Subtle area of ground-glass nodularity identified inferior right upper lobe (image 76/4 and sagittal 75/6) likely reflecting infectious/inflammatory etiology. No suspicious pulmonary nodule or mass in the left lung. No focal airspace consolidation. No pleural effusion. Upper Abdomen: Better characterized on recent CT and MRI of the abdomen. Right adrenal nodule again noted. The upper pole left renal lesion seen on previous MRI is not well demonstrated on today's noncontrast CT. Musculoskeletal: No worrisome lytic or sclerotic osseous abnormality. IMPRESSION: 1. 1.9 cm spiculated pulmonary nodule in the right lung apex, highly suspicious for primary bronchogenic neoplasm. 2. Mediastinal lymphadenopathy, consistent with metastatic disease. 3. Subtle area of ground-glass nodularity inferior right upper lobe likely reflecting infectious/inflammatory etiology. Attention on follow-up recommended. 4. Right adrenal nodule, better characterized on recent CT and MRI of the abdomen. 5. Aortic Atherosclerosis (ICD10-I70.0). Electronically Signed   By: Misty Stanley M.D.   On: 12/10/2019 05:23   MR LIVER W WO CONTRAST  Result Date: 12/05/2019 CLINICAL DATA:  Characterize liver, adrenal, and renal lesions identified by prior CT. EXAM: MRI ABDOMEN WITHOUT AND WITH CONTRAST TECHNIQUE: Multiplanar multisequence MR imaging of the abdomen was performed both before and after the administration of intravenous contrast. CONTRAST:  38mL GADAVIST GADOBUTROL 1 MMOL/ML IV SOLN COMPARISON:  CT abdomen pelvis, 12/03/2019 FINDINGS: Lower  chest: No acute findings. Hepatobiliary: There is a rounded subcapsular lesion of the posteroinferior right lobe of the liver, hepatic segment VI, measuring 2.2 x 2.0 cm. This lesion demonstrates intrinsic intermediate T2 signal with continuous rim hyperenhancement but no internal enhancement (series 18, image 50). There is a smaller lesion with similar characteristics in the anterior liver dome, hepatic segment VII, measuring 1.1 x 0.9 cm (series 18, image 26). There are additional subcentimeter fluid signal lesions, too small to  characterize. Pancreas: No mass, inflammatory changes, or other parenchymal abnormality identified. Spleen:  Within normal limits in size and appearance. Adrenals/Urinary Tract: There is a rim enhancing lesion of the right adrenal gland measuring 3.0 x 2.5 cm (series 18, image 37). There is a an intrinsically T2 hypointense lesion of the superior pole of the left kidney which is somewhat ill-defined, again with rim enhancement measuring 1.9 x 1.7 cm (series 18, image 50). There are additional fluid signal simple cysts of the left kidney. No evidence of hydronephrosis. Stomach/Bowel: Visualized portions within the abdomen are unremarkable. Vascular/Lymphatic: No pathologically enlarged lymph nodes identified. Partially imaged infrarenal abdominal aortic aneurysm, better assessed on prior comparison CT. Other:  None. Musculoskeletal: No suspicious bone lesions identified. Incidental note of a fat containing right lumbar hernia (series 4, image 27). IMPRESSION: 1. There is a rim enhancing lesion of the right adrenal gland measuring 3.0 x 2.5 cm, consistent with metastatic disease. 2. There is rim enhancing lesion of the superior pole of the left kidney measuring 1.9 x 1.7 cm. 3. There is a rounded subcapsular lesion of the posteroinferior right lobe of the liver, hepatic segment VI, measuring 2.2 x 2.0 cm. This lesion demonstrates intrinsic intermediate T2 signal with continuous rim  hyperenhancement but no internal enhancement. There is a smaller lesion with similar characteristics in the anterior liver dome, hepatic segment VII, measuring 1.1 x 0.9 cm. Additional subcentimeter lesions too small to characterize. 4. Constellation of findings is most consistent with metastatic disease, however no primary lesion is identified in the abdomen. Consider additional primary search imaging of the chest as well as tissue sampling. 5. Partially imaged infrarenal abdominal aortic aneurysm, better assessed on prior CT. Aortic Atherosclerosis (ICD10-I70.0). These results will be called to the ordering clinician or representative by the Radiologist Assistant, and communication documented in the PACS or [employer]. Electronically Signed   By: Eddie Candle M.D.   On: 12/05/2019 16:36   CT LIVER ABD W/WO  Result Date: 12/03/2019 CLINICAL DATA:  Bilateral upper quadrant pain for 2-3 years. EXAM: CT ABDOMEN WITHOUT AND WITH CONTRAST TECHNIQUE: Multidetector CT imaging of the abdomen was performed following the standard protocol before and following the bolus administration of intravenous contrast. CONTRAST:  11mL OMNIPAQUE IOHEXOL 300 MG/ML  SOLN COMPARISON:  None. FINDINGS: Lower chest: No acute findings. Hepatobiliary: An ill-defined low-attenuation mass is seen in the posterior right lobe which measures 2.1 x 1.9 cm on image 38/11. Another low-attenuation mass is seen in the anterior liver dome which measures 1.3 x 1.2 cm on image 13/11. These have nonspecific characteristics. Prior cholecystectomy. No evidence of biliary obstruction. Pancreas:  No mass or inflammatory changes. Spleen:  Within normal limits in size and appearance. Adrenals/Urinary Tract: A 3.3 x 1.8 cm right adrenal mass is seen which shows heterogeneous enhancement and is not meet criteria for an adenoma. A smaller 1.3 cm left adrenal mass is seen. An ill-defined low-attenuation mass is seen in the upper pole of the left kidney  which measures 2.3 x 2.1 cm on image 41/11. A small left renal cyst is also seen. No evidence of nephrolithiasis or hydronephrosis. Stomach/Bowel: Visualized portion unremarkable. Vascular/Lymphatic: No pathologically enlarged lymph nodes identified. 3.0 cm infrarenal abdominal aortic aneurysm is seen. Other: A moderate lumbar hernia seen in the right posterior abdominal wall soft tissues which contains only fat. Musculoskeletal:  No suspicious bone lesions identified. IMPRESSION: Two small hypovascular liver masses which are nonspecific, but raise suspicion for hepatic metastases. Bilateral adrenal masses are  nonspecific, but also suspicious for metastatic disease. Small low-attenuation masslike lesion in upper pole of left kidney. Differential diagnosis includes renal cell carcinoma, metastatic disease, and renal abscess. Suggest correlation with urinalysis. 3.0 cm infrarenal abdominal aortic aneurysm. Recommend follow-up every 3 years. This recommendation follows ACR consensus guidelines: White Paper of the ACR Incidental Findings Committee II on Vascular Findings. J Am Coll Radiol 2013; 10:789-794. Moderate right-sided lumbar hernia containing only fat. Electronically Signed   By: Marlaine Hind M.D.   On: 12/03/2019 15:45    Labs:  CBC: No results for input(s): WBC, HGB, HCT, PLT in the last 8760 hours.  COAGS: No results for input(s): INR, APTT in the last 8760 hours.  BMP: Recent Labs    12/03/19 1400  CREATININE 0.90    LIVER FUNCTION TESTS: No results for input(s): BILITOT, AST, ALT, ALKPHOS, PROT, ALBUMIN in the last 8760 hours.  TUMOR MARKERS: No results for input(s): AFPTM, CEA, CA199, CHROMGRNA in the last 8760 hours.  Assessment and Plan:  74 y/o M with history of prostate cancer found to have new spiculated pulmonary nodule in the right lung apex concerning for primary bronchogenic neoplasm as well as mediastinal lymphadenopathy, bilateral adrenal and liver lesions concerning  for metastatic disease who presents today for a liver lesion biopsy.  Patient has been NPO since 6 pm last night, no current anticoagulation/antiplatelet medications. Afebrile, WBC 5.3, hgb 14.9, plt 224, INR 1.1.  Risks and benefits of liver lesion biopsy was discussed with the patient and/or patient's family including, but not limited to bleeding, infection, damage to adjacent structures or low yield requiring additional tests.  All of the questions were answered and there is agreement to proceed.  Consent signed and in chart.  Thank you for this interesting consult.  I greatly enjoyed meeting Marilyn Wing and look forward to participating in their care.  A copy of this report was sent to the requesting provider on this date.  Electronically Signed: Joaquim Nam, PA-C 12/13/2019, 10:19 AM   I spent a total of 30 Minutes  in face to face in clinical consultation, greater than 50% of which was counseling/coordinating care for liver lesion biopsy.

## 2019-12-13 NOTE — Procedures (Signed)
Pre Procedure Dx: Liver lesion  Post Procedural Dx: Same  Technically successful US guided biopsy of indeterminate lesion within the right lobe of the liver.   EBL: None  No immediate complications.   Jay Leliana Kontz, MD Pager #: [phone]    

## 2019-12-14 ENCOUNTER — Encounter (HOSPITAL_COMMUNITY): Payer: Self-pay

## 2019-12-14 NOTE — Progress Notes (Signed)
I have placed an introductory phone call to this patient. I introduced myself and explained my role in the patient's care. I briefly explained what the patient can expect during his initial visit with Dr. Delton Coombes. The patient was given the opportunity to ask questions and all were answered to his satisfaction. I provided my contact information and encouraged the patient to call with any questions or concerns. I will plan to meet with the patient during his initial visit with Dr. Delton Coombes.

## 2019-12-18 LAB — SURGICAL PATHOLOGY

## 2019-12-20 ENCOUNTER — Inpatient Hospital Stay (HOSPITAL_COMMUNITY): Payer: Medicare Other | Attending: Hematology | Admitting: Hematology

## 2019-12-20 ENCOUNTER — Encounter (HOSPITAL_COMMUNITY): Payer: Self-pay | Admitting: Hematology

## 2019-12-20 ENCOUNTER — Inpatient Hospital Stay (HOSPITAL_COMMUNITY): Payer: Medicare Other

## 2019-12-20 ENCOUNTER — Other Ambulatory Visit: Payer: Self-pay

## 2019-12-20 DIAGNOSIS — Z8042 Family history of malignant neoplasm of prostate: Secondary | ICD-10-CM | POA: Insufficient documentation

## 2019-12-20 DIAGNOSIS — Z808 Family history of malignant neoplasm of other organs or systems: Secondary | ICD-10-CM

## 2019-12-20 DIAGNOSIS — R634 Abnormal weight loss: Secondary | ICD-10-CM | POA: Insufficient documentation

## 2019-12-20 DIAGNOSIS — C3490 Malignant neoplasm of unspecified part of unspecified bronchus or lung: Secondary | ICD-10-CM | POA: Insufficient documentation

## 2019-12-20 DIAGNOSIS — C3491 Malignant neoplasm of unspecified part of right bronchus or lung: Secondary | ICD-10-CM

## 2019-12-20 DIAGNOSIS — R1011 Right upper quadrant pain: Secondary | ICD-10-CM | POA: Diagnosis not present

## 2019-12-20 DIAGNOSIS — C7971 Secondary malignant neoplasm of right adrenal gland: Secondary | ICD-10-CM | POA: Diagnosis not present

## 2019-12-20 DIAGNOSIS — N289 Disorder of kidney and ureter, unspecified: Secondary | ICD-10-CM | POA: Insufficient documentation

## 2019-12-20 DIAGNOSIS — C787 Secondary malignant neoplasm of liver and intrahepatic bile duct: Secondary | ICD-10-CM | POA: Diagnosis not present

## 2019-12-20 DIAGNOSIS — C61 Malignant neoplasm of prostate: Secondary | ICD-10-CM | POA: Diagnosis not present

## 2019-12-20 DIAGNOSIS — R519 Headache, unspecified: Secondary | ICD-10-CM | POA: Insufficient documentation

## 2019-12-20 DIAGNOSIS — Z87891 Personal history of nicotine dependence: Secondary | ICD-10-CM | POA: Diagnosis not present

## 2019-12-20 LAB — CBC WITH DIFFERENTIAL/PLATELET
Abs Immature Granulocytes: 0.03 10*3/uL (ref 0.00–0.07)
Basophils Absolute: 0 10*3/uL (ref 0.0–0.1)
Basophils Relative: 1 %
Eosinophils Absolute: 0.1 10*3/uL (ref 0.0–0.5)
Eosinophils Relative: 2 %
HCT: 47.3 % (ref 39.0–52.0)
Hemoglobin: 15.8 g/dL (ref 13.0–17.0)
Immature Granulocytes: 1 %
Lymphocytes Relative: 34 %
Lymphs Abs: 2.1 10*3/uL (ref 0.7–4.0)
MCH: 31.9 pg (ref 26.0–34.0)
MCHC: 33.4 g/dL (ref 30.0–36.0)
MCV: 95.6 fL (ref 80.0–100.0)
Monocytes Absolute: 0.8 10*3/uL (ref 0.1–1.0)
Monocytes Relative: 13 %
Neutro Abs: 3 10*3/uL (ref 1.7–7.7)
Neutrophils Relative %: 49 %
Platelets: 282 10*3/uL (ref 150–400)
RBC: 4.95 MIL/uL (ref 4.22–5.81)
RDW: 11.9 % (ref 11.5–15.5)
WBC: 6 10*3/uL (ref 4.0–10.5)
nRBC: 0 % (ref 0.0–0.2)

## 2019-12-20 LAB — COMPREHENSIVE METABOLIC PANEL
ALT: 18 U/L (ref 0–44)
AST: 18 U/L (ref 15–41)
Albumin: 3.9 g/dL (ref 3.5–5.0)
Alkaline Phosphatase: 107 U/L (ref 38–126)
Anion gap: 14 (ref 5–15)
BUN: 14 mg/dL (ref 8–23)
CO2: 26 mmol/L (ref 22–32)
Calcium: 9.1 mg/dL (ref 8.9–10.3)
Chloride: 97 mmol/L — ABNORMAL LOW (ref 98–111)
Creatinine, Ser: 1.07 mg/dL (ref 0.61–1.24)
GFR calc Af Amer: >60 mL/min (ref >60)
GFR calc non Af Amer: >60 mL/min (ref >60)
Glucose, Bld: 124 mg/dL — ABNORMAL HIGH (ref 70–99)
Potassium: 4 mmol/L (ref 3.5–5.1)
Sodium: 137 mmol/L (ref 135–145)
Total Bilirubin: 1 mg/dL (ref 0.3–1.2)
Total Protein: 7.8 g/dL (ref 6.5–8.1)

## 2019-12-20 NOTE — Patient Instructions (Signed)
[place] at [facility] Discharge Instructions  You were seen and examined today by Dr. Delton Coombes. Dr. Delton Coombes is a medical oncologist, meaning he specializes in the medical management of cancer. Dr. Delton Coombes discussed your past medical history, family history of cancer and current functional status.  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 In order to complete the work-up, Dr. Delton Coombes has ordered a brain MRI.  Dr. Delton Coombes will order additional testing on the tissue collected during your biopsy. This test will identify specific mutations of your cancer and help drive treatment options. Treatment options may include chemotherapy, immunotherapy or oral medication.  You will return to the clinic in about 2 weeks to discuss test results and treatment options.   Thank you for choosing [place] at [facility] to provide your oncology and hematology care.  To afford each patient quality time with our provider, please arrive at least 15 minutes before your scheduled appointment time.   If you have a lab appointment with the [facility] please come in thru the Main Entrance and check in at the main information desk.  You need to re-schedule your appointment should you arrive 10 or more minutes late.  We strive to give you quality time with our providers, and arriving late affects you and other patients whose appointments are after yours.  Also, if you no show three or more times for appointments you may be dismissed from the clinic at the providers discretion.     Again, thank you for choosing [facility].  Our hope is that these requests will decrease the amount of time that you wait before being seen by our physicians.        _____________________________________________________________  Should you have questions after your visit to [facility], please contact our office at [phone] and follow the prompts.  Our office hours are 8:00 a.m. and 4:30 p.m. Monday - Friday.  Please note that voicemails left after 4:00 p.m. may not be returned until the following business day.  We are closed weekends and major holidays.  You do have access to a nurse 24-7, just call the main number to the clinic [phone] and do not press any options, hold on the line and a nurse will answer the phone.    For prescription refill requests, have your pharmacy contact our office and allow 72 hours.    Due to Covid, you will need to wear a mask upon entering the hospital. If you do not have a mask, a mask will be given to you at the Main Entrance upon arrival. For doctor visits, patients may have 1 support person age 37 or older with them. For treatment visits, patients can not have anyone with them due to social distancing guidelines and our immunocompromised population.

## 2019-12-20 NOTE — Progress Notes (Signed)
[place] [address] [place] [address]   CLINIC:  Medical Oncology/Hematology  CONSULT NOTE  Patient Care Team: [name], NP as PCP - General (Nurse Practitioner) Dishmon, Garwin Brothers, RN as Oncology Nurse Navigator (Oncology)  CHIEF COMPLAINTS/PURPOSE OF CONSULTATION:  Evaluation of lung cancer  HISTORY OF PRESENTING ILLNESS:  [name] 74 y.o. male is here because of evaluation of lung cancer, at the request of Dr. Montez Morita. He went to Dr. Montez Morita after having intermittent bilateral upper quadrant abdominal pain for the last 2-3 years which happened at least monthly.  Today he is accompanied by his wife, Lance Stafford. He reports feeling well. He reports that his intermittent RUQ pain radiates into his right ribs and also has right shoulder pain which radiates down his right arm. He reports having lost weight from the 240s to 217 lbs due to eating less because of sensation of food getting stuck in his throat in his chest. He denies having vision changes but he gets intermittent headaches. He was diagnosed with prostate cancer in 2017 and treated with implanted seeds in 09/30/2016. He denies history of MI's or CVA's.  He has cut down on his yard work 3 weeks ago. He works as a Forensic scientist. He quit smoking in 1992 after smoking for 32 years, peaking at 2 PPD. His brother had prostate cancer; mother had throat cancer; sister had some kind of skin cancer.  MEDICAL HISTORY:  Past Medical History:  Diagnosis Date  . Arthritis   . Chronic low back pain    truck driver  . GERD (gastroesophageal reflux disease)   . Hyperlipidemia   . Nocturia   . Prostate cancer ([place]) UROLOGIST-  DR WRENN/  ONCOLOGIST-  DR MANNING   dx 02/ 2017via TRUSPbx---  Stage T1c,  Gleason 3+3,  PSA 11.9  . Wears glasses   . Wears partial dentures    upper and lower    SURGICAL HISTORY: Past Surgical History:  Procedure Laterality Date  .  CATARACT EXTRACTION W/ INTRAOCULAR LENS  IMPLANT, BILATERAL  2015  . CHOLECYSTECTOMY    . CYSTOSCOPY  09/30/2016   Procedure: CYSTOSCOPY;  Surgeon: Irine Seal, MD;  Location: [facility];  Service: Urology;;  no seeds found in bladder  . RADIOACTIVE SEED IMPLANT N/A 09/30/2016   Procedure: RADIOACTIVE SEED IMPLANT/BRACHYTHERAPY IMPLANT, SPACE OAR;  Surgeon: Irine Seal, MD;  Location: [facility];  Service: Urology;  Laterality: N/A;  70 seeds implanted  . SPERMATOCELECTOMY Left 01/30/2015   Procedure: SPERMATOCELECTOMY;  Surgeon: Irine Seal, MD;  Location: [facility];  Service: Urology;  Laterality: Left;    SOCIAL HISTORY: Social History   Socioeconomic History  . Marital status: Married    Spouse name: Not on file  . Number of children: 2  . Years of education: Not on file  . Highest education level: Not on file  Occupational History  . Occupation: truck Geophysicist/field seismologist  Tobacco Use  . Smoking status: Former Smoker    Packs/day: 2.00    Years: 33.00    Pack years: 66.00    Types: Cigarettes    Quit date: 01/23/1991    Years since quitting: 28.9  . Smokeless tobacco: Never Used  Substance and Sexual Activity  . Alcohol use: No  . Drug use: No  . Sexual activity: Yes  Other Topics Concern  . Not on file  Social History Narrative  . Not on file  Social Determinants of Health   Financial Resource Strain: Low Risk   . Difficulty of Paying Living Expenses: Not hard at all  Food Insecurity: No Food Insecurity  . Worried About Charity fundraiser in the Last Year: Never true  . Ran Out of Food in the Last Year: Never true  Transportation Needs: No Transportation Needs  . Lack of Transportation (Medical): No  . Lack of Transportation (Non-Medical): No  Physical Activity: Sufficiently Active  . Days of Exercise per Week: 1 day  . Minutes of Exercise per Session: 150+ min  Stress: No Stress Concern Present  . Feeling of Stress : Only a  little  Social Connections: Socially Integrated  . Frequency of Communication with Friends and Family: More than three times a week  . Frequency of Social Gatherings with Friends and Family: Three times a week  . Attends Religious Services: More than 4 times per year  . Active Member of Clubs or Organizations: Yes  . Attends Archivist Meetings: More than 4 times per year  . Marital Status: Married  Human resources officer Violence: Not At Risk  . Fear of Current or Ex-Partner: No  . Emotionally Abused: No  . Physically Abused: No  . Sexually Abused: No    FAMILY HISTORY: Family History  Problem Relation Age of Onset  . Diabetes Mother   . Stroke Mother   . Cancer Father        throat  . Cancer Sister        skin  . Cancer Brother        prostate  . Cancer Paternal Grandmother   . Clotting disorder Daughter     ALLERGIES:  has No Known Allergies.  MEDICATIONS:  Current Outpatient Medications  Medication Sig Dispense Refill  . atorvastatin (LIPITOR) 40 MG tablet Take 40 mg by mouth daily.    . nabumetone (RELAFEN) 500 MG tablet Take 500 mg by mouth 2 (two) times daily.    Marland Kitchen omeprazole (PRILOSEC) 40 MG capsule Take 40 mg by mouth daily.    Marland Kitchen aspirin EC 81 MG tablet Take 81 mg by mouth daily. (Patient not taking: Reported on 12/20/2019)     No current facility-administered medications for this visit.    REVIEW OF SYSTEMS:   Review of Systems  Constitutional: Positive for appetite change (moderately decreased), fatigue (moderate) and unexpected weight change (20 lbs weight loss).  HENT:   Positive for trouble swallowing (food gets stuck and won't go down).   Eyes: Negative for eye problems.  Gastrointestinal: Positive for abdominal pain (RUQ radiating to R ribs).  Musculoskeletal: Positive for arthralgias (R shoulder pain radiating down arm).  Neurological: Positive for headaches (intermittent).  Psychiatric/Behavioral: Positive for sleep disturbance.  All other  systems reviewed and are negative.    PHYSICAL EXAMINATION: ECOG PERFORMANCE STATUS: 1 - Symptomatic but completely ambulatory  Vitals:   12/20/19 0834  BP: 115/79  Pulse: 74  Resp: 18  Temp: 98.1 F (36.7 C)  SpO2: 95%   Filed Weights   12/20/19 0834  Weight: 217 lb (98.4 kg)   Physical Exam Vitals reviewed.  Constitutional:      Appearance: Normal appearance.  Cardiovascular:     Rate and Rhythm: Normal rate and regular rhythm.     Pulses: Normal pulses.     Heart sounds: Normal heart sounds.  Pulmonary:     Effort: Pulmonary effort is normal.     Breath sounds: Normal breath sounds.  Abdominal:  Palpations: Abdomen is soft. There is no hepatomegaly, splenomegaly or mass.     Tenderness: There is no abdominal tenderness.     Hernia: No hernia is present.  Lymphadenopathy:     Upper Body:     Right upper body: No supraclavicular or axillary adenopathy.     Left upper body: No supraclavicular or axillary adenopathy.     Lower Body: No right inguinal adenopathy. No left inguinal adenopathy.  Neurological:     General: No focal deficit present.     Mental Status: He is alert and oriented to person, place, and time.  Psychiatric:        Mood and Affect: Mood normal.        Behavior: Behavior normal.      LABORATORY DATA:  I have reviewed the data as listed CBC Latest Ref Rng & Units 12/13/2019 09/23/2016 01/30/2015  WBC 4.0 - 10.5 K/uL 5.3 5.4 -  Hemoglobin 13.0 - 17.0 g/dL 14.9 16.4 16.3  Hematocrit 39 - 52 % 43.7 47.4 -  Platelets 150 - 400 K/uL 224 200 -   CMP Latest Ref Rng & Units 12/03/2019 09/23/2016 07/03/2015  Glucose 65 - 99 mg/dL - 110(H) -  BUN 6 - 20 mg/dL - 13 -  Creatinine 0.61 - 1.24 mg/dL 0.90 1.03 0.90  Sodium 135 - 145 mmol/L - 140 -  Potassium 3.5 - 5.1 mmol/L - 5.0 -  Chloride 101 - 111 mmol/L - 103 -  CO2 22 - 32 mmol/L - 28 -  Calcium 8.9 - 10.3 mg/dL - 9.2 -  Total Protein 6.5 - 8.1 g/dL - 7.2 -  Total Bilirubin 0.3 - 1.2 mg/dL -  1.2 -  Alkaline Phos 38 - 126 U/L - 70 -  AST 15 - 41 U/L - 22 -  ALT 17 - 63 U/L - 16(L) -   Lab Results  Component Value Date   PSA <0.1 06/29/2019   Surgical pathology [phone]) on 12/13/2019: Right lobe liver needle core biopsy: metastatic lung adenocarcinoma.  RADIOGRAPHIC STUDIES: I have personally reviewed the radiological images as listed and agreed with the findings in the report. CT CHEST WO CONTRAST  Result Date: 12/10/2019 CLINICAL DATA:  Hepatic metastases of unknown primary.  Staging. EXAM: CT CHEST WITHOUT CONTRAST TECHNIQUE: Multidetector CT imaging of the chest was performed following the standard protocol without IV contrast. COMPARISON:  Abdominal MRI 12/05/2019. Abdomen pelvis CT 12/03/2019. FINDINGS: Cardiovascular: The heart size is normal. No substantial pericardial effusion. Coronary artery calcification is evident. Atherosclerotic calcification is noted in the wall of the thoracic aorta. Mediastinum/Nodes: Mediastinal lymphadenopathy evident. Index 1.3 cm short axis high right paratracheal node evident. 1.5 cm short axis low right paratracheal node associated. No evidence for gross hilar lymphadenopathy although assessment is limited by the lack of intravenous contrast on today's study. There is no axillary lymphadenopathy. The esophagus has normal imaging features. Lungs/Pleura: 1.9 cm spiculated pulmonary nodule is identified in the right lung apex (image 33/series 4). Subtle area of ground-glass nodularity identified inferior right upper lobe (image 76/4 and sagittal 75/6) likely reflecting infectious/inflammatory etiology. No suspicious pulmonary nodule or mass in the left lung. No focal airspace consolidation. No pleural effusion. Upper Abdomen: Better characterized on recent CT and MRI of the abdomen. Right adrenal nodule again noted. The upper pole left renal lesion seen on previous MRI is not well demonstrated on today's noncontrast CT. Musculoskeletal: No  worrisome lytic or sclerotic osseous abnormality. IMPRESSION: 1. 1.9 cm spiculated pulmonary nodule in  the right lung apex, highly suspicious for primary bronchogenic neoplasm. 2. Mediastinal lymphadenopathy, consistent with metastatic disease. 3. Subtle area of ground-glass nodularity inferior right upper lobe likely reflecting infectious/inflammatory etiology. Attention on follow-up recommended. 4. Right adrenal nodule, better characterized on recent CT and MRI of the abdomen. 5. Aortic Atherosclerosis (ICD10-I70.0). Electronically Signed   By: Misty Stanley M.D.   On: 12/10/2019 05:23   MR LIVER W WO CONTRAST  Result Date: 12/05/2019 CLINICAL DATA:  Characterize liver, adrenal, and renal lesions identified by prior CT. EXAM: MRI ABDOMEN WITHOUT AND WITH CONTRAST TECHNIQUE: Multiplanar multisequence MR imaging of the abdomen was performed both before and after the administration of intravenous contrast. CONTRAST:  45m GADAVIST GADOBUTROL 1 MMOL/ML IV SOLN COMPARISON:  CT abdomen pelvis, 12/03/2019 FINDINGS: Lower chest: No acute findings. Hepatobiliary: There is a rounded subcapsular lesion of the posteroinferior right lobe of the liver, hepatic segment VI, measuring 2.2 x 2.0 cm. This lesion demonstrates intrinsic intermediate T2 signal with continuous rim hyperenhancement but no internal enhancement (series 18, image 50). There is a smaller lesion with similar characteristics in the anterior liver dome, hepatic segment VII, measuring 1.1 x 0.9 cm (series 18, image 26). There are additional subcentimeter fluid signal lesions, too small to characterize. Pancreas: No mass, inflammatory changes, or other parenchymal abnormality identified. Spleen:  Within normal limits in size and appearance. Adrenals/Urinary Tract: There is a rim enhancing lesion of the right adrenal gland measuring 3.0 x 2.5 cm (series 18, image 37). There is a an intrinsically T2 hypointense lesion of the superior pole of the left kidney  which is somewhat ill-defined, again with rim enhancement measuring 1.9 x 1.7 cm (series 18, image 50). There are additional fluid signal simple cysts of the left kidney. No evidence of hydronephrosis. Stomach/Bowel: Visualized portions within the abdomen are unremarkable. Vascular/Lymphatic: No pathologically enlarged lymph nodes identified. Partially imaged infrarenal abdominal aortic aneurysm, better assessed on prior comparison CT. Other:  None. Musculoskeletal: No suspicious bone lesions identified. Incidental note of a fat containing right lumbar hernia (series 4, image 27). IMPRESSION: 1. There is a rim enhancing lesion of the right adrenal gland measuring 3.0 x 2.5 cm, consistent with metastatic disease. 2. There is rim enhancing lesion of the superior pole of the left kidney measuring 1.9 x 1.7 cm. 3. There is a rounded subcapsular lesion of the posteroinferior right lobe of the liver, hepatic segment VI, measuring 2.2 x 2.0 cm. This lesion demonstrates intrinsic intermediate T2 signal with continuous rim hyperenhancement but no internal enhancement. There is a smaller lesion with similar characteristics in the anterior liver dome, hepatic segment VII, measuring 1.1 x 0.9 cm. Additional subcentimeter lesions too small to characterize. 4. Constellation of findings is most consistent with metastatic disease, however no primary lesion is identified in the abdomen. Consider additional primary search imaging of the chest as well as tissue sampling. 5. Partially imaged infrarenal abdominal aortic aneurysm, better assessed on prior CT. Aortic Atherosclerosis (ICD10-I70.0). These results will be called to the ordering clinician or representative by the Radiologist Assistant, and communication documented in the PACS or [employer] Electronically Signed   By: AEddie CandleM.D.   On: 12/05/2019 16:36   UK[place]CORE BIOPSY (LIVER)  Result Date: 12/13/2019 INDICATION: Remote history of prostate cancer. Otherwise, no  known primary, now with multiple liver lesions worrisome for metastatic disease. Please from ultrasound-guided biopsy for tissue diagnostic purposes. EXAM: ULTRASOUND GUIDED LIVER LESION BIOPSY COMPARISON:  Abdominal MRI-12/05/2019; CT abdomen and pelvis-12/03/2019 MEDICATIONS:  None ANESTHESIA/SEDATION: Fentanyl 50 mcg IV; Versed 1 mg IV Total Moderate Sedation time:  12 Minutes. The patient's level of consciousness and vital signs were monitored continuously by radiology nursing throughout the procedure under my direct supervision. COMPLICATIONS: None immediate. PROCEDURE: Informed written consent was obtained from the patient after a discussion of the risks, benefits and alternatives to treatment. The patient understands and consents the procedure. A timeout was performed prior to the initiation of the procedure. Ultrasound scanning was performed of the right upper abdominal quadrant demonstrates an approximately 2.1 x 2.1 cm isoechoic lesion within the inferior medial subcapsular aspect the right lobe of the liver correlating with the ill-defined lesion seen on preceding abdominal CT image 38, series 11. Procedure was planned. The right upper abdominal quadrant was prepped and draped in the usual sterile fashion. The overlying soft tissues were anesthetized with 1% lidocaine with epinephrine. A 17 gauge, 6.8 cm co-axial needle was advanced into a peripheral aspect of the lesion. This was followed by 6 core biopsies with an 18 gauge core device under direct ultrasound guidance. The coaxial needle tract was embolized with a small amount of Gel-Foam slurry and superficial hemostasis was obtained with manual compression. Post procedural scanning was negative for definitive area of hemorrhage or additional complication. A dressing was placed. The patient tolerated the procedure well without immediate post procedural complication. IMPRESSION: Technically successful ultrasound guided core needle biopsy of indeterminate  lesion within the caudal aspect of the right lobe of the liver. Electronically Signed   By: Sandi Mariscal M.D.   On: 12/13/2019 16:29   CT LIVER ABD W/WO  Result Date: 12/03/2019 CLINICAL DATA:  Bilateral upper quadrant pain for 2-3 years. EXAM: CT ABDOMEN WITHOUT AND WITH CONTRAST TECHNIQUE: Multidetector CT imaging of the abdomen was performed following the standard protocol before and following the bolus administration of intravenous contrast. CONTRAST:  1102m OMNIPAQUE IOHEXOL 300 MG/ML  SOLN COMPARISON:  None. FINDINGS: Lower chest: No acute findings. Hepatobiliary: An ill-defined low-attenuation mass is seen in the posterior right lobe which measures 2.1 x 1.9 cm on image 38/11. Another low-attenuation mass is seen in the anterior liver dome which measures 1.3 x 1.2 cm on image 13/11. These have nonspecific characteristics. Prior cholecystectomy. No evidence of biliary obstruction. Pancreas:  No mass or inflammatory changes. Spleen:  Within normal limits in size and appearance. Adrenals/Urinary Tract: A 3.3 x 1.8 cm right adrenal mass is seen which shows heterogeneous enhancement and is not meet criteria for an adenoma. A smaller 1.3 cm left adrenal mass is seen. An ill-defined low-attenuation mass is seen in the upper pole of the left kidney which measures 2.3 x 2.1 cm on image 41/11. A small left renal cyst is also seen. No evidence of nephrolithiasis or hydronephrosis. Stomach/Bowel: Visualized portion unremarkable. Vascular/Lymphatic: No pathologically enlarged lymph nodes identified. 3.0 cm infrarenal abdominal aortic aneurysm is seen. Other: A moderate lumbar hernia seen in the right posterior abdominal wall soft tissues which contains only fat. Musculoskeletal:  No suspicious bone lesions identified. IMPRESSION: Two small hypovascular liver masses which are nonspecific, but raise suspicion for hepatic metastases. Bilateral adrenal masses are nonspecific, but also suspicious for metastatic disease.  Small low-attenuation masslike lesion in upper pole of left kidney. Differential diagnosis includes renal cell carcinoma, metastatic disease, and renal abscess. Suggest correlation with urinalysis. 3.0 cm infrarenal abdominal aortic aneurysm. Recommend follow-up every 3 years. This recommendation follows ACR consensus guidelines: White Paper of the ACR Incidental Findings Committee II on Vascular Findings.  J Am Coll Radiol 2013; 10:789-794. Moderate right-sided lumbar hernia containing only fat. Electronically Signed   By: Marlaine Hind M.D.   On: 12/03/2019 15:45    ASSESSMENT:  1. 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 Jenetta Downer. -CT abdomen on 12/03/2019 showed posterior right lobe lesion measuring 2.1 x 1.9 cm and another anterior liver dome lesion measuring 1.3 x 1.2 cm. Right adrenal mass measuring 3.3 x 1.8 cm. -MRI of the liver on 12/05/2019 showed rim-enhancing lesion of the right adrenal gland measuring 3.0 x 2.5 cm. There is rim-enhancing lesion of the superior pole of the left kidney measuring 1.9 x 1.7 cm. Right lobe of the liver lesion measuring 2.2 x 2.0 cm. Anterior liver dome lesion measuring 1.1 x 0.9 cm. -CT chest without contrast on 12/07/2019 showed 1.9 cm spiculated nodule in the right lung apex. Right paratracheal lymph node 1.3 cm. 1.5 cm lower right paratracheal node. -Right lobe of liver needle biopsy consistent with adenocarcinoma, positive for CK7, TTF-1. Negative for CK20, CDX2, Napsin a and CK 5/6.  2. Prostate cancer: -Diagnosed in 2017, seed implants done in June 2018.  3. Social/family history: -He drives a Teacher, English as a foreign language. Quit smoking in 1992, 2 packs/day for 32 years. -Brother had prostate cancer. Sister had skin cancer and mother had throat cancer.  PLAN:  1. 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 -I have discussed the diagnosis and prognosis in detail. -I have recommended MRI of the brain with and without contrast to complete staging work-up. -I have also recommended PD-L1 and foundation 1 testing. We will regroup again in 2 weeks to discuss results and further treatment plan. -I am not in a hurry to start treatment as he is not symptomatic. -If there are no targetable mutations, he will likely need port placement.   All questions were answered. The patient knows to call the clinic with any problems, questions or concerns.   [name], MD, 12/20/19 9:11 AM  [place] [phone]   I, Milinda Antis, am acting as a scribe for Dr. Sanda Linger.  I, [name] MD, have reviewed the above documentation for accuracy and completeness, and I agree with the above.

## 2019-12-21 ENCOUNTER — Ambulatory Visit: Payer: Medicare Other | Admitting: "Endocrinology

## 2019-12-25 ENCOUNTER — Encounter (HOSPITAL_COMMUNITY): Payer: Self-pay | Admitting: Hematology

## 2019-12-25 ENCOUNTER — Encounter (HOSPITAL_COMMUNITY): Payer: Self-pay

## 2019-12-28 ENCOUNTER — Ambulatory Visit (HOSPITAL_COMMUNITY)
Admission: RE | Admit: 2019-12-28 | Discharge: 2019-12-28 | Disposition: A | Discharge status: Home / Self Care | Payer: Medicare Other | Source: Ambulatory Visit | Attending: Hematology | Admitting: Hematology

## 2019-12-28 ENCOUNTER — Other Ambulatory Visit: Payer: Self-pay

## 2019-12-28 DIAGNOSIS — C3490 Malignant neoplasm of unspecified part of unspecified bronchus or lung: Secondary | ICD-10-CM | POA: Diagnosis present

## 2019-12-28 MED ORDER — GADOBUTROL 1 MMOL/ML IV SOLN
10.0000 mL | Freq: Once | INTRAVENOUS | Status: AC | PRN
  Administered 2019-12-28: 10 mL via INTRAVENOUS

## 2020-01-01 ENCOUNTER — Encounter (HOSPITAL_COMMUNITY): Payer: Self-pay | Admitting: Hematology

## 2020-01-01 ENCOUNTER — Encounter (HOSPITAL_COMMUNITY): Payer: Self-pay

## 2020-01-03 ENCOUNTER — Other Ambulatory Visit: Payer: Self-pay

## 2020-01-03 ENCOUNTER — Inpatient Hospital Stay (HOSPITAL_BASED_OUTPATIENT_CLINIC_OR_DEPARTMENT_OTHER): Payer: Medicare Other | Admitting: Hematology

## 2020-01-03 VITALS — BP 142/74 | HR 70 | Temp 97.1°F | Resp 18 | Wt 224.0 lb

## 2020-01-03 DIAGNOSIS — Z7189 Other specified counseling: Secondary | ICD-10-CM | POA: Diagnosis not present

## 2020-01-03 DIAGNOSIS — C3491 Malignant neoplasm of unspecified part of right bronchus or lung: Secondary | ICD-10-CM | POA: Diagnosis not present

## 2020-01-03 MED ORDER — CYANOCOBALAMIN 1000 MCG/ML IJ SOLN
INTRAMUSCULAR | Status: AC
  Filled 2020-01-03: qty 1

## 2020-01-03 MED ORDER — FOLIC ACID 1 MG PO TABS: 1.0000 mg | ORAL_TABLET | Freq: Every day | ORAL | 1 refills | Status: DC

## 2020-01-03 MED ORDER — CYANOCOBALAMIN 1000 MCG/ML IJ SOLN
1000.0000 ug | Freq: Once | INTRAMUSCULAR | Status: AC
  Administered 2020-01-03: 1000 ug via INTRAMUSCULAR

## 2020-01-03 NOTE — Progress Notes (Signed)
B-12 given per MD orders, see MAR for administration details.  Patient tolerated without incidence.  Discharged ambulatory in stable condition.

## 2020-01-03 NOTE — Progress Notes (Signed)
START ON PATHWAY REGIMEN - Non-Small Cell Lung     A cycle is every 21 days:     Pembrolizumab      Pemetrexed      Carboplatin   **Always confirm dose/schedule in your pharmacy ordering system**  Patient Characteristics: Stage IV Metastatic, Nonsquamous, Initial Chemotherapy/Immunotherapy, PS = 0, 1, ALK Rearrangement Negative and ROS1 Rearrangement Negative and NTRK Gene Fusion?Negative and RET Gene Fusion?Negative and EGFR Mutation Negative, PD-L1 Expression Positive  1-49% (TPS) / Negative / Not Tested / Awaiting Test Results and Immunotherapy Candidate Therapeutic Status: Stage IV Metastatic Histology: Nonsquamous Cell ROS1 Rearrangement Status: Negative Other Mutations/Biomarkers: No Other Actionable Mutations Chemotherapy/Immunotherapy LOT: Initial Chemotherapy/Immunotherapy Molecular Targeted Therapy: Not Appropriate KRAS G12C Mutation Status: Negative MET Exon 14 Mutation Status: Negative RET Gene Fusion Status: Negative EGFR Mutation Status: Negative/Wild Type NTRK Gene Fusion Status: Negative PD-L1 Expression Status: PD-L1 Positive 1-49% (TPS) ALK Rearrangement Status: Negative BRAF V600E Mutation Status: Negative ECOG Performance Status: 0 Biomarker Assessment Status Confirmation: All Genomic Markers Negative, or Only MET+ or BRAF+ or KRAS G12C+ Immunotherapy Candidate Status: Candidate for Immunotherapy Intent of Therapy: Non-Curative / Palliative Intent, Discussed with Patient

## 2020-01-03 NOTE — Patient Instructions (Signed)
[place] at [facility] Discharge Instructions  You were seen today by Dr. Delton Coombes. He went over your recent results and scans. You received a vitamin B12 injection today. You will be referred to a general surgeon for port placement. 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 It is best if you do not work while you receive the radiation treatment and at least for the 1 cycle of chemo. You will be prescribed folic acid to take 1 mg daily. Dr. Delton Coombes will see you back in 2 weeks for labs and follow up.   Thank you for choosing [place] at [facility] to provide your oncology and hematology care.  To afford each patient quality time with our provider, please arrive at least 15 minutes before your scheduled appointment time.   If you have a lab appointment with the [facility] please come in thru the Main Entrance and check in at the main information desk  You need to re-schedule your appointment should you arrive 10 or more minutes late.  We strive to give you quality time with our providers, and arriving late affects you and other patients whose appointments are after yours.  Also, if you no show three or more times for appointments you may be dismissed from the clinic at the providers discretion.     Again, thank you for choosing [facility].  Our hope is that these requests will decrease the amount of time that you wait before being seen by our physicians.       _____________________________________________________________  Should you have questions after your visit to [facility], please contact our office at [phone] between the hours of 8:00 a.m. and 4:30 p.m.  Voicemails left after 4:00 p.m. will not be returned until the following business day.  For prescription refill requests, have your pharmacy contact our office and allow 72 hours.    Cancer Center Support  Programs:   > Cancer Support Group  2nd Tuesday of the month 1pm-2pm, Journey Room

## 2020-01-03 NOTE — Progress Notes (Signed)
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  PRIOR THERAPY: None  NGS Results: PD-L1 TPS 1 %, Foundation 1 MS--stable, TMB 10 Muts/Mb  CURRENT THERAPY: Under work-up  BRIEF ONCOLOGIC HISTORY:  Oncology History  Adenocarcinoma of lung, stage 4, right ([facility])  12/20/2019 Initial Diagnosis   Adenocarcinoma of lung, stage 4, right ([facility])   12/20/2019 Cancer Staging   Staging form: Lung, AJCC 8th Edition - Clinical: Stage IVB (cT1b, cN2, pM1c) - Signed by [name], MD on 12/20/2019   12/25/2019 Genetic Testing   PDL1     12/31/2019 Genetic Testing   Foundation One       CANCER STAGING: Cancer Staging Adenocarcinoma of lung, stage 4, right ([place]) Staging form: Lung, AJCC 8th Edition - Clinical: Stage IVB (cT1b, cN2, pM1c) - Signed by [name], MD on 12/20/2019   INTERVAL HISTORY:  [name], a 74 y.o. male, returns for routine follow-up of his stage IV right lung adenocarcinoma. [place] was last seen on 12/20/2019.   Today he is accompanied by his wife. He reports feeling well. He reports having occasional headaches in the back of his head, but denies issues with balance or falls. He reports having pain in his right shoulder going down to the hand which can get very severe; the pain is worse when he sleeps on his right shoulder. He sometimes rubs Icy-Hot on the shoulder to help with the pain.  He is inquiring about the flu vaccine and COVID vaccine today.   REVIEW OF SYSTEMS:  Review of Systems  Constitutional: Positive for fatigue (mild). Negative for appetite change.  Cardiovascular: Positive for chest pain (occasional).  Musculoskeletal: Positive for arthralgias (R shoulder pain from 5 to 9/10). Negative for gait problem.    Neurological: Positive for headaches (occasional). Negative for dizziness and gait problem.  All other systems reviewed and are negative.   PAST MEDICAL/SURGICAL HISTORY:  Past Medical History:  Diagnosis Date  . Arthritis   . Chronic low back pain    truck driver  . GERD (gastroesophageal reflux disease)   . Hyperlipidemia   . Nocturia   . Prostate cancer ([facility]) UROLOGIST-  DR WRENN/  ONCOLOGIST-  DR MANNING   dx 02/ 2017via TRUSPbx---  Stage T1c,  Gleason 3+3,  PSA 11.9  . Wears glasses   . Wears partial dentures    upper and lower   Past Surgical History:  Procedure Laterality Date  . CATARACT EXTRACTION W/ INTRAOCULAR LENS  IMPLANT, BILATERAL  2015  . CHOLECYSTECTOMY    . CYSTOSCOPY  09/30/2016   Procedure: CYSTOSCOPY;  Surgeon: Irine Seal, MD;  Location: [facility];  Service: Urology;;  no seeds found in bladder  . RADIOACTIVE SEED IMPLANT N/A 09/30/2016   Procedure: RADIOACTIVE SEED IMPLANT/BRACHYTHERAPY IMPLANT, SPACE OAR;  Surgeon: Irine Seal, MD;  Location: [facility];  Service: Urology;  Laterality: N/A;  70 seeds implanted  . SPERMATOCELECTOMY Left 01/30/2015   Procedure: SPERMATOCELECTOMY;  Surgeon: Irine Seal, MD;  Location: [facility];  Service: Urology;  Laterality: Left;    SOCIAL HISTORY:  Social History   Socioeconomic History  . Marital status: Married    Spouse name: Not on file  . Number of children: 2  . Years of education: Not  on file  . Highest education level: Not on file  Occupational History  . Occupation: truck Geophysicist/field seismologist  Tobacco Use  . Smoking status: Former Smoker    Packs/day: 2.00    Years: 33.00    Pack years: 66.00    Types: Cigarettes    Quit date: 01/23/1991    Years since quitting: 28.9  . Smokeless tobacco: Never Used  Substance and Sexual Activity  . Alcohol use: No  . Drug use: No  . Sexual activity: Yes  Other Topics Concern  . Not on file  Social History Narrative  . Not  on file   Social Determinants of Health   Financial Resource Strain: Low Risk   . Difficulty of Paying Living Expenses: Not hard at all  Food Insecurity: No Food Insecurity  . Worried About Charity fundraiser in the Last Year: Never true  . Ran Out of Food in the Last Year: Never true  Transportation Needs: No Transportation Needs  . Lack of Transportation (Medical): No  . Lack of Transportation (Non-Medical): No  Physical Activity: Sufficiently Active  . Days of Exercise per Week: 1 day  . Minutes of Exercise per Session: 150+ min  Stress: No Stress Concern Present  . Feeling of Stress : Only a little  Social Connections: Socially Integrated  . Frequency of Communication with Friends and Family: More than three times a week  . Frequency of Social Gatherings with Friends and Family: Three times a week  . Attends Religious Services: More than 4 times per year  . Active Member of Clubs or Organizations: Yes  . Attends Archivist Meetings: More than 4 times per year  . Marital Status: Married  Human resources officer Violence: Not At Risk  . Fear of Current or Ex-Partner: No  . Emotionally Abused: No  . Physically Abused: No  . Sexually Abused: No    FAMILY HISTORY:  Family History  Problem Relation Age of Onset  . Diabetes Mother   . Stroke Mother   . Cancer Father        throat  . Cancer Sister        skin  . Cancer Brother        prostate  . Cancer Paternal Grandmother   . Clotting disorder Daughter     CURRENT MEDICATIONS:  Current Outpatient Medications  Medication Sig Dispense Refill  . aspirin EC 81 MG tablet Take 81 mg by mouth daily.     Marland Kitchen atorvastatin (LIPITOR) 40 MG tablet Take 40 mg by mouth daily.    Mariane Baumgarten Calcium (STOOL SOFTENER PO) Take 2 tablets by mouth daily.    . Ibuprofen (ADVIL PO) Take 1 tablet by mouth as needed.    . nabumetone (RELAFEN) 500 MG tablet Take 500 mg by mouth 2 (two) times daily.    Marland Kitchen omeprazole (PRILOSEC) 40 MG capsule  Take 40 mg by mouth daily.     No current facility-administered medications for this visit.    ALLERGIES:  No Known Allergies  PHYSICAL EXAM:  Performance status (ECOG): 1 - Symptomatic but completely ambulatory  Vitals:   01/03/20 1542  BP: (!) 142/74  Pulse: 70  Resp: 18  Temp: (!) 97.1 F (36.2 C)  SpO2: 100%   Wt Readings from Last 3 Encounters:  01/03/20 224 lb (101.6 kg)  12/20/19 217 lb (98.4 kg)  12/13/19 225 lb (102.1 kg)   Physical Exam Vitals reviewed.  Constitutional:      Appearance: Normal  appearance.  Neurological:     General: No focal deficit present.     Mental Status: He is alert and oriented to person, place, and time.  Psychiatric:        Mood and Affect: Mood normal.        Behavior: Behavior normal.      LABORATORY DATA:  I have reviewed the labs as listed.  CBC Latest Ref Rng & Units 12/20/2019 12/13/2019 09/23/2016  WBC 4.0 - 10.5 K/uL 6.0 5.3 5.4  Hemoglobin 13.0 - 17.0 g/dL 15.8 14.9 16.4  Hematocrit 39 - 52 % 47.3 43.7 47.4  Platelets 150 - 400 K/uL 282 224 200   CMP Latest Ref Rng & Units 12/20/2019 12/03/2019 09/23/2016  Glucose 70 - 99 mg/dL 124(H) - 110(H)  BUN 8 - 23 mg/dL 14 - 13  Creatinine 0.61 - 1.24 mg/dL 1.07 0.90 1.03  Sodium 135 - 145 mmol/L 137 - 140  Potassium 3.5 - 5.1 mmol/L 4.0 - 5.0  Chloride 98 - 111 mmol/L 97(L) - 103  CO2 22 - 32 mmol/L 26 - 28  Calcium 8.9 - 10.3 mg/dL 9.1 - 9.2  Total Protein 6.5 - 8.1 g/dL 7.8 - 7.2  Total Bilirubin 0.3 - 1.2 mg/dL 1.0 - 1.2  Alkaline Phos 38 - 126 U/L 107 - 70  AST 15 - 41 U/L 18 - 22  ALT 0 - 44 U/L 18 - 16(L)    DIAGNOSTIC IMAGING:  I have independently reviewed the scans and discussed with the patient. CT CHEST WO CONTRAST  Result Date: 12/10/2019 CLINICAL DATA:  Hepatic metastases of unknown primary.  Staging. EXAM: CT CHEST WITHOUT CONTRAST TECHNIQUE: Multidetector CT imaging of the chest was performed following the standard protocol without IV contrast. COMPARISON:   Abdominal MRI 12/05/2019. Abdomen pelvis CT 12/03/2019. FINDINGS: Cardiovascular: The heart size is normal. No substantial pericardial effusion. Coronary artery calcification is evident. Atherosclerotic calcification is noted in the wall of the thoracic aorta. Mediastinum/Nodes: Mediastinal lymphadenopathy evident. Index 1.3 cm short axis high right paratracheal node evident. 1.5 cm short axis low right paratracheal node associated. No evidence for gross hilar lymphadenopathy although assessment is limited by the lack of intravenous contrast on today's study. There is no axillary lymphadenopathy. The esophagus has normal imaging features. Lungs/Pleura: 1.9 cm spiculated pulmonary nodule is identified in the right lung apex (image 33/series 4). Subtle area of ground-glass nodularity identified inferior right upper lobe (image 76/4 and sagittal 75/6) likely reflecting infectious/inflammatory etiology. No suspicious pulmonary nodule or mass in the left lung. No focal airspace consolidation. No pleural effusion. Upper Abdomen: Better characterized on recent CT and MRI of the abdomen. Right adrenal nodule again noted. The upper pole left renal lesion seen on previous MRI is not well demonstrated on today's noncontrast CT. Musculoskeletal: No worrisome lytic or sclerotic osseous abnormality. IMPRESSION: 1. 1.9 cm spiculated pulmonary nodule in the right lung apex, highly suspicious for primary bronchogenic neoplasm. 2. Mediastinal lymphadenopathy, consistent with metastatic disease. 3. Subtle area of ground-glass nodularity inferior right upper lobe likely reflecting infectious/inflammatory etiology. Attention on follow-up recommended. 4. Right adrenal nodule, better characterized on recent CT and MRI of the abdomen. 5. Aortic Atherosclerosis (ICD10-I70.0). Electronically Signed   By: Misty [place] M.D.   On: 12/10/2019 05:23   MR Brain W Wo Contrast  Result Date: 12/28/2019 CLINICAL DATA:  Non-small cell lung cancer,  staging. EXAM: MRI HEAD WITHOUT AND WITH CONTRAST TECHNIQUE: Multiplanar, multiecho pulse sequences of the brain and surrounding structures were obtained  without and with intravenous contrast. CONTRAST:  28m GADAVIST GADOBUTROL 1 MMOL/ML IV SOLN COMPARISON:  None. FINDINGS: Brain: Lesions include: - Intrinsically T1 hyperintense 7 mm right cerebellar lesion with minimal enhancement and peripheral DWI hyperintensity (18:29). - Intrinsically T1 hyperintense 6 mm inferior left cerebellar lesion (18:16) without definite enhancement. No acute infarct. No SWI signal abnormality. No midline shift, ventriculomegaly or extra-axial fluid collection. Vascular: Normal flow voids. Skull and upper cervical spine: Enhancing 1.2 cm left clival lesion (18:22). Sinuses/Orbits: Sequela of bilateral lens replacement. Clear paranasal sinuses and mastoid air cells. Other: None. 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 No metastases within the cerebral hemispheres. Enhancing 1.2 cm left clival metastasis. These results will be called to the ordering clinician or representative by the Radiologist Assistant, and communication documented in the PACS or [employer] Electronically Signed   By: CPrimitivo GauzeM.D.   On: 12/28/2019 08:52   MR LIVER W WO CONTRAST  Result Date: 12/05/2019 CLINICAL DATA:  Characterize liver, adrenal, and renal lesions identified by prior CT. EXAM: MRI ABDOMEN WITHOUT AND WITH CONTRAST TECHNIQUE: Multiplanar multisequence MR imaging of the abdomen was performed both before and after the administration of intravenous contrast. CONTRAST:  156mGADAVIST GADOBUTROL 1 MMOL/ML IV SOLN COMPARISON:  CT abdomen pelvis, 12/03/2019 FINDINGS: Lower chest: No acute findings. Hepatobiliary: There is a rounded subcapsular lesion of the posteroinferior right lobe of the liver, hepatic segment VI, measuring 2.2 x 2.0 cm. This lesion demonstrates intrinsic intermediate T2 signal with continuous  rim hyperenhancement but no internal enhancement (series 18, image 50). There is a smaller lesion with similar characteristics in the anterior liver dome, hepatic segment VII, measuring 1.1 x 0.9 cm (series 18, image 26). There are additional subcentimeter fluid signal lesions, too small to characterize. Pancreas: No mass, inflammatory changes, or other parenchymal abnormality identified. Spleen:  Within normal limits in size and appearance. Adrenals/Urinary Tract: There is a rim enhancing lesion of the right adrenal gland measuring 3.0 x 2.5 cm (series 18, image 37). There is a an intrinsically T2 hypointense lesion of the superior pole of the left kidney which is somewhat ill-defined, again with rim enhancement measuring 1.9 x 1.7 cm (series 18, image 50). There are additional fluid signal simple cysts of the left kidney. No evidence of hydronephrosis. Stomach/Bowel: Visualized portions within the abdomen are unremarkable. Vascular/Lymphatic: No pathologically enlarged lymph nodes identified. Partially imaged infrarenal abdominal aortic aneurysm, better assessed on prior comparison CT. Other:  None. Musculoskeletal: No suspicious bone lesions identified. Incidental note of a fat containing right lumbar hernia (series 4, image 27). IMPRESSION: 1. There is a rim enhancing lesion of the right adrenal gland measuring 3.0 x 2.5 cm, consistent with metastatic disease. 2. There is rim enhancing lesion of the superior pole of the left kidney measuring 1.9 x 1.7 cm. 3. There is a rounded subcapsular lesion of the posteroinferior right lobe of the liver, hepatic segment VI, measuring 2.2 x 2.0 cm. This lesion demonstrates intrinsic intermediate T2 signal with continuous rim hyperenhancement but no internal enhancement. There is a smaller lesion with similar characteristics in the anterior liver dome, hepatic segment VII, measuring 1.1 x 0.9 cm. Additional subcentimeter lesions too small to characterize. 4. Constellation of  findings is most consistent with metastatic disease, however no primary lesion is identified in the abdomen. Consider additional primary search imaging of the chest as well as tissue sampling. 5. Partially imaged infrarenal abdominal aortic aneurysm, better assessed on prior CT. Aortic Atherosclerosis (ICD10-I70.0). These results  will be called to the ordering clinician or representative by the Radiologist Assistant, and communication documented in the PACS or [employer]. Electronically Signed   By: Eddie Candle M.D.   On: 12/05/2019 16:36   [place] CORE BIOPSY (LIVER)  Result Date: 12/13/2019 INDICATION: Remote history of prostate cancer. Otherwise, no known primary, now with multiple liver lesions worrisome for metastatic disease. Please from ultrasound-guided biopsy for tissue diagnostic purposes. EXAM: ULTRASOUND GUIDED LIVER LESION BIOPSY COMPARISON:  Abdominal MRI-12/05/2019; CT abdomen and pelvis-12/03/2019 MEDICATIONS: None ANESTHESIA/SEDATION: Fentanyl 50 mcg IV; Versed 1 mg IV Total Moderate Sedation time:  12 Minutes. The patient's level of consciousness and vital signs were monitored continuously by radiology nursing throughout the procedure under my direct supervision. COMPLICATIONS: None immediate. PROCEDURE: Informed written consent was obtained from the patient after a discussion of the risks, benefits and alternatives to treatment. The patient understands and consents the procedure. A timeout was performed prior to the initiation of the procedure. Ultrasound scanning was performed of the right upper abdominal quadrant demonstrates an approximately 2.1 x 2.1 cm isoechoic lesion within the inferior medial subcapsular aspect the right lobe of the liver correlating with the ill-defined lesion seen on preceding abdominal CT image 38, series 11. Procedure was planned. The right upper abdominal quadrant was prepped and draped in the usual sterile fashion. The overlying soft tissues were anesthetized  with 1% lidocaine with epinephrine. A 17 gauge, 6.8 cm co-axial needle was advanced into a peripheral aspect of the lesion. This was followed by 6 core biopsies with an 18 gauge core device under direct ultrasound guidance. The coaxial needle tract was embolized with a small amount of Gel-Foam slurry and superficial hemostasis was obtained with manual compression. Post procedural scanning was negative for definitive area of hemorrhage or additional complication. A dressing was placed. The patient tolerated the procedure well without immediate post procedural complication. IMPRESSION: Technically successful ultrasound guided core needle biopsy of indeterminate lesion within the caudal aspect of the right lobe of the liver. Electronically Signed   By: Sandi Mariscal M.D.   On: 12/13/2019 16:29     ASSESSMENT:  1. 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 Jenetta Downer. -CT abdomen on 12/03/2019 showed posterior right lobe lesion measuring 2.1 x 1.9 cm and another anterior liver dome lesion measuring 1.3 x 1.2 cm. Right adrenal mass measuring 3.3 x 1.8 cm. -MRI of the liver on 12/05/2019 showed rim-enhancing lesion of the right adrenal gland measuring 3.0 x 2.5 cm. There is rim-enhancing lesion of the superior pole of the left kidney measuring 1.9 x 1.7 cm. Right lobe of the liver lesion measuring 2.2 x 2.0 cm. Anterior liver dome lesion measuring 1.1 x 0.9 cm. -CT chest without contrast on 12/07/2019 showed 1.9 cm spiculated nodule in the right lung apex. Right paratracheal lymph node 1.3 cm. 1.5 cm lower right paratracheal node. -Right lobe of liver needle biopsy consistent with adenocarcinoma, positive for CK7, TTF-1. Negative for CK20, CDX2, Napsin a and CK 5/6. -PD-L1 TPS 1% -Foundation 1 with TMB high, MS-stable, no other targetable mutations.  2. Prostate cancer: -Diagnosed in 2017, seed implants done in June 2018.  3.  Social/family history: -He drives a Teacher, English as a foreign language. Quit smoking in 1992, 2 packs/day for 32 years. -Brother had prostate cancer. Sister had skin cancer and mother had throat cancer.  4.  Brain metastasis: -MRI of the brain on 12/28/2019 shows bilateral cerebellar metastasis measuring 6 to  7 mm.  Enhancing 1.2 cm left clival metastasis.   PLAN:  1. 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 -I have recommended chemoimmunotherapy with carboplatin, pemetrexed and pembrolizumab every 3 weeks. -We will request port placement. -He will receive B12 injection today.  We will also send a folic acid prescription. -He was encouraged to take COVID-19 vaccine and flu vaccine. -We will see him back in 2 weeks to initiate his therapy.  2.  Brain metastasis: -He has 2 subcentimeter bilateral cerebellar metastasis and left clival bone met. -I have recommended stereotactic radiation surgery. -We will make referral to [facility] radiation oncology.   Orders placed this encounter:  No orders of the defined types were placed in this encounter.    [name], MD [place] [phone]   I, Milinda Antis, am acting as a scribe for Dr. Sanda Linger.  I, [name] MD, have reviewed the above documentation for accuracy and completeness, and I agree with the above.

## 2020-01-04 ENCOUNTER — Ambulatory Visit: Payer: Medicare Other | Admitting: Urology

## 2020-01-07 ENCOUNTER — Telehealth (HOSPITAL_COMMUNITY): Payer: Self-pay

## 2020-01-07 NOTE — Telephone Encounter (Signed)
Patient's wife called stating that pt is going for appointment with Rheumatology this morning. She states that he is to get a steroid shot in his hip and shoulder. Patient is wanting to know if this is okay since he is having radiation and chemo this week.    Dr. Raliegh Ip states that if it is just steroid injections he should be fine.  Pt wife notified of Dr. Marthann Schiller response.

## 2020-01-08 ENCOUNTER — Telehealth (HOSPITAL_COMMUNITY): Payer: Self-pay

## 2020-01-08 ENCOUNTER — Inpatient Hospital Stay (HOSPITAL_COMMUNITY): Payer: Medicare Other | Admitting: General Practice

## 2020-01-08 DIAGNOSIS — C3491 Malignant neoplasm of unspecified part of right bronchus or lung: Secondary | ICD-10-CM

## 2020-01-08 NOTE — Telephone Encounter (Signed)
Patient's wife called stating that patient is having lower back pain that radiates to left side navel area. Patient's wife is wanting to see if Dr. Raliegh Ip will send him in some pain medication.

## 2020-01-08 NOTE — Progress Notes (Signed)
[place] Initial Psychosocial Assessment Clinical Social Work  Clinical Social Work contacted by phone to assess psychosocial, emotional, mental health, and spiritual needs of the patient.   Barriers to care/review of distress screen:  - Transportation:  Do you anticipate any problems getting to appointments?  Do you have someone who can help run errands for you if you need it?  No issues, both are able to drive, no problems w affording gas - Help at home:  What is your living situation (alone, family, other)?  If you are physically unable to care for yourself, who would you call on to help you?  Lives w wife.  Has help at home if needed.   - Support system:  What does your support system look like?  Who would you call on if you needed some kind of practical help?  What if you needed someone to talk to for emotional support?  Has family nearby, plenty of friends, both sides of family and church family are available to them.  "The Reita Cliche is in control."  - Finances:  Are you concerned about finances.  Considering returning to work?  If not, applying for disability?  OK w finances, is retired by working a few days/week as a Administrator.    ONCBCN DISTRESS SCREENING 06/18/2016  Screening Type Initial Screening  Distress experienced in past week (1-10) 5  Emotional problem type Adjusting to illness  Spiritual/Religous concerns type Relating to God  Information Concerns Type Lack of info about treatment  Physical Problem type Changes in urination;Sexual problems  Referral to support programs Yes    What is your understanding of where you are with your cancer? Its cause?  Your treatment plan and what happens next?  Was having pain in upper chest, went to PCP who referred him for CT Scan and nephrologist, then multiple other doctors.  Will have chemo education on Thursday, port placed, begin chemo shortly thereafter.  Will also need radiation.  Strong faith background.    "Dont need to know tomorrow or the  future, its in God's hands."    What are your worries for the future as you begin treatment for cancer?  Doesn't want to be sick, how it will affect him physically. Needs to be up and around, able to be outside, playing softball, driving his antique cars. "I know this is depressing for him, he doesn't lay around."    What are your hopes and priorities during your treatment? What is important to you? What are your goals for your care?  Likes to be up and active, lower back pain is :"giving him a fit", "it hurts so bad..."  Cannot function w level of back pain he has right now.  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    CSW Summary:  Patient and family psychosocial functioning including strengths, limitations, and coping skills:  74 year old married male, newly diagnosed w lung cancer.  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  At this point, pain is the most significant issue for him.  As a truck driver, he could not take controlled substances for pain mgmt.  Dr Delton Coombes has told him not work work "for a while", and his pain is significant.  His major current issue is severe pain which is limiting his ability to "do much other  than sitting on the couch w a heating pad."  Wife has called for help w pain management from oncologist, waiting for call back.  They have strong support from family, friends, church, fellow truck drivers.  He is used to being very active and engaged in activities that are pleasurable for him.  Wife is "not sure what to expect", but aware that he may need increased support throughout treatment.    Identifications of barriers to care:  Pain control  Availability of community resources:  Will communicate w medical team for their determination of how to address this need.  Otherwise has good  support  Clinical Social Worker follow up needed: No.   Edwyna Shell, [place], [place] Worker Phone:  [phone]  Clinical Social Worker Phone:  [phone]

## 2020-01-10 ENCOUNTER — Other Ambulatory Visit (HOSPITAL_COMMUNITY): Payer: Self-pay | Admitting: Nurse Practitioner

## 2020-01-10 ENCOUNTER — Encounter (HOSPITAL_COMMUNITY): Payer: Self-pay

## 2020-01-10 ENCOUNTER — Ambulatory Visit (INDEPENDENT_AMBULATORY_CARE_PROVIDER_SITE_OTHER): Payer: Medicare Other | Admitting: General Surgery

## 2020-01-10 ENCOUNTER — Encounter: Payer: Self-pay | Admitting: General Surgery

## 2020-01-10 ENCOUNTER — Inpatient Hospital Stay (HOSPITAL_COMMUNITY): Payer: Medicare Other

## 2020-01-10 ENCOUNTER — Telehealth: Payer: Self-pay | Admitting: Radiation Oncology

## 2020-01-10 ENCOUNTER — Other Ambulatory Visit (HOSPITAL_COMMUNITY)
Admission: RE | Admit: 2020-01-10 | Discharge: 2020-01-10 | Disposition: A | Discharge status: Home / Self Care | Payer: Medicare Other | Source: Ambulatory Visit | Attending: General Surgery | Admitting: General Surgery

## 2020-01-10 ENCOUNTER — Other Ambulatory Visit: Payer: Self-pay

## 2020-01-10 VITALS — BP 122/78 | HR 75 | Temp 98.3°F | Resp 16 | Ht 73.0 in | Wt 220.0 lb

## 2020-01-10 DIAGNOSIS — C3491 Malignant neoplasm of unspecified part of right bronchus or lung: Secondary | ICD-10-CM

## 2020-01-10 DIAGNOSIS — Z01812 Encounter for preprocedural laboratory examination: Secondary | ICD-10-CM | POA: Insufficient documentation

## 2020-01-10 DIAGNOSIS — Z95828 Presence of other vascular implants and grafts: Secondary | ICD-10-CM

## 2020-01-10 DIAGNOSIS — Z20822 Contact with and (suspected) exposure to covid-19: Secondary | ICD-10-CM | POA: Diagnosis not present

## 2020-01-10 HISTORY — DX: Presence of other vascular implants and grafts: Z95.828

## 2020-01-10 LAB — SARS CORONAVIRUS 2 (TAT 6-24 HRS): SARS Coronavirus 2: NEGATIVE

## 2020-01-10 MED ORDER — HYDROCODONE-ACETAMINOPHEN 5-325 MG PO TABS: 1.0000 | ORAL_TABLET | Freq: Three times a day (TID) | ORAL | 0 refills | Status: DC | PRN

## 2020-01-10 MED ORDER — LIDOCAINE-PRILOCAINE 2.5-2.5 % EX CREA
TOPICAL_CREAM | CUTANEOUS | 3 refills | Status: AC
Start: 2020-01-10 — End: ?

## 2020-01-10 MED ORDER — PROCHLORPERAZINE MALEATE 10 MG PO TABS: 10.0000 mg | ORAL_TABLET | Freq: Four times a day (QID) | ORAL | 1 refills | Status: DC | PRN

## 2020-01-10 NOTE — Patient Instructions (Signed)
Your procedure is scheduled on: 01/14/2020  Report to [facility] at   6:15  AM.  Call this number if you have problems the morning of surgery: [phone]   Remember:   Do not Eat or Drink after midnight         No Smoking the morning of surgery  :  Take these medicines the morning of surgery with A SIP OF WATER: omeprazole   Do not wear jewelry, make-up or nail polish.  Do not wear lotions, powders, or perfumes. You may wear deodorant.  Do not shave 48 hours prior to surgery. Men may shave face and neck.  Do not bring valuables to the hospital.  Contacts, dentures or bridgework may not be worn into surgery.  Leave suitcase in the car. After surgery it may be brought to your room.  For patients admitted to the hospital, checkout time is 11:00 AM the day of discharge.   Patients discharged the day of surgery will not be allowed to drive home.    Special Instructions: Shower using CHG night before surgery and shower the day of surgery use CHG.  Use special wash - you have one bottle of CHG for all showers.  You should use approximately 1/2 of the bottle for each shower.  Implanted Port Insertion, Care After This sheet gives you information about how to care for yourself after your procedure. Your health care provider may also give you more specific instructions. If you have problems or questions, contact your health care provider. What can I expect after the procedure? After the procedure, it is common to have:  Discomfort at the port insertion site.  Bruising on the skin over the port. This should improve over 3-4 days. Follow these instructions at home: [facility] care  After your port is placed, you will get a manufacturer's information card. The card has information about your port. Keep this card with you at all times.  Take care of the port as told by your health care provider. Ask your health care provider if you or a family member can get training for taking care of the port  at home. A home health care nurse may also take care of the port.  Make sure to remember what type of port you have. Incision care      Follow instructions from your health care provider about how to take care of your port insertion site. Make sure you: ? Wash your hands with soap and water before and after you change your bandage (dressing). If soap and water are not available, use hand sanitizer. ? Change your dressing as told by your health care provider. ? Leave stitches (sutures), skin glue, or adhesive strips in place. These skin closures may need to stay in place for 2 weeks or longer. If adhesive strip edges start to loosen and curl up, you may trim the loose edges. Do not remove adhesive strips completely unless your health care provider tells you to do that.  Check your port insertion site every day for signs of infection. Check for: ? Redness, swelling, or pain. ? Fluid or blood. ? Warmth. ? Pus or a bad smell. Activity  Return to your normal activities as told by your health care provider. Ask your health care provider what activities are safe for you.  Do not lift anything that is heavier than 10 lb (4.5 kg), or the limit that you are told, until your health care provider says that it is safe. General  instructions  Take over-the-counter and prescription medicines only as told by your health care provider.  Do not take baths, swim, or use a hot tub until your health care provider approves. Ask your health care provider if you may take showers. You may only be allowed to take sponge baths.  Do not drive for 24 hours if you were given a sedative during your procedure.  Wear a medical alert bracelet in case of an emergency. This will tell any health care providers that you have a port.  Keep all follow-up visits as told by your health care provider. This is important. Contact a health care provider if:  You cannot flush your port with saline as directed, or you cannot  draw blood from the port.  You have a fever or chills.  You have redness, swelling, or pain around your port insertion site.  You have fluid or blood coming from your port insertion site.  Your port insertion site feels warm to the touch.  You have pus or a bad smell coming from the port insertion site. Get help right away if:  You have chest pain or shortness of breath.  You have bleeding from your port that you cannot control. Summary  Take care of the port as told by your health care provider. Keep the manufacturer's information card with you at all times.  Change your dressing as told by your health care provider.  Contact a health care provider if you have a fever or chills or if you have redness, swelling, or pain around your port insertion site.  Keep all follow-up visits as told by your health care provider. This information is not intended to replace advice given to you by your health care provider. Make sure you discuss any questions you have with your health care provider. Document Revised: 10/25/2017 Document Reviewed: 10/25/2017 Elsevier Patient Education  [place] After These instructions provide you with information about caring for yourself after your procedure. Your health care provider may also give you more specific instructions. Your treatment has been planned according to current medical practices, but problems sometimes occur. Call your health care provider if you have any problems or questions after your procedure. What can I expect after the procedure? After your procedure, you may:  Feel sleepy for several hours.  Feel clumsy and have poor balance for several hours.  Feel forgetful about what happened after the procedure.  Have poor judgment for several hours.  Feel nauseous or vomit.  Have a sore throat if you had a breathing tube during the procedure. Follow these instructions at home: For at least 24  hours after the procedure:      Have a responsible adult stay with you. It is important to have someone help care for you until you are awake and alert.  Rest as needed.  Do not: ? Participate in activities in which you could fall or become injured. ? Drive. ? Use heavy machinery. ? Drink alcohol. ? Take sleeping pills or medicines that cause drowsiness. ? Make important decisions or sign legal documents. ? Take care of children on your own. Eating and drinking  Follow the diet that is recommended by your health care provider.  If you vomit, drink water, juice, or soup when you can drink without vomiting.  Make sure you have little or no nausea before eating solid foods. General instructions  Take over-the-counter and prescription medicines only as told by your health care  provider.  If you have sleep apnea, surgery and certain medicines can increase your risk for breathing problems. Follow instructions from your health care provider about wearing your sleep device: ? Anytime you are sleeping, including during daytime naps. ? While taking prescription pain medicines, sleeping medicines, or medicines that make you drowsy.  If you smoke, do not smoke without supervision.  Keep all follow-up visits as told by your health care provider. This is important. Contact a health care provider if:  You keep feeling nauseous or you keep vomiting.  You feel light-headed.  You develop a rash.  You have a fever. Get help right away if:  You have trouble breathing. Summary  For several hours after your procedure, you may feel sleepy and have poor judgment.  Have a responsible adult stay with you for at least 24 hours or until you are awake and alert. This information is not intended to replace advice given to you by your health care provider. Make sure you discuss any questions you have with your health care provider. Document Revised: 06/27/2017 Document Reviewed:  07/20/2015 Elsevier Patient Education  [place].

## 2020-01-10 NOTE — H&P (Signed)
Lance Stafford; [id number]; [birth year]   HPI Patient is a 75 year old white male who was referred to my care by Dr. Delton Coombes for Port-A-Cath placement.  Patient has stage IV right lung carcinoma and is about to undergo chemotherapy. Past Medical History:  Diagnosis Date   Arthritis    Chronic low back pain    truck driver   GERD (gastroesophageal reflux disease)    Hyperlipidemia    Nocturia    Port-A-Cath in place 01/10/2020   Prostate cancer ([place]) UROLOGIST-  DR WRENN/  ONCOLOGIST-  DR MANNING   dx 02/ 2017via [facility]---  Stage T1c,  Gleason 3+3,  PSA 11.9   Wears glasses    Wears partial dentures    upper and lower    Past Surgical History:  Procedure Laterality Date   CATARACT EXTRACTION W/ INTRAOCULAR LENS  IMPLANT, BILATERAL  2015   CHOLECYSTECTOMY     CYSTOSCOPY  09/30/2016   Procedure: CYSTOSCOPY;  Surgeon: Irine Seal, MD;  Location: [facility];  Service: Urology;;  no seeds found in bladder   RADIOACTIVE SEED IMPLANT N/A 09/30/2016   Procedure: RADIOACTIVE SEED IMPLANT/BRACHYTHERAPY IMPLANT, SPACE OAR;  Surgeon: Irine Seal, MD;  Location: [facility];  Service: Urology;  Laterality: N/A;  70 seeds implanted   SPERMATOCELECTOMY Left 01/30/2015   Procedure: SPERMATOCELECTOMY;  Surgeon: Irine Seal, MD;  Location: [facility];  Service: Urology;  Laterality: Left;    Family History  Problem Relation Age of Onset   Diabetes Mother    Stroke Mother    Cancer Father        throat   Cancer Sister        skin   Cancer Brother        prostate   Cancer Paternal Grandmother    Clotting disorder Daughter     Current Outpatient Medications on File Prior to Visit  Medication Sig Dispense Refill   aspirin EC 81 MG tablet Take 81 mg by mouth daily.      atorvastatin (LIPITOR) 40 MG tablet Take 40 mg by mouth daily.     Docusate Calcium (STOOL SOFTENER PO) Take 2 tablets by mouth daily.     folic acid  (FOLVITE) 1 MG tablet Take 1 tablet (1 mg total) by mouth daily. 90 tablet 1   Ibuprofen (ADVIL PO) Take 1 tablet by mouth as needed.     nabumetone (RELAFEN) 500 MG tablet Take 500 mg by mouth 2 (two) times daily.     omeprazole (PRILOSEC) 40 MG capsule Take 40 mg by mouth daily.     [START ON 01/17/2020] CARBOPLATIN IV Inject into the vein every 21 ( twenty-one) days.     lidocaine-prilocaine (EMLA) cream Apply a small amount to port a cath site and cover with plastic wrap 1 hour prior to chemotherapy appointments 30 g 3   PEMEtrexed 500 mg/m2 in sodium chloride 0.9 % 100 mL Inject 500 mg/m2 into the vein every 21 ( twenty-one) days.     prochlorperazine (COMPAZINE) 10 MG tablet Take 1 tablet (10 mg total) by mouth every 6 (six) hours as needed (Nausea or vomiting). 30 tablet 1   [START ON 01/17/2020] sodium chloride 0.9 % SOLN 50 mL with pembrolizumab 100 MG/4ML SOLN 2 mg/kg Inject 200 mg into the vein every 21 ( twenty-one) days.     No current facility-administered medications on file prior to visit.    No Known Allergies  Social History   Substance and Sexual Activity  Alcohol Use No    Social History   Tobacco Use  Smoking Status Former Smoker   Packs/day: 2.00   Years: 33.00   Pack years: 66.00   Types: Cigarettes   Quit date: 01/23/1991   Years since quitting: 28.9  Smokeless Tobacco Never Used    Review of Systems  Constitutional: Positive for malaise/fatigue.  HENT: Negative.   Respiratory: Positive for shortness of breath.   Cardiovascular: Positive for chest pain.  Gastrointestinal: Positive for heartburn.  Genitourinary: Positive for frequency.  Musculoskeletal: Positive for back pain, joint pain and neck pain.  Skin: Negative.   Neurological: Negative.   Endo/Heme/Allergies: Negative.   Psychiatric/Behavioral: Negative.     Objective   Vitals:   01/10/20 0854  BP: 122/78  Pulse: 75  Resp: 16  Temp: 98.3 F (36.8 C)  SpO2: 97%     Physical Exam Vitals reviewed.  Constitutional:      Appearance: Normal appearance. He is normal weight. He is not ill-appearing.  HENT:     Head: Normocephalic and atraumatic.  Cardiovascular:     Rate and Rhythm: Normal rate and regular rhythm.     Heart sounds: Normal heart sounds. No murmur heard.  No friction rub. No gallop.   Pulmonary:     Effort: Pulmonary effort is normal. No respiratory distress.     Breath sounds: Normal breath sounds. No stridor. No wheezing, rhonchi or rales.  Skin:    General: Skin is warm and dry.  Neurological:     Mental Status: He is alert and oriented to person, place, and time.    Dr. Tomie China notes reviewed Assessment  Stage IV right lung carcinoma, need for central venous access Plan   Patient is scheduled for Port-A-Cath insertion on 01/14/2020.  The risks and benefits of the procedure including bleeding, infection, and pneumothorax were fully explained to the patient, who gave informed consent.

## 2020-01-10 NOTE — Telephone Encounter (Signed)
Received voicemail message from patient that he has questions about his appointment with Dr. Tammi Klippel on 01/22/2020. Phoned to inquire. No answer. Left voicemail message with my direct number encouraging a return call so I can answer his questions.

## 2020-01-10 NOTE — Patient Instructions (Signed)
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  You will be treated in the clinic every 3 weeks with a combination of chemotherapy and immunotherapy drugs.  Those drugs are pemetrexed (Alimta), carboplatin, and pembrolizumab (Keytruda).  The intent of treatment is to control your cancer, prevent it from spreading further, and to alleviate any symptoms you may be having related to your disease.  You will see the doctor regularly throughout treatment.  We will obtain blood work from you prior to every treatment and monitor your results to make sure it is safe to give your treatment. The doctor monitors your response to treatment by the way you are feeling, your blood work, and by obtaining scans periodically.  There will be wait times while you are here for treatment.  It will take about 30 minutes to 1 hour for your lab work to result.  Then there will be wait times while pharmacy mixes your medications.   You must take folic acid every day by mouth beginning 7 days before your first dose of alimta.  You must keep taking folic acid every day during the time you are being treated with alimta, and for every day for 21 days after you receive your last alimta dose.    You will get B12 injections while you are on alimta.  The first one about 1 week prior to starting treatment and then about every 9 weeks during treatment.   Medications you will receive in the clinic prior to your chemotherapy medications:  Aloxi:  ALOXI is used in adults to help prevent nausea and vomiting that happens with certain chemotherapy drugs.  Aloxi is a long acting medication, and will remain in your system for about two days.   Emend:  This is an anti-nausea medication that is used with Aloxi to help prevent nausea and vomiting caused by chemotherapy.  Dexamethasone:  This is a steroid given prior to chemotherapy to help prevent allergic reactions; it may also  help prevent and control nausea and diarrhea.    Carboplatin (Generic Name) Other Names: Paraplatin, CBDCA  About This Drug Carboplatin is a drug used to treat cancer. This drug is given in the vein (IV).  Takes 30 minutes to infuse.   Possible Side Effects (More Common) . Nausea and throwing up (vomiting). These symptoms may happen within a few hours after your treatment and may last up to 24 hours. Medicines are available to stop or lessen these side effects.  . Bone marrow depression. This is a decrease in the number of white blood cells, red blood cells, and platelets. This may raise your risk of infection, make you tired and weak (fatigue), and raise your risk of bleeding.  . Soreness of the mouth and throat. You may have red areas, white patches, or sores that hurt.  . This drug may affect how your kidneys work. Your kidney function will be checked as needed.  . Electrolyte changes. Your blood will be checked for electrolyte changes as needed.  Possible Side Effects (Less Common)  . Hair loss. Some patients lose their hair on the scalp and body. You may notice your hair thinning seven to 14 days after getting this drug.  . Effects on the nerves are called peripheral neuropathy. You may feel numbness, tingling, or pain in your hands and feet. It may be hard for you to button your clothes, open jars, or walk as usual. The effect on the  nerves may get worse with more doses of the drug. These effects get better in some people after the drug is stopped but it does not get better in all people.  . Loose bowel movements (diarrhea) that may last for several days  . Decreased hearing or ringing in the ears  . Changes in the way food and drinks taste  . Changes in liver function. Your liver function will be checked as needed  Allergic Reactions Serious allergic reactions including anaphylaxis are rare. While you are getting this drug in your vein (IV), tell your nurse right away if  you have any of these symptoms of an allergic reaction:  . Trouble catching your breath  . Feeling like your tongue or throat are swelling  . Feeling your heart beat quickly or in a not normal way (palpitations)  . Feeling dizzy or lightheaded  . Flushing, itching, rash, and/or hives  Treating Side Effects  . Drink 6-8 cups of fluids each day unless your doctor has told you to limit your fluid intake due to some other health problem. A cup is 8 ounces of fluid. If you throw up or have loose bowel movements, you should drink more fluids so that you do not become dehydrated (lack water in the body from losing too much fluid).  . Mouth care is very important. Your mouth care should consist of routine, gentle cleaning of your teeth or dentures and rinsing your mouth with a mixture of 1/2 teaspoon of salt in 8 ounces of water or  teaspoon of baking soda in 8 ounces of water. This should be done at least after each meal and at bedtime.  . If you have mouth sores, avoid mouthwash that has alcohol. Avoid alcohol and smoking because they can bother your mouth and throat.  . If you have numbness and tingling in your hands and feet, be careful when cooking, walking, and handling sharp objects and hot liquids.  . Talk with your nurse about getting a wig before you lose your hair. Also, call the [place] at 800-ACS-2345 to find out information about the "Look Good, Feel Better" program close to where you live. It is a free program where women getting chemotherapy can learn about wigs, turbans and scarves as well as makeup techniques and skin and nail care.  Food and Drug Interactions There are no known interactions of carboplatin with food. This drug may interact with other medicines. Tell your doctor and pharmacist about all the medicines and dietary supplements (vitamins, minerals, herbs and others) that you are taking at this time. The safety and use of dietary supplements and  alternative diets are often not known. Using these might affect your cancer or interfere with your treatment. Until more is known, you should not use dietary supplements or alternative diets without your cancer doctor's help.  When to Call the Doctor Call your doctor or nurse right away if you have any of these symptoms:  . Fever of 100.4 F (38 C) or above; chills  . Bleeding or bruising that is not normal  . Wheezing or trouble breathing  . Nausea that stops you from eating or drinking  . Vomiting  . Rash or itching  . Loose bowel movements (diarrhea) more than four times a day or diarrhea with weakness or feeling lightheaded  Call your doctor or nurse as soon as possible if any of these symptoms happen:  . Numbness, tingling, decreased feeling or weakness in fingers, toes, arms, or  legs  . Change in hearing, ringing in the ears  . Blurred vision or other changes in eyesight  . Decreased urine  . Yellowing of skin or eyes  Sexual Problems and Reproductive Concerns Sexual problems and reproduction concerns may happen. In both men and women, this drug may affect your ability to have children. This cannot be determined before your treatment. Talk with your doctor or nurse if you plan to have children. Ask for information on sperm or egg banking. In men, this drug may interfere with your ability to make sperm, but it should not change your ability to have sexual relations. In women, menstrual bleeding may become irregular or stop while you are getting this drug. Do not assume that you cannot become pregnant if you do not have a menstrual period. Women may go through signs of menopause (change of life) like vaginal dryness or itching. Vaginal lubricants can be used to lessen vaginal dryness, itching, and pain during sexual relations. Genetic counseling is available for you to talk about the effects of this drug therapy on future pregnancies. Also, a genetic counselor can look at the  possible risk of problems in the unborn baby due to this medicine if an exposure happens during pregnancy.  . Pregnancy warning: This drug may have harmful effects on the unborn child, so effective methods of birth control should be used during your cancer treatment.  . Breast feeding warning: It is not known if this drug passes into breast milk. For this reason, women should talk to their doctor about the risks and benefits of breast feeding during treatment with this drug because this drug may enter the breast milk and badly harm a breast feeding baby.   Pemetrexed (Generic Name) Other Names: ALIMTA  About This Drug Pemetrexed is used to treat cancer. It is given in the vein (IV).  Takes 10 minutes to infuse.   Possible Side Effects (More Common)  . Bone marrow depression. This is a decrease in the number of white blood cells, red blood cells, and platelets. This may raise your risk of infection, make you feel tired and weak (fatigue), and raise your risk of bleeding.  . Fatigue  . Soreness of the mouth and throat. You may have red areas, white patches, or sores that hurt.  Possible Side Effects (less common)  . Trouble breathing or feeling short of breath  . Nausea and vomiting  . Skin rash  Treating Side Effects  . Ask your doctor or nurse about medicine that is available to help stop or lessen nausea and throwing up.  . If you get a rash, do not put anything on it unless your doctor or nurse says you may. Keep the area around the rash clean and dry.  . Mouth care is very important. Your mouth care should consist of routine, gentle cleaning of your teeth or dentures and rinsing your mouth with a mixture of 1/2 teaspoon of salt in 8 ounces of water or  teaspoon of baking soda in 8 ounces of water. This should be done at least after each meal and at bedtime.  . If you have mouth sores, avoid mouthwash that has alcohol. Avoid alcohol and smoking because they can bother your  mouth and throat.  Food and Drug Interactions There are no known interactions of this medicine with food. Tell your doctor if you are taking ibuprofen. Pemetrexed may interact with other medicines. Tell your doctor and pharmacist about all the medicines and dietary supplements (  vitamins, minerals, herbs and others) that you are taking at this time. The safety and use of dietary supplements and alternative diets are often not known. Using these might affect your cancer or interfere with your treatment. Until more is known, you should not use dietary supplements or alternative diets without your cancer doctor's help.  When to Call the Doctor Call your doctor or nurse right away if you have any of these symptoms:  . Temperature of 100.4 F (38 C) or above  . Chills  . Easy bruising or bleeding  . Trouble breathing  . Chest pain  . Nausea that stops you from eating or drinking  . Throwing up/vomiting  Call your doctor or nurse as soon as possible if you have any of these symptoms:  . Rash that does not go away with prescribed medicine  . Nausea or vomiting that does not go away with prescribed medicine  . Extreme fatigue that interferes with normal activities  Reproduction Concerns . Pregnancy warning: This drug may have harmful effects on the unborn child, so effective methods of birth control should be used during your cancer treatment.  . Genetic counseling is available for you to talk about the effects of this drug therapy on future pregnancies. Also, a genetic counselor can look at the possible risk of problems in the unborn baby due to this medicine if an exposure happens during pregnancy.  . Breast feeding warning: It is not known if this drug passes into breast milk. For this reason, women should talk to their doctor about the risks and benefits of breast feeding during treatment with this drug because this drug may enter the breast milk and badly harm a breast feeding  baby.   Pembrolizumab Beryle Flock)  About This Drug Pembrolizumab is used to treat cancer. It is given in the vein (IV).  This drug will take 30 minutes to infuse.  Possible Side Effects . Tiredness . Fever . Nausea . Decreased appetite (decreased hunger) . Loose bowel movements (diarrhea) . Constipation (not able to move bowels) . Trouble breathing . Rash . Itching . Muscle and bone pain . Cough  Note: Each of the side effects above was reported in 20% or greater of patients treated with pembrolizumab. Not all possible side effects are included above.  Warnings and Precautions  . This drug works with your immune system and can cause inflammation in any of your organs and tissues and can change how they work. This may put you at risk for developing serious medical problems which can very rarely be fatal.  . Colitis (swelling or inflammation in the colon) - symptoms are loose bowel movements (diarrhea) stomach cramping, and sometimes blood in the stool  . Changes in liver function. Your liver function will be checked regularly.  . Changes in kidney function, which can very rarely be fatal. Your kidney function will be checked regularly.  . Inflammation (swelling) of the lungs which can very rarely be fatal - you may have a dry cough or trouble breathing.  . This drug may affect some of your hormone glands (especially the thyroid, adrenals, pituitary and pancreas). Your hormone levels will be checked regularly.  . Blood sugar levels may change and you may develop diabetes. If you already have diabetes, changes may need to be made to your diabetes medication.  . Severe allergic skin reaction, which can very rarely be fatal. You may develop blisters on your skin that are filled with fluid or a severe red rash  all over your body that may be painful.  . Increased risk of organ rejection in patients who have received donor organs  . Increased risk of complications in patients who  will undergo a stem cell transplant after receiving pembrolizumab.  . While you are getting this drug in your vein (IV), you may have a reaction to the drug. Your nurse will check you closely for these signs: fever or shaking chills, flushing, facial swelling, feeling dizzy, headache, trouble breathing, rash, itching, chest tightness, or chest pain. These reactions may occur after your infusion. If this happens, call 911 for emergency care.  Important Information This drug may be present in the saliva, tears, sweat, urine, stool, vomit, semen, and vaginal secretions. Talk to your doctor and/or your nurse about the necessary precautions to take during this time.  Treating Side Effects  . Ask your doctor or nurse about medicines that are available to help stop or lessen constipation, diarrhea and/or nausea.  . Drink plenty of fluids (a minimum of eight glasses per day is recommended).  . If you are not able to move your bowels, check with your doctor or nurse before you use any enemas, laxatives, or suppositories  . To help with nausea and vomiting, eat small, frequent meals instead of three large meals a day. Choose foods and drinks that are at room temperature. Ask your nurse or doctor about other helpful tips and medicine that is available to help or stop lessen these symptoms.  . If you get diarrhea, eat low-fiber foods that are high in protein and calories and avoid foods that can irritate your digestive tracts or lead to cramping. Ask your nurse or doctor about medicine that can lessen or stop your diarrhea.  . Manage tiredness by pacing your activities for the day. Be sure to include periods of rest between energy-draining activities  . Keeping your pain under control is important to your wellbeing. Please tell your doctor or nurse if you are experiencing pain.  . If you have diabetes, keep good control of your blood sugar level. Tell your nurse or your doctor if your glucose levels are  higher or lower than normal  . If you get a rash do not put anything on it unless your doctor or nurse says you may. Keep the area around the rash clean and dry. Ask your doctor for medicine if your rash bothers you.  . Infusion reactions may happen for 24 hours after your infusion. If this happens, call 911 for emergency care.  Food and Drug Interactions  . There are no known interactions of pembrolizumab with food.  . There are no known interactions of pembrolizumab with other medications.  . Tell your doctor and pharmacist about all the medicines and dietary supplements (vitamins, minerals, herbs and others) that you are taking at this time. The safety and use of dietary supplements and alternative agents are often not known. Using these might affect your cancer or interfere with your treatment. Until more is known, you should not use dietary supplements or alternative agents without your cancer doctor's help.   When to Call the Doctor Call your doctor or nurse if you have any of the following symptoms and/or any new or unusual symptoms:  . Fever of 100.4 F (38 C) or higher  . Chills  . Wheezing or trouble breathing  . Rash or itching  . Feeling dizzy or lightheaded  . Loose bowel movements (diarrhea) more than 4 times a day or diarrhea with  weakness or lightheadedness, or diarrhea that is not controlled by medications  . Nausea that stops you from eating or drinking, and/or that is not relieved by prescribed medicines  . Lasting loss of appetite or rapid weight loss of five pounds in a week  . Fatigue that interferes with your daily activities  . No bowel movement for 3 days or you feel uncomfortable  . Extreme weakness that interferes with normal activities  . Bad abdominal pain, especially in upper right area  . Decreased urine  . Unusual thirst or passing urine often  . Rash that is not relieved by prescribed medicines  . Flu-like symptoms: fever, headache,  muscle and joint aches, and fatigue (low energy, feeling weak)  . Signs of liver problems: dark urine, pale bowel movements, bad stomach pain, feeling very tired and weak, unusual itching, or yellowing of the eyes or skin  . Signs of infusion reactions such as fever or shaking chills, flushing, facial swelling, feeling dizzy, headache, trouble breathing, rash, itching, chest tightness, or chest pain.   Reproduction Warnings  . Pregnancy warning: This drug may have harmful effects on the unborn baby. Women of childbearing potential should use effective methods of birth control during your cancer treatment and for at least 4 months after treatment. Let your doctor know right away if you think you may be pregnant  . Breast feeding warning: It is not known if this drug passes into breast milk. It is recommended that women do not breastfeed during treatment and for 4 months after treatment.  . Fertility warning: Human fertility studies have not been done with this drug. Talk with your doctor or nurse if you plan to have children.   SELF CARE ACTIVITIES WHILE RECEIVING CHEMOTHERAPY:  Hydration Increase your fluid intake 48 hours prior to treatment and drink at least 8 to 12 cups (64 ounces) of water/decaffeinated beverages per day after treatment. You can still have your cup of coffee or soda but these beverages do not count as part of your 8 to 12 cups that you need to drink daily. No alcohol intake.  Medications Continue taking your normal prescription medication as prescribed.  If you start any new herbal or new supplements please let us know first to make sure it is safe.  Mouth Care Have teeth cleaned professionally before starting treatment. Keep dentures and partial plates clean. Use soft toothbrush and do not use mouthwashes that contain alcohol. Biotene is a good mouthwash that is available at most pharmacies or may be ordered by calling [phone]. Use warm salt water gargles (1  teaspoon salt per 1 quart warm water) before and after meals and at bedtime. If you need dental work, please let the doctor know before you go for your appointment so that we can coordinate the best possible time for you in regards to your chemo regimen. You need to also let your dentist know that you are actively taking chemo. We may need to do labs prior to your dental appointment.  Skin Care Always use sunscreen that has not expired and with SPF (Sun Protection Factor) of 50 or higher. Wear hats to protect your head from the sun. Remember to use sunscreen on your hands, ears, face, & feet.  Use good moisturizing lotions such as udder cream, eucerin, or even Vaseline. Some chemotherapies can cause dry skin, color changes in your skin and nails.    . Avoid long, hot showers or baths. . Use gentle, fragrance-free soaps and laundry detergent. Marland Kitchen  Use moisturizers, preferably creams or ointments rather than lotions because the thicker consistency is better at preventing skin dehydration. Apply the cream or ointment within 15 minutes of showering. Reapply moisturizer at night, and moisturize your hands every time after you wash them.  Hair Loss (if your doctor says your hair will fall out)  . If your doctor says that your hair is likely to fall out, decide before you begin chemo whether you want to wear a wig. You may want to shop before treatment to match your hair color. . Hats, turbans, and scarves can also camouflage hair loss, although some people prefer to leave their heads uncovered. If you go bare-headed outdoors, be sure to use sunscreen on your scalp. . Cut your hair short. It eases the inconvenience of shedding lots of hair, but it also can reduce the emotional impact of watching your hair fall out. . Don't perm or color your hair during chemotherapy. Those chemical treatments are already damaging to hair and can enhance hair loss. Once your chemo treatments are done and your hair has grown back,  it's OK to resume dyeing or perming hair.  With chemotherapy, hair loss is almost always temporary. But when it grows back, it may be a different color or texture. In older adults who still had hair color before chemotherapy, the new growth may be completely gray.  Often, new hair is very fine and soft.  Infection Prevention Please wash your hands for at least 30 seconds using warm soapy water. Handwashing is the #1 way to prevent the spread of germs. Stay away from sick people or people who are getting over a cold. If you develop respiratory systems such as green/yellow mucus production or productive cough or persistent cough let us know and we will see if you need an antibiotic. It is a good idea to keep a pair of gloves on when going into grocery stores/[employer] to decrease your risk of coming into contact with germs on the carts, etc. Carry alcohol hand gel with you at all times and use it frequently if out in public. If your temperature reaches 100.5 or higher please call the clinic and let us know.  If it is after hours or on the weekend please go to the ER if your temperature is over 100.5.  Please have your own personal thermometer at home to use.    Sex and bodily fluids If you are going to have sex, a condom must be used to protect the person that isn't taking chemotherapy. Chemo can decrease your libido (sex drive). For a few days after chemotherapy, chemotherapy can be excreted through your bodily fluids.  When using the toilet please close the lid and flush the toilet twice.  Do this for a few day after you have had chemotherapy.   Effects of chemotherapy on your sex life Some changes are simple and won't last long. They won't affect your sex life permanently.  Sometimes you may feel: . too tired . not strong enough to be very active . sick or sore  . not in the mood . anxious or low  Your anxiety might not seem related to sex. For example, you may be worried about the cancer and how  your treatment is going. Or you may be worried about money, or about how you family are coping with your illness.  These things can cause stress, which can affect your interest in sex. It's important to talk to your partner about how you  feel. Remember - the changes to your sex life don't usually last long. There's usually no medical reason to stop having sex during chemo. The drugs won't have any long term physical effects on your performance or enjoyment of sex. Cancer can't be passed on to your partner during sex  Contraception It's important to use reliable contraception during treatment. Avoid getting pregnant while you or your partner are having chemotherapy. This is because the drugs may harm the baby. Sometimes chemotherapy drugs can leave a man or woman infertile.  This means you would not be able to have children in the future. You might want to talk to someone about permanent infertility. It can be very difficult to learn that you may no longer be able to have children. Some people find counselling helpful.  There might be ways to preserve your fertility, although this is easier for men than for women. You may want to speak to a fertility expert. You can talk about sperm banking or harvesting your eggs. You can also ask about other fertility options, such as donor eggs.  If you have or have had breast cancer, your doctor might advise you not to take the contraceptive pill. This is because the hormones in it might affect the cancer.  It is not known for sure whether or not chemotherapy drugs can be passed on through semen or secretions from the vagina. Because of this some doctors advise people to use a barrier method if you have sex during treatment. This applies to vaginal, anal or oral sex.  Generally, doctors advise a barrier method only for the time you are actually having the treatment and for about a week after your treatment.  Advice like this can be worrying, but this does not mean that you  have to avoid being intimate with your partner. You can still have close contact with your partner and continue to enjoy sex.  Animals If you have cats or birds we just ask that you not change the litter or change the cage.  Please have someone else do this for you while you are on chemotherapy.   Food Safety During and After Cancer Treatment Food safety is important for people both during and after cancer treatment. Cancer and cancer treatments, such as chemotherapy, radiation therapy, and stem cell/bone marrow transplantation, often weaken the immune system. This makes it harder for your body to protect itself from foodborne illness, also called food poisoning. Foodborne illness is caused by eating food that contains harmful bacteria, parasites, or viruses.  Foods to avoid Some foods have a higher risk of becoming tainted with bacteria. These include: Marland Kitchen Unwashed fresh fruit and vegetables, especially leafy vegetables that can hide dirt and other contaminants . Raw sprouts, such as alfalfa sprouts . Raw or undercooked beef, especially ground beef, or other raw or undercooked meat and poultry . Fatty, fried, or spicy foods immediately before or after treatment.  These can sit heavy on your stomach and make you feel nauseous. . Raw or undercooked shellfish, such as oysters. . Sushi and sashimi, which often contain raw fish.  . Unpasteurized beverages, such as unpasteurized fruit juices, raw milk, raw yogurt, or cider . Undercooked eggs, such as soft boiled, over easy, and poached; raw, unpasteurized eggs; or foods made with raw egg, such as homemade raw cookie dough and homemade mayonnaise  Simple steps for food safety  Shop smart. . Do not buy food stored or displayed in an unclean area. . Do not buy bruised  or damaged fruits or vegetables. . Do not buy cans that have cracks, dents, or bulges. . Pick up foods that can spoil at the end of your shopping trip and store them in a cooler on the  way home.  Prepare and clean up foods carefully. . Rinse all fresh fruits and vegetables under running water, and dry them with a clean towel or paper towel. . Clean the top of cans before opening them. . After preparing food, wash your hands for 20 seconds with hot water and soap. Pay special attention to areas between fingers and under nails. . Clean your utensils and dishes with hot water and soap. Marland Kitchen Disinfect your kitchen and cutting boards using 1 teaspoon of liquid, unscented bleach mixed into 1 quart of water.    Dispose of old food. . Eat canned and packaged food before its expiration date (the "use by" or "best before" date). . Consume refrigerated leftovers within 3 to 4 days. After that time, throw out the food. Even if the food does not smell or look spoiled, it still may be unsafe. Some bacteria, such as Listeria, can grow even on foods stored in the refrigerator if they are kept for too long.  Take precautions when eating out. . At restaurants, avoid buffets and salad bars where food sits out for a long time and comes in contact with many people. Food can become contaminated when someone with a virus, often a norovirus, or another "bug" handles it. . Put any leftover food in a "to-go" container yourself, rather than having the server do it. And, refrigerate leftovers as soon as you get home. . Choose restaurants that are clean and that are willing to prepare your food as you order it cooked.   AT HOME MEDICATIONS:                                                                                                                                                                Compazine/Prochlorperazine 10mg  tablet. Take 1 tablet every 6 hours as needed for nausea/vomiting. (This can make you sleepy)   EMLA cream. Apply a quarter size amount to port site 1 hour prior to chemo. Do not rub in. Cover with plastic wrap.    Diarrhea Sheet   If you are having loose stools/diarrhea,  please purchase Imodium and begin taking as outlined:  At the first sign of poorly formed or loose stools you should begin taking Imodium (loperamide) 2 mg capsules.  Take two tablets (4mg ) followed by one tablet (2mg ) every 2 hours - DO NOT EXCEED 8 tablets in 24 hours.  If it is bedtime and you are having loose stools, take 2 tablets at bedtime, then 2 tablets every 4 hours until morning.   Always call the  [facility] if you are having loose stools/diarrhea that you can't get under control.  Loose stools/diarrhea leads to dehydration (loss of water) in your body.  We have other options of trying to get the loose stools/diarrhea to stop but you must let us know!   Constipation Sheet  Colace - 100 mg capsules - take 2 capsules daily.  If this doesn't help then you can increase to 2 capsules twice daily.  Please call if the above does not work for you. Do not go more than 2 days without a bowel movement.  It is very important that you do not become constipated.  It will make you feel sick to your stomach (nausea) and can cause abdominal pain and vomiting.  Nausea Sheet   Compazine/Prochlorperazine 10mg  tablet. Take 1 tablet every 6 hours as needed for nausea/vomiting (This can make you drowsy).  If you are having persistent nausea (nausea that does not stop) please call the [facility] and let us know the amount of nausea that you are experiencing.  If you begin to vomit, you need to call the [facility] and if it is the weekend and you have vomited more than one time and can't get it to stop, go to the Emergency Room.  Persistent nausea/vomiting can lead to dehydration (loss of fluid in your body) and will make you feel very weak and unwell. Ice chips, sips of clear liquids, foods that are at room temperature, crackers, and toast tend to be better tolerated.   SYMPTOMS TO REPORT AS SOON AS POSSIBLE AFTER TREATMENT:  FEVER GREATER THAN 100.4 F  CHILLS WITH OR WITHOUT FEVER  NAUSEA AND  VOMITING THAT IS NOT CONTROLLED WITH YOUR NAUSEA MEDICATION  UNUSUAL SHORTNESS OF BREATH  UNUSUAL BRUISING OR BLEEDING  TENDERNESS IN MOUTH AND THROAT WITH OR WITHOUT   PRESENCE OF ULCERS  URINARY PROBLEMS  BOWEL PROBLEMS  UNUSUAL RASH    Wear comfortable clothing and clothing appropriate for easy access to any Portacath or PICC line. Let us know if there is anything that we can do to make your therapy better!   What to do if you need assistance after hours or on the weekends: CALL [phone].  HOLD on the line, do not hang up.  You will hear multiple messages but at the end you will be connected with a nurse triage line.  They will contact the doctor if necessary.  Most of the time they will be able to assist you.  Do not call the hospital operator.     I have been informed and understand all of the instructions given to me and have received a copy. I have been instructed to call the clinic [phone] or my family physician as soon as possible for continued medical care, if indicated. I do not have any more questions at this time but understand that I may call the [facility] or the Patient Navigator at [phone] during office hours should I have questions or need assistance in obtaining follow-up care.

## 2020-01-10 NOTE — Patient Instructions (Signed)

## 2020-01-10 NOTE — Progress Notes (Signed)
Chemotherapy/immunotherapy education packet given and discussed with pt and family in detail.  Discussed diagnosis and staging, tx regimen, and intent of tx.  Reviewed chemotherapy/immunotherapy medications and side effects, as well as pre-medications.  Instructed on how to manage side effects at home, and when to call the clinic.  Importance of fever/chills discussed with pt and family. Discussed precautions to implement at home after receiving tx, as well as self care strategies. Phone numbers provided for clinic during regular working hours, also how to reach the clinic after hours and on weekends. Pt and family provided the opportunity to ask questions - all questions answered to pt's and family satisfaction.

## 2020-01-10 NOTE — Progress Notes (Signed)
Lance Stafford; [id number]; 10-Feb-1946   HPI Patient is a 74 year old white male who was referred to my care by Dr. Delton Coombes for Port-A-Cath placement.  Patient has stage IV right lung carcinoma and is about to undergo chemotherapy. Past Medical History:  Diagnosis Date  . Arthritis   . Chronic low back pain    truck driver  . GERD (gastroesophageal reflux disease)   . Hyperlipidemia   . Nocturia   . Port-A-Cath in place 01/10/2020  . Prostate cancer ([place]) UROLOGIST-  DR WRENN/  ONCOLOGIST-  DR MANNING   dx 02/ 2017via [facility]---  Stage T1c,  Gleason 3+3,  PSA 11.9  . Wears glasses   . Wears partial dentures    upper and lower    Past Surgical History:  Procedure Laterality Date  . CATARACT EXTRACTION W/ INTRAOCULAR LENS  IMPLANT, BILATERAL  2015  . CHOLECYSTECTOMY    . CYSTOSCOPY  09/30/2016   Procedure: CYSTOSCOPY;  Surgeon: Irine Seal, MD;  Location: [facility];  Service: Urology;;  no seeds found in bladder  . RADIOACTIVE SEED IMPLANT N/A 09/30/2016   Procedure: RADIOACTIVE SEED IMPLANT/BRACHYTHERAPY IMPLANT, SPACE OAR;  Surgeon: Irine Seal, MD;  Location: [facility];  Service: Urology;  Laterality: N/A;  70 seeds implanted  . SPERMATOCELECTOMY Left 01/30/2015   Procedure: SPERMATOCELECTOMY;  Surgeon: Irine Seal, MD;  Location: [facility];  Service: Urology;  Laterality: Left;    Family History  Problem Relation Age of Onset  . Diabetes Mother   . Stroke Mother   . Cancer Father        throat  . Cancer Sister        skin  . Cancer Brother        prostate  . Cancer Paternal Grandmother   . Clotting disorder Daughter     Current Outpatient Medications on File Prior to Visit  Medication Sig Dispense Refill  . aspirin EC 81 MG tablet Take 81 mg by mouth daily.     Marland Kitchen atorvastatin (LIPITOR) 40 MG tablet Take 40 mg by mouth daily.    Mariane Baumgarten Calcium (STOOL SOFTENER PO) Take 2 tablets by mouth daily.    . folic acid  (FOLVITE) 1 MG tablet Take 1 tablet (1 mg total) by mouth daily. 90 tablet 1  . Ibuprofen (ADVIL PO) Take 1 tablet by mouth as needed.    . nabumetone (RELAFEN) 500 MG tablet Take 500 mg by mouth 2 (two) times daily.    Marland Kitchen omeprazole (PRILOSEC) 40 MG capsule Take 40 mg by mouth daily.    Derrill Memo ON 01/17/2020] CARBOPLATIN IV Inject into the vein every 21 ( twenty-one) days.    Marland Kitchen lidocaine-prilocaine (EMLA) cream Apply a small amount to port a cath site and cover with plastic wrap 1 hour prior to chemotherapy appointments 30 g 3  . PEMEtrexed 500 mg/m2 in sodium chloride 0.9 % 100 mL Inject 500 mg/m2 into the vein every 21 ( twenty-one) days.    . prochlorperazine (COMPAZINE) 10 MG tablet Take 1 tablet (10 mg total) by mouth every 6 (six) hours as needed (Nausea or vomiting). 30 tablet 1  . [START ON 01/17/2020] sodium chloride 0.9 % SOLN 50 mL with pembrolizumab 100 MG/4ML SOLN 2 mg/kg Inject 200 mg into the vein every 21 ( twenty-one) days.     No current facility-administered medications on file prior to visit.    No Known Allergies  Social History   Substance and Sexual Activity  Alcohol Use No    Social History   Tobacco Use  Smoking Status Former Smoker  . Packs/day: 2.00  . Years: 33.00  . Pack years: 66.00  . Types: Cigarettes  . Quit date: 01/23/1991  . Years since quitting: 28.9  Smokeless Tobacco Never Used    Review of Systems  Constitutional: Positive for malaise/fatigue.  HENT: Negative.   Respiratory: Positive for shortness of breath.   Cardiovascular: Positive for chest pain.  Gastrointestinal: Positive for heartburn.  Genitourinary: Positive for frequency.  Musculoskeletal: Positive for back pain, joint pain and neck pain.  Skin: Negative.   Neurological: Negative.   Endo/Heme/Allergies: Negative.   Psychiatric/Behavioral: Negative.     Objective   Vitals:   01/10/20 0854  BP: 122/78  Pulse: 75  Resp: 16  Temp: 98.3 F (36.8 C)  SpO2: 97%     Physical Exam Vitals reviewed.  Constitutional:      Appearance: Normal appearance. He is normal weight. He is not ill-appearing.  HENT:     Head: Normocephalic and atraumatic.  Cardiovascular:     Rate and Rhythm: Normal rate and regular rhythm.     Heart sounds: Normal heart sounds. No murmur heard.  No friction rub. No gallop.   Pulmonary:     Effort: Pulmonary effort is normal. No respiratory distress.     Breath sounds: Normal breath sounds. No stridor. No wheezing, rhonchi or rales.  Skin:    General: Skin is warm and dry.  Neurological:     Mental Status: He is alert and oriented to person, place, and time.    Dr. Tomie China notes reviewed Assessment  Stage IV right lung carcinoma, need for central venous access Plan   Patient is scheduled for Port-A-Cath insertion on 01/14/2020.  The risks and benefits of the procedure including bleeding, infection, and pneumothorax were fully explained to the patient, who gave informed consent.

## 2020-01-11 ENCOUNTER — Ambulatory Visit: Payer: Medicare Other | Admitting: Urology

## 2020-01-11 ENCOUNTER — Other Ambulatory Visit: Payer: Self-pay

## 2020-01-11 ENCOUNTER — Encounter (HOSPITAL_COMMUNITY)
Admission: RE | Admit: 2020-01-11 | Discharge: 2020-01-11 | Disposition: A | Discharge status: Home / Self Care | Payer: Medicare Other | Source: Ambulatory Visit | Attending: General Surgery | Admitting: General Surgery

## 2020-01-14 ENCOUNTER — Ambulatory Visit (HOSPITAL_COMMUNITY): Payer: Medicare Other

## 2020-01-14 ENCOUNTER — Other Ambulatory Visit (HOSPITAL_COMMUNITY): Payer: Self-pay | Admitting: *Deleted

## 2020-01-14 ENCOUNTER — Encounter (HOSPITAL_COMMUNITY)
Admission: RE | Disposition: A | Discharge status: Home / Self Care | Payer: Self-pay | Source: Home / Self Care | Attending: General Surgery

## 2020-01-14 ENCOUNTER — Ambulatory Visit (HOSPITAL_BASED_OUTPATIENT_CLINIC_OR_DEPARTMENT_OTHER): Payer: Medicare Other | Admitting: Hematology

## 2020-01-14 ENCOUNTER — Encounter (HOSPITAL_COMMUNITY): Payer: Self-pay | Admitting: Hematology

## 2020-01-14 ENCOUNTER — Telehealth (HOSPITAL_COMMUNITY): Payer: Self-pay

## 2020-01-14 ENCOUNTER — Ambulatory Visit (HOSPITAL_COMMUNITY): Payer: Medicare Other | Admitting: Anesthesiology

## 2020-01-14 ENCOUNTER — Encounter (HOSPITAL_COMMUNITY): Payer: Self-pay | Admitting: General Surgery

## 2020-01-14 ENCOUNTER — Ambulatory Visit (HOSPITAL_COMMUNITY)
Admission: RE | Admit: 2020-01-14 | Discharge: 2020-01-14 | Disposition: A | Discharge status: Home / Self Care | Payer: Medicare Other | Attending: General Surgery | Admitting: General Surgery

## 2020-01-14 VITALS — BP 146/81 | HR 68 | Temp 97.1°F | Resp 18 | Wt 213.2 lb

## 2020-01-14 DIAGNOSIS — Z808 Family history of malignant neoplasm of other organs or systems: Secondary | ICD-10-CM | POA: Insufficient documentation

## 2020-01-14 DIAGNOSIS — K219 Gastro-esophageal reflux disease without esophagitis: Secondary | ICD-10-CM | POA: Insufficient documentation

## 2020-01-14 DIAGNOSIS — Z87891 Personal history of nicotine dependence: Secondary | ICD-10-CM | POA: Insufficient documentation

## 2020-01-14 DIAGNOSIS — Z79899 Other long term (current) drug therapy: Secondary | ICD-10-CM | POA: Insufficient documentation

## 2020-01-14 DIAGNOSIS — Z452 Encounter for adjustment and management of vascular access device: Secondary | ICD-10-CM

## 2020-01-14 DIAGNOSIS — Z7982 Long term (current) use of aspirin: Secondary | ICD-10-CM | POA: Diagnosis not present

## 2020-01-14 DIAGNOSIS — C3490 Malignant neoplasm of unspecified part of unspecified bronchus or lung: Secondary | ICD-10-CM

## 2020-01-14 DIAGNOSIS — Z95828 Presence of other vascular implants and grafts: Secondary | ICD-10-CM

## 2020-01-14 DIAGNOSIS — E785 Hyperlipidemia, unspecified: Secondary | ICD-10-CM | POA: Diagnosis not present

## 2020-01-14 DIAGNOSIS — Z8042 Family history of malignant neoplasm of prostate: Secondary | ICD-10-CM | POA: Insufficient documentation

## 2020-01-14 DIAGNOSIS — Z801 Family history of malignant neoplasm of trachea, bronchus and lung: Secondary | ICD-10-CM | POA: Insufficient documentation

## 2020-01-14 DIAGNOSIS — C3491 Malignant neoplasm of unspecified part of right bronchus or lung: Secondary | ICD-10-CM | POA: Insufficient documentation

## 2020-01-14 DIAGNOSIS — M1990 Unspecified osteoarthritis, unspecified site: Secondary | ICD-10-CM | POA: Diagnosis not present

## 2020-01-14 DIAGNOSIS — Z809 Family history of malignant neoplasm, unspecified: Secondary | ICD-10-CM | POA: Diagnosis not present

## 2020-01-14 DIAGNOSIS — Z8546 Personal history of malignant neoplasm of prostate: Secondary | ICD-10-CM | POA: Diagnosis not present

## 2020-01-14 HISTORY — PX: PORTACATH PLACEMENT: SHX2246

## 2020-01-14 SURGERY — INSERTION, TUNNELED CENTRAL VENOUS DEVICE, WITH PORT
Anesthesia: General | Site: Chest | Laterality: Left

## 2020-01-14 MED ORDER — OXYCODONE HCL 5 MG PO TABS: 10.0000 mg | ORAL_TABLET | Freq: Three times a day (TID) | ORAL | 0 refills | Status: DC | PRN

## 2020-01-14 MED ORDER — CEFAZOLIN SODIUM-DEXTROSE 2-4 GM/100ML-% IV SOLN
2.0000 g | INTRAVENOUS | Status: AC
  Administered 2020-01-14: 2 g via INTRAVENOUS
  Filled 2020-01-14: qty 100

## 2020-01-14 MED ORDER — LIDOCAINE HCL (PF) 1 % IJ SOLN
INTRAMUSCULAR | Status: DC | PRN
  Administered 2020-01-14: 8 mL

## 2020-01-14 MED ORDER — HEPARIN SOD (PORK) LOCK FLUSH 100 UNIT/ML IV SOLN
INTRAVENOUS | Status: AC
  Filled 2020-01-14: qty 5

## 2020-01-14 MED ORDER — PROPOFOL 10 MG/ML IV BOLUS
INTRAVENOUS | Status: AC
  Filled 2020-01-14: qty 40

## 2020-01-14 MED ORDER — CHLORHEXIDINE GLUCONATE CLOTH 2 % EX PADS: 6.0000 | MEDICATED_PAD | Freq: Once | CUTANEOUS | Status: DC

## 2020-01-14 MED ORDER — PROPOFOL 10 MG/ML IV BOLUS
INTRAVENOUS | Status: DC | PRN
  Administered 2020-01-14 (×2): 40 mg via INTRAVENOUS

## 2020-01-14 MED ORDER — LIDOCAINE HCL (PF) 1 % IJ SOLN
INTRAMUSCULAR | Status: AC
  Filled 2020-01-14: qty 30

## 2020-01-14 MED ORDER — KETOROLAC TROMETHAMINE 30 MG/ML IJ SOLN
INTRAMUSCULAR | Status: DC | PRN
  Administered 2020-01-14: 15 mg via INTRAVENOUS

## 2020-01-14 MED ORDER — FENTANYL CITRATE (PF) 100 MCG/2ML IJ SOLN: 25.0000 ug | INTRAMUSCULAR | Status: DC | PRN

## 2020-01-14 MED ORDER — FENTANYL CITRATE (PF) 100 MCG/2ML IJ SOLN
INTRAMUSCULAR | Status: DC | PRN
  Administered 2020-01-14 (×2): 50 ug via INTRAVENOUS

## 2020-01-14 MED ORDER — KETOROLAC TROMETHAMINE 30 MG/ML IJ SOLN
INTRAMUSCULAR | Status: AC
  Filled 2020-01-14: qty 1

## 2020-01-14 MED ORDER — SODIUM CHLORIDE (PF) 0.9 % IJ SOLN
INTRAMUSCULAR | Status: DC | PRN
  Administered 2020-01-14: 50 mL via INTRAVENOUS

## 2020-01-14 MED ORDER — CHLORHEXIDINE GLUCONATE 0.12 % MT SOLN
15.0000 mL | Freq: Once | OROMUCOSAL | Status: AC
  Administered 2020-01-14: 15 mL via OROMUCOSAL

## 2020-01-14 MED ORDER — ORAL CARE MOUTH RINSE: 15.0000 mL | Freq: Once | OROMUCOSAL | Status: AC

## 2020-01-14 MED ORDER — MIDAZOLAM HCL 2 MG/2ML IJ SOLN
INTRAMUSCULAR | Status: AC
  Filled 2020-01-14: qty 2

## 2020-01-14 MED ORDER — HEPARIN SOD (PORK) LOCK FLUSH 100 UNIT/ML IV SOLN
INTRAVENOUS | Status: DC | PRN
  Administered 2020-01-14: 500 [IU] via INTRAVENOUS

## 2020-01-14 MED ORDER — CHLORHEXIDINE GLUCONATE 0.12 % MT SOLN
OROMUCOSAL | Status: AC
  Filled 2020-01-14: qty 15

## 2020-01-14 MED ORDER — MIDAZOLAM HCL 5 MG/5ML IJ SOLN
INTRAMUSCULAR | Status: DC | PRN
  Administered 2020-01-14 (×2): 1 mg via INTRAVENOUS

## 2020-01-14 MED ORDER — LACTATED RINGERS IV SOLN
INTRAVENOUS | Status: DC
  Administered 2020-01-14: 1000 mL via INTRAVENOUS

## 2020-01-14 MED ORDER — ONDANSETRON HCL 4 MG/2ML IJ SOLN: 4.0000 mg | Freq: Once | INTRAMUSCULAR | Status: DC | PRN

## 2020-01-14 MED ORDER — PROPOFOL 500 MG/50ML IV EMUL
INTRAVENOUS | Status: DC | PRN
  Administered 2020-01-14: 75 ug/kg/min via INTRAVENOUS

## 2020-01-14 MED ORDER — FENTANYL CITRATE (PF) 100 MCG/2ML IJ SOLN
INTRAMUSCULAR | Status: AC
  Filled 2020-01-14: qty 2

## 2020-01-14 SURGICAL SUPPLY — 32 items
ADH SKN CLS APL DERMABOND .7 (GAUZE/BANDAGES/DRESSINGS) ×1
APL PRP STRL LF ISPRP CHG 10.5 (MISCELLANEOUS) ×1
APPLICATOR CHLORAPREP 10.5 ORG (MISCELLANEOUS) ×3 IMPLANT
BAG DECANTER FOR FLEXI CONT (MISCELLANEOUS) ×3 IMPLANT
CLOTH BEACON ORANGE TIMEOUT ST (SAFETY) ×3 IMPLANT
COVER LIGHT HANDLE STERIS (MISCELLANEOUS) ×6 IMPLANT
COVER WAND RF STERILE (DRAPES) ×3 IMPLANT
DECANTER SPIKE VIAL GLASS SM (MISCELLANEOUS) ×3 IMPLANT
DERMABOND ADVANCED (GAUZE/BANDAGES/DRESSINGS) ×2
DERMABOND ADVANCED .7 DNX12 (GAUZE/BANDAGES/DRESSINGS) ×1 IMPLANT
DRAPE C-ARM FOLDED MOBILE STRL (DRAPES) ×3 IMPLANT
ELECT REM PT RETURN 9FT ADLT (ELECTROSURGICAL) ×3
ELECTRODE REM PT RTRN 9FT ADLT (ELECTROSURGICAL) ×1 IMPLANT
GLOVE BIO SURGEON STRL SZ7 (GLOVE) ×3 IMPLANT
GLOVE BIOGEL PI IND STRL 7.0 (GLOVE) ×3 IMPLANT
GLOVE BIOGEL PI INDICATOR 7.0 (GLOVE) ×6
GLOVE ECLIPSE 6.5 STRL STRAW (GLOVE) ×3 IMPLANT
GLOVE SURG SS PI 7.5 STRL IVOR (GLOVE) ×3 IMPLANT
GOWN STRL REUS W/TWL LRG LVL3 (GOWN DISPOSABLE) ×9 IMPLANT
IV NS 500ML (IV SOLUTION) ×3
IV NS 500ML BAXH (IV SOLUTION) ×1 IMPLANT
KIT PORT POWER 8FR ISP MRI (Port) ×3 IMPLANT
KIT TURNOVER KIT A (KITS) ×3 IMPLANT
NEEDLE HYPO 25X1 1.5 SAFETY (NEEDLE) ×3 IMPLANT
PACK MINOR (CUSTOM PROCEDURE TRAY) ×3 IMPLANT
PAD ARMBOARD 7.5X6 YLW CONV (MISCELLANEOUS) ×3 IMPLANT
SET BASIN LINEN APH (SET/KITS/TRAYS/PACK) ×3 IMPLANT
SUT MNCRL AB 4-0 PS2 18 (SUTURE) ×3 IMPLANT
SUT VIC AB 3-0 SH 27 (SUTURE) ×3
SUT VIC AB 3-0 SH 27X BRD (SUTURE) ×1 IMPLANT
SYR 5ML LL (SYRINGE) ×3 IMPLANT
SYR CONTROL 10ML LL (SYRINGE) ×3 IMPLANT

## 2020-01-14 NOTE — Interval H&P Note (Signed)
History and Physical Interval Note:  01/14/2020 7:13 AM  Lance Stafford  has presented today for surgery, with the diagnosis of Right lung cancer.  The various methods of treatment have been discussed with the patient and family. After consideration of risks, benefits and other options for treatment, the patient has consented to  Procedure(s): INSERTION PORT-A-CATH (Left) as a surgical intervention.  The patient's history has been reviewed, patient examined, no change in status, stable for surgery.  I have reviewed the patient's chart and labs.  Questions were answered to the patient's satisfaction.     Aviva Signs

## 2020-01-14 NOTE — Telephone Encounter (Signed)
53 Pt's wife came up to clinic while her husband is having a poracath placed this morning downstairs. She reports that Mr Gramling, who will start chemotherapy Thursday, has had severe neck, back and abdominal pain for the last 36 hours unrelieved with Hydrocodone 5/325 mg every 8 hours. He has no appetite,has lost 7 pounds since his last MD appt and is very weak. He has also been constipated for 2 days and has taken 3 stool softeners with no relief. He is passing gas but still feels bloated.This information was reported to Dr. Delton Coombes and pt to come to clinic after portacath placement completed for further assessment per MD. Pt's wife notified and verbalized understanding

## 2020-01-14 NOTE — Progress Notes (Signed)
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25 mg, Intravenous,  Once, 0 of 4 cycles PEMEtrexed (ALIMTA) 1,100 mg in sodium chloride 0.9 % 100 mL chemo infusion, 500 mg/m2, Intravenous,  Once, 0 of 6 cycles CARBOplatin (PARAPLATIN) in sodium chloride 0.9 % 100 mL chemo infusion, , Intravenous,  Once, 0 of 4 cycles fosaprepitant (EMEND) 150 mg in sodium chloride 0.9 % 145 mL IVPB, 150 mg, Intravenous,  Once, 0 of 4 cycles pembrolizumab (KEYTRUDA) 200 mg in sodium chloride 0.9 % 50 mL chemo infusion, 200 mg, Intravenous, Once, 0 of 6 cycles  for chemotherapy treatment.      CANCER STAGING: Cancer Staging Malignant neoplasm of right lung [facility]) Staging form: Lung, AJCC 8th Edition - Clinical: Stage IVB (cT1b, cN2, pM1c) - Signed by [name], MD on 12/20/2019   INTERVAL HISTORY:  [name], a 74 y.o. male, returns for routine follow-up of his stage IV right lung adenocarcinoma. Juliano was last seen on 01/03/2020.  Today he is accompanied by his wife. He reports that he started having pain on 10/1 from his lower abdomen all the way up to his  right shoulder and the Norco is helping only for 3-4 hours. The worst pain he has is in his right shoulder radiating to his left chest and across his lower abdomen. Every time he eats or drinks Ensure, he has pain in his upper and lower chest. The pain worsened on 10/2 and 10/3 to the point that he could not stand. He takes 2 to 3 tablets of stool softener at night but is still constipated. He also has weakness in his legs today since he has not been eating well at all.  He is going to see radiation on 10/12.   REVIEW OF SYSTEMS:  Review of Systems  Constitutional: Positive for appetite change (25%) and fatigue (depleted).  Respiratory: Positive for shortness of breath.   Cardiovascular: Positive for chest pain (upper and lower CP after eating).  Gastrointestinal: Positive for constipation.  Musculoskeletal: Positive for arthralgias (5/10 R shoulder pain).  Neurological: Positive for dizziness, headaches and numbness (& pain in R shoulder to hand).  Psychiatric/Behavioral: Positive for depression. The patient is nervous/anxious.   All other systems reviewed and are negative.   PAST MEDICAL/SURGICAL HISTORY:  Past Medical History:  Diagnosis Date  . Arthritis   . Chronic low back pain    truck driver  . GERD (gastroesophageal reflux disease)   . Hyperlipidemia   . Nocturia   . Port-A-Cath in place 01/10/2020  . Prostate cancer ([place]) UROLOGIST-  DR WRENN/  ONCOLOGIST-  DR Tammi Klippel  dx 02/ 2017via TRUSPbx---  Stage T1c,  Gleason 3+3,  PSA 11.9  . Wears glasses   . Wears partial dentures    upper and lower   Past Surgical History:  Procedure Laterality Date  . CATARACT EXTRACTION W/ INTRAOCULAR LENS  IMPLANT, BILATERAL  2015  . CHOLECYSTECTOMY    . CYSTOSCOPY  09/30/2016   Procedure: CYSTOSCOPY;  Surgeon: Irine Seal, MD;  Location: [facility];  Service: Urology;;  no seeds found in bladder  . RADIOACTIVE SEED IMPLANT N/A 09/30/2016   Procedure: RADIOACTIVE SEED  IMPLANT/BRACHYTHERAPY IMPLANT, SPACE OAR;  Surgeon: Irine Seal, MD;  Location: [facility];  Service: Urology;  Laterality: N/A;  70 seeds implanted  . SPERMATOCELECTOMY Left 01/30/2015   Procedure: SPERMATOCELECTOMY;  Surgeon: Irine Seal, MD;  Location[facility];  Service: Urology;  Laterality: Left;    SOCIAL HISTORY:  Social History   Socioeconomic History  . Marital status: Married    Spouse name: Not on file  . Number of children: 2  . Years of education: Not on file  . Highest education level: Not on file  Occupational History  . Occupation: truck Geophysicist/field seismologist  Tobacco Use  . Smoking status: Former Smoker    Packs/day: 2.00    Years: 33.00    Pack years: 66.00    Types: Cigarettes    Quit date: 01/23/1991    Years since quitting: 28.9  . Smokeless tobacco: Never Used  Substance and Sexual Activity  . Alcohol use: No  . Drug use: No  . Sexual activity: Yes  Other Topics Concern  . Not on file  Social History Narrative  . Not on file   Social Determinants of Health   Financial Resource Strain: Low Risk   . Difficulty of Paying Living Expenses: Not hard at all  Food Insecurity: No Food Insecurity  . Worried About Charity fundraiser in the Last Year: Never true  . Ran Out of Food in the Last Year: Never true  Transportation Needs: No Transportation Needs  . Lack of Transportation (Medical): No  . Lack of Transportation (Non-Medical): No  Physical Activity: Sufficiently Active  . Days of Exercise per Week: 1 day  . Minutes of Exercise per Session: 150+ min  Stress: No Stress Concern Present  . Feeling of Stress : Only a little  Social Connections: Socially Integrated  . Frequency of Communication with Friends and Family: More than three times a week  . Frequency of Social Gatherings with Friends and Family: Three times a week  . Attends Religious Services: More than 4 times per year  . Active Member of Clubs or Organizations: Yes  .  Attends Archivist Meetings: More than 4 times per year  . Marital Status: Married  Human resources officer Violence: Not At Risk  . Fear of Current or Ex-Partner: No  . Emotionally Abused: No  . Physically Abused: No  . Sexually Abused: No    FAMILY HISTORY:  Family History  Problem Relation Age of Onset  . Diabetes Mother   . Stroke Mother   . Cancer Father        throat  . Cancer Sister        skin  . Cancer Brother        prostate  . Cancer Paternal Grandmother   . Clotting disorder Daughter     CURRENT MEDICATIONS:  No current facility-administered medications for this visit.   No current outpatient medications on  file.   Facility-Administered Medications Ordered in Other Visits  Medication Dose Route Frequency Provider Last Rate Last Admin  . Chlorhexidine Gluconate Cloth 2 % PADS 6 each  6 each Topical Once Aviva Signs, MD       And  . Chlorhexidine Gluconate Cloth 2 % PADS 6 each  6 each Topical Once Aviva Signs, MD      . fentaNYL (SUBLIMAZE) injection 25-50 mcg  25-50 mcg Intravenous Q5 min PRN Louann Sjogren, MD      . heparin lock flush 100 unit/mL    PRN Aviva Signs, MD   500 Units at 01/14/20 [phone]  . lactated ringers infusion   Intravenous Continuous Louann Sjogren, MD   Stopped at 01/14/20 [phone]  . lidocaine (PF) (XYLOCAINE) 1 % injection    PRN Aviva Signs, MD   8 mL at 01/14/20 0743  . ondansetron (ZOFRAN) injection 4 mg  4 mg Intravenous Once PRN Louann Sjogren, MD      . sodium chloride (PF) 0.9 % injection    PRN Aviva Signs, MD   50 mL at 01/14/20 0752    ALLERGIES:  No Known Allergies  PHYSICAL EXAM:  Performance status (ECOG): 1 - Symptomatic but completely ambulatory  Vitals:   01/14/20 0920 01/14/20 0923  BP: (!) 146/81 (!) 146/81  Pulse: 68 68  Resp: 18 18  Temp: (!) 97.1 F (36.2 C) (!) 97.1 F (36.2 C)  SpO2: 93% 93%   Wt Readings from Last 3 Encounters:  01/14/20 213 lb 3.2 oz (96.7 kg)  01/10/20 220 lb (99.8 kg)    01/03/20 224 lb (101.6 kg)   Physical Exam Vitals reviewed.  Constitutional:      Appearance: Normal appearance.  Cardiovascular:     Rate and Rhythm: Normal rate and regular rhythm.     Pulses: Normal pulses.     Heart sounds: Normal heart sounds.  Pulmonary:     Effort: Pulmonary effort is normal.     Breath sounds: Normal breath sounds.  Chest:     Comments: Port-a-Cath in L chest Abdominal:     Palpations: Abdomen is soft.     Tenderness: There is abdominal tenderness in the right upper quadrant.  Neurological:     General: No focal deficit present.     Mental Status: He is alert and oriented to person, place, and time.  Psychiatric:        Mood and Affect: Mood normal.        Behavior: Behavior normal.      LABORATORY DATA:  I have reviewed the labs as listed.  CBC Latest Ref Rng & Units 12/20/2019 12/13/2019 09/23/2016  WBC 4.0 - 10.5 K/uL 6.0 5.3 5.4  Hemoglobin 13.0 - 17.0 g/dL 15.8 14.9 16.4  Hematocrit 39 - 52 % 47.3 43.7 47.4  Platelets 150 - 400 K/uL 282 224 200   CMP Latest Ref Rng & Units 12/20/2019 12/03/2019 09/23/2016  Glucose 70 - 99 mg/dL 124(H) - 110(H)  BUN 8 - 23 mg/dL 14 - 13  Creatinine 0.61 - 1.24 mg/dL 1.07 0.90 1.03  Sodium 135 - 145 mmol/L 137 - 140  Potassium 3.5 - 5.1 mmol/L 4.0 - 5.0  Chloride 98 - 111 mmol/L 97(L) - 103  CO2 22 - 32 mmol/L 26 - 28  Calcium 8.9 - 10.3 mg/dL 9.1 - 9.2  Total Protein 6.5 - 8.1 g/dL 7.8 - 7.2  Total Bilirubin 0.3 - 1.2 mg/dL 1.0 - 1.2  Alkaline Phos  38 - 126 U/L 107 - 70  AST 15 - 41 U/L 18 - 22  ALT 0 - 44 U/L 18 - 16(L)    DIAGNOSTIC IMAGING:   I have independently reviewed the scans and discussed with the patient. MR Brain W Wo Contrast  Result Date: 12/28/2019 CLINICAL DATA:  Non-small cell lung cancer, staging. EXAM: MRI HEAD WITHOUT AND WITH CONTRAST TECHNIQUE: Multiplanar, multiecho pulse sequences of the brain and surrounding structures were obtained without and with intravenous contrast. CONTRAST:   20m GADAVIST GADOBUTROL 1 MMOL/ML IV SOLN COMPARISON:  None. FINDINGS: Brain: Lesions include: - Intrinsically T1 hyperintense 7 mm right cerebellar lesion with minimal enhancement and peripheral DWI hyperintensity (18:29). - Intrinsically T1 hyperintense 6 mm inferior left cerebellar lesion (18:16) without definite enhancement. No acute infarct. No SWI signal abnormality. No midline shift, ventriculomegaly or extra-axial fluid collection. Vascular: Normal flow voids. Skull and upper cervical spine: Enhancing 1.2 cm left clival lesion (18:22). Sinuses/Orbits: Sequela of bilateral lens replacement. Clear paranasal sinuses and mastoid air cells. Other: None. 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 No metastases within the cerebral hemispheres. Enhancing 1.2 cm left clival metastasis. These results will be called to the ordering clinician or representative by the Radiologist Assistant, and communication documented in the PACS or [employer] Electronically Signed   By: CPrimitivo GauzeM.D.   On: 12/28/2019 08:52   DG Chest Port 1 View  Result Date: 01/14/2020 CLINICAL DATA:  Left chest port placement, history of prostate cancer EXAM: PORTABLE CHEST 1 VIEW COMPARISON:  CT chest dated 12/07/2019 FINDINGS: Known right upper lobe nodule is obscured by the right anterior 1st rib. Lungs are essentially clear.  No pleural effusion or pneumothorax. The heart is normal in size. Left chest power port terminates in the mid SVC. IMPRESSION: Left chest power port terminates in the mid SVC. Known right upper lobe nodule is obscured by the right anterior 1st rib. Electronically Signed   By: SJulian HyM.D.   On: 01/14/2020 08:23   DG C-Arm 1-60 Min-No Report  Result Date: 01/14/2020 Fluoroscopy was utilized by the requesting physician.  No radiographic interpretation.     ASSESSMENT:  1. 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 CJenetta Downer -CT abdomen on 12/03/2019 showed posterior right lobe lesion measuring 2.1 x 1.9 cm and another anterior liver dome lesion measuring 1.3 x 1.2 cm. Right adrenal mass measuring 3.3 x 1.8 cm. -MRI of the liver on 12/05/2019 showed rim-enhancing lesion of the right adrenal gland measuring 3.0 x 2.5 cm. There is rim-enhancing lesion of the superior pole of the left kidney measuring 1.9 x 1.7 cm. Right lobe of the liver lesion measuring 2.2 x 2.0 cm. Anterior liver dome lesion measuring 1.1 x 0.9 cm. -CT chest without contrast on 12/07/2019 showed 1.9 cm spiculated nodule in the right lung apex. Right paratracheal lymph node 1.3 cm. 1.5 cm lower right paratracheal node. -Right lobe of liver needle biopsy consistent with adenocarcinoma, positive for CK7, TTF-1. Negative for CK20, CDX2, Napsin a and CK 5/6. -PD-L1 TPS 1% -Foundation 1 with TMB high, MS-stable, no other targetable mutations.  2. Prostate cancer: -Diagnosed in 2017, seed implants done in June 2018.  3. Social/family history: -He drives a tTeacher, English as a foreign language Quit smoking in 1992, 2 packs/day for 32 years. -Brother had prostate cancer. Sister had skin cancer and mother had throat cancer.  4.  Brain metastasis: -MRI of the brain on  12/28/2019 shows bilateral cerebellar metastasis measuring 6 to 7 mm.  Enhancing 1.2 cm left clival metastasis.   PLAN:  1. 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 -He will start his chemotherapy on Thursday because of worsening of his pain.  2.  Brain metastasis: -He has 2 subcentimeter bilateral cerebellar metastasis and left clival bone met. -He was referred for [facility].  3.  Generalized pains: -He started having pain in the right shoulder, right upper quadrant, epigastric region and chest wall on Friday. -He has used hydrocodone 5/325 which did  not help. -He reports having shoulder pains for many years but have gotten worse. -I will start him on oxycodone 10 mg every 6-8 hours as needed. -He was told to take stool softeners.   Orders placed this encounter:  No orders of the defined types were placed in this encounter.    [name], MD [place] [phone]   I, Milinda Antis, am acting as a scribe for Dr. Sanda Linger.  I, [name] MD, have reviewed the above documentation for accuracy and completeness, and I agree with the above.

## 2020-01-14 NOTE — Anesthesia Postprocedure Evaluation (Signed)
Anesthesia Post Note  Patient: Lance Stafford  Procedure(s) Performed: INSERTION PORT-A-CATH (Left Chest)  Patient location during evaluation: Short Stay Anesthesia Type: General Level of consciousness: awake and alert and patient cooperative Pain management: satisfactory to patient Vital Signs Assessment: post-procedure vital signs reviewed and stable Respiratory status: spontaneous breathing Cardiovascular status: stable Postop Assessment: no apparent nausea or vomiting Anesthetic complications: no   No complications documented.   Last Vitals:  Vitals:   01/14/20 0835 01/14/20 0849  BP:  124/85  Pulse:  73  Resp: 17 16  Temp:  36.7 C  SpO2: 97% 93%    Last Pain:  Vitals:   01/14/20 0849  TempSrc: Oral  PainSc: 4                  Shelisa Fern

## 2020-01-14 NOTE — Patient Instructions (Signed)
[place] at [facility] Discharge Instructions  You were seen today by Dr. Delton Coombes. He went over your recent results. You will start your chemotherapy treatment this week. You will be prescribed oxycodone 10 mg to take every 8 hours as needed for pain. Purchase Miralax over the counter and take every day to relieve the constipation. Dr. Delton Coombes will see you back on first day of treatment for labs and follow up.   Thank you for choosing [place] at [facility] to provide your oncology and hematology care.  To afford each patient quality time with our provider, please arrive at least 15 minutes before your scheduled appointment time.   If you have a lab appointment with the [place] please come in thru the Main Entrance and check in at the main information desk  You need to re-schedule your appointment should you arrive 10 or more minutes late.  We strive to give you quality time with our providers, and arriving late affects you and other patients whose appointments are after yours.  Also, if you no show three or more times for appointments you may be dismissed from the clinic at the providers discretion.     Again, thank you for choosing [facility].  Our hope is that these requests will decrease the amount of time that you wait before being seen by our physicians.       _____________________________________________________________  Should you have questions after your visit to [facility], please contact our office at [phone] between the hours of 8:00 a.m. and 4:30 p.m.  Voicemails left after 4:00 p.m. will not be returned until the following business day.  For prescription refill requests, have your pharmacy contact our office and allow 72 hours.    Cancer Center Support Programs:   > Cancer Support Group  2nd Tuesday of the month 1pm-2pm, Journey Room

## 2020-01-14 NOTE — Transfer of Care (Signed)
Immediate Anesthesia Transfer of Care Note  Patient: Lance Stafford  Procedure(s) Performed: INSERTION PORT-A-CATH (Left Chest)  Patient Location: PACU  Anesthesia Type:General  Level of Consciousness: awake and patient cooperative  Airway & Oxygen Therapy: Patient Spontanous Breathing and Patient connected to nasal cannula oxygen  Post-op Assessment: Report given to RN and Post -op Vital signs reviewed and stable  Post vital signs: Reviewed and stable  Last Vitals:  Vitals Value Taken Time  BP    Temp 98.1   Pulse 77 01/14/20 0805  Resp 24 01/14/20 0805  SpO2 91 % 01/14/20 0805  Vitals shown include unvalidated device data.  Last Pain:  Vitals:   01/14/20 0646  TempSrc: Oral  PainSc: 10-Worst pain ever      Patients Stated Pain Goal: 6 (44/69/50 7225)  Complications: No complications documented.

## 2020-01-14 NOTE — Discharge Instructions (Signed)

## 2020-01-14 NOTE — Op Note (Signed)
Patient:  Lance Stafford  DOB:  [birth year]  MRN:  [id number]   Preop Diagnosis: Right lung carcinoma, need for central venous access  Postop Diagnosis: Same  Procedure: Port-A-Cath insertion  Surgeon: Aviva Signs, MD  Anes: MAC  Indications: Patient is a 74 year old white male who is about to undergo chemotherapy for right lung carcinoma.  The risks and benefits of the procedure including bleeding, infection, and pneumothorax were fully explained to the patient, who gave informed consent.  Procedure note: The patient was placed in the Trendelenburg position after the left upper chest was prepped and draped using the usual sterile technique with ChloraPrep.  Surgical site confirmation was performed.  1% Xylocaine was used for local anesthesia.  Incision was made below the left clavicle.  A subcutaneous pocket was formed.  A needle was advanced into the left subclavian vein using the Seldinger technique without difficulty.  A guidewire was then advanced into the right atrium under fluoroscopic guidance.  An introducer and peel-away sheath were placed over the guidewire.  The catheter was inserted through the peel-away sheath and the peel-away sheath was removed.  The catheter was then attached to the port and the port placed in subcutaneous pocket.  Adequate positioning was confirmed by fluoroscopy.  Good backflow of venous blood was noted on aspiration of the port.  The port was flushed with heparin flush.  The subcutaneous layer was reapproximated using a 3-0 Vicryl interrupted suture.  The skin was closed using a 4-0 Monocryl subcuticular suture.  Dermabond was applied.  All tape and needle counts were correct at the end of the procedure.  Patient was awakened and transferred to PACU in stable condition.  A chest x-ray will be performed at that time.  Complications: None  EBL: Minimal  Specimen: None

## 2020-01-14 NOTE — Anesthesia Preprocedure Evaluation (Signed)
Anesthesia Evaluation  Patient identified by MRN, date of birth, ID band Patient awake    Reviewed: Allergy & Precautions, H&P , NPO status , Patient's Chart, lab work & pertinent test results, reviewed documented beta blocker date and time   Airway Mallampati: II  TM Distance: >3 FB Neck ROM: full    Dental no notable dental hx. (+) Partial Upper   Pulmonary neg pulmonary ROS, former smoker,    Pulmonary exam normal breath sounds clear to auscultation       Cardiovascular Exercise Tolerance: Good negative cardio ROS   Rhythm:regular Rate:Normal     Neuro/Psych negative neurological ROS  negative psych ROS   GI/Hepatic Neg liver ROS, GERD  Medicated,  Endo/Other  negative endocrine ROS  Renal/GU negative Renal ROS  negative genitourinary   Musculoskeletal   Abdominal   Peds  Hematology negative hematology ROS (+)   Anesthesia Other Findings   Reproductive/Obstetrics negative OB ROS                             Anesthesia Physical Anesthesia Plan  ASA: III  Anesthesia Plan: General   Post-op Pain Management:    Induction:   PONV Risk Score and Plan: Propofol infusion  Airway Management Planned:   Additional Equipment:   Intra-op Plan:   Post-operative Plan:   Informed Consent: I have reviewed the patients History and Physical, chart, labs and discussed the procedure including the risks, benefits and alternatives for the proposed anesthesia with the patient or authorized representative who has indicated his/her understanding and acceptance.     Dental Advisory Given  Plan Discussed with: CRNA  Anesthesia Plan Comments:         Anesthesia Quick Evaluation

## 2020-01-14 NOTE — Anesthesia Procedure Notes (Signed)
Date/Time: 01/14/2020 7:26 AM Performed by: Vista Deck, CRNA Pre-anesthesia Checklist: Patient identified, Emergency Drugs available, Suction available, Timeout performed and Patient being monitored Patient Re-evaluated:Patient Re-evaluated prior to induction Oxygen Delivery Method: Nasal Cannula

## 2020-01-15 ENCOUNTER — Encounter (HOSPITAL_COMMUNITY): Payer: Self-pay | Admitting: General Surgery

## 2020-01-15 NOTE — Progress Notes (Signed)
.   Pharmacist Chemotherapy Monitoring - Initial Assessment    Anticipated start date: 01/17/20   Regimen:  . Are orders appropriate based on the patient's diagnosis, regimen, and cycle? Yes . Does the plan date match the patient's scheduled date? Yes . Is the sequencing of drugs appropriate? Yes . Are the premedications appropriate for the patient's regimen? Yes . Prior Authorization for treatment is: Approved o If applicable, is the correct biosimilar selected based on the patient's insurance? not applicable  Organ Function and Labs: [place] Kitchen Are dose adjustments needed based on the patient's renal function, hepatic function, or hematologic function? No . Are appropriate labs ordered prior to the start of patient's treatment? Yes . Other organ system assessment, if indicated: N/A . The following baseline labs, if indicated, have been ordered: pembrolizumab: baseline TSH +/- T4  Dose Assessment: . Are the drug doses appropriate? Yes . Are the following correct: o Drug concentrations Yes o IV fluid compatible with drug Yes o Administration routes Yes o Timing of therapy Yes . If applicable, does the patient have documented access for treatment and/or plans for port-a-cath placement? yes . If applicable, have lifetime cumulative doses been properly documented and assessed? yes Lifetime Dose Tracking  No doses have been documented on this patient for the following tracked chemicals: Doxorubicin, Epirubicin, Idarubicin, Daunorubicin, Mitoxantrone, Bleomycin, Oxaliplatin, Carboplatin, Liposomal Doxorubicin  o   Toxicity Monitoring/Prevention: . The patient has the following take home antiemetics prescribed: Ondansetron . The patient has the following take home medications prescribed: N/A . Medication allergies and previous infusion related reactions, if applicable, have been reviewed and addressed. Yes . The patient's current medication list has been assessed for drug-drug interactions with  their chemotherapy regimen. no significant drug-drug interactions were identified on review.  Order Review: . Are the treatment plan orders signed? No . Is the patient scheduled to see a provider prior to their treatment? Yes  I verify that I have reviewed each item in the above checklist and answered each question accordingly.  Wynona Neat 01/15/2020 4:39 PM

## 2020-01-17 ENCOUNTER — Other Ambulatory Visit: Payer: Self-pay

## 2020-01-17 ENCOUNTER — Ambulatory Visit (HOSPITAL_COMMUNITY): Payer: Medicare Other | Admitting: Hematology

## 2020-01-17 ENCOUNTER — Inpatient Hospital Stay (HOSPITAL_COMMUNITY): Payer: Medicare Other

## 2020-01-17 ENCOUNTER — Inpatient Hospital Stay (HOSPITAL_COMMUNITY): Payer: Medicare Other | Attending: Hematology

## 2020-01-17 VITALS — BP 108/76 | HR 84 | Temp 97.1°F | Resp 17

## 2020-01-17 DIAGNOSIS — C61 Malignant neoplasm of prostate: Secondary | ICD-10-CM | POA: Diagnosis not present

## 2020-01-17 DIAGNOSIS — Z5112 Encounter for antineoplastic immunotherapy: Secondary | ICD-10-CM | POA: Insufficient documentation

## 2020-01-17 DIAGNOSIS — Z5111 Encounter for antineoplastic chemotherapy: Secondary | ICD-10-CM | POA: Insufficient documentation

## 2020-01-17 DIAGNOSIS — C787 Secondary malignant neoplasm of liver and intrahepatic bile duct: Secondary | ICD-10-CM | POA: Diagnosis not present

## 2020-01-17 DIAGNOSIS — Z87891 Personal history of nicotine dependence: Secondary | ICD-10-CM | POA: Diagnosis not present

## 2020-01-17 DIAGNOSIS — C7931 Secondary malignant neoplasm of brain: Secondary | ICD-10-CM | POA: Diagnosis not present

## 2020-01-17 DIAGNOSIS — Z79899 Other long term (current) drug therapy: Secondary | ICD-10-CM | POA: Insufficient documentation

## 2020-01-17 DIAGNOSIS — C7951 Secondary malignant neoplasm of bone: Secondary | ICD-10-CM | POA: Diagnosis not present

## 2020-01-17 DIAGNOSIS — C7971 Secondary malignant neoplasm of right adrenal gland: Secondary | ICD-10-CM | POA: Diagnosis not present

## 2020-01-17 DIAGNOSIS — C3491 Malignant neoplasm of unspecified part of right bronchus or lung: Secondary | ICD-10-CM

## 2020-01-17 DIAGNOSIS — Z95828 Presence of other vascular implants and grafts: Secondary | ICD-10-CM

## 2020-01-17 LAB — COMPREHENSIVE METABOLIC PANEL
ALT: 63 U/L — ABNORMAL HIGH (ref 0–44)
AST: 36 U/L (ref 15–41)
Albumin: 3 g/dL — ABNORMAL LOW (ref 3.5–5.0)
Alkaline Phosphatase: 160 U/L — ABNORMAL HIGH (ref 38–126)
Anion gap: 11 (ref 5–15)
BUN: 19 mg/dL (ref 8–23)
CO2: 25 mmol/L (ref 22–32)
Calcium: 8.7 mg/dL — ABNORMAL LOW (ref 8.9–10.3)
Chloride: 96 mmol/L — ABNORMAL LOW (ref 98–111)
Creatinine, Ser: 1.05 mg/dL (ref 0.61–1.24)
GFR calc non Af Amer: >60 mL/min (ref >60)
Glucose, Bld: 172 mg/dL — ABNORMAL HIGH (ref 70–99)
Potassium: 4 mmol/L (ref 3.5–5.1)
Sodium: 132 mmol/L — ABNORMAL LOW (ref 135–145)
Total Bilirubin: 0.7 mg/dL (ref 0.3–1.2)
Total Protein: 7.1 g/dL (ref 6.5–8.1)

## 2020-01-17 LAB — TSH: TSH: 2.808 u[IU]/mL (ref 0.350–4.500)

## 2020-01-17 LAB — CBC WITH DIFFERENTIAL/PLATELET
Abs Immature Granulocytes: 0.09 10*3/uL — ABNORMAL HIGH (ref 0.00–0.07)
Basophils Absolute: 0.1 10*3/uL (ref 0.0–0.1)
Basophils Relative: 1 %
Eosinophils Absolute: 0.1 10*3/uL (ref 0.0–0.5)
Eosinophils Relative: 1 %
HCT: 46.8 % (ref 39.0–52.0)
Hemoglobin: 15.5 g/dL (ref 13.0–17.0)
Immature Granulocytes: 1 %
Lymphocytes Relative: 16 %
Lymphs Abs: 1.3 10*3/uL (ref 0.7–4.0)
MCH: 31.6 pg (ref 26.0–34.0)
MCHC: 33.1 g/dL (ref 30.0–36.0)
MCV: 95.5 fL (ref 80.0–100.0)
Monocytes Absolute: 0.6 10*3/uL (ref 0.1–1.0)
Monocytes Relative: 8 %
Neutro Abs: 5.7 10*3/uL (ref 1.7–7.7)
Neutrophils Relative %: 73 %
Platelets: 307 10*3/uL (ref 150–400)
RBC: 4.9 MIL/uL (ref 4.22–5.81)
RDW: 12.2 % (ref 11.5–15.5)
WBC: 7.9 10*3/uL (ref 4.0–10.5)
nRBC: 0 % (ref 0.0–0.2)

## 2020-01-17 MED ORDER — HYDROMORPHONE HCL 1 MG/ML IJ SOLN
INTRAMUSCULAR | Status: AC
  Filled 2020-01-17: qty 1

## 2020-01-17 MED ORDER — HYDROMORPHONE HCL 1 MG/ML IJ SOLN
1.0000 mg | Freq: Once | INTRAMUSCULAR | Status: AC
  Administered 2020-01-17: 1 mg via INTRAVENOUS

## 2020-01-17 MED ORDER — HYDROMORPHONE HCL 1 MG/ML IJ SOLN
2.0000 mg | Freq: Once | INTRAMUSCULAR | Status: AC
  Administered 2020-01-17: 1 mg via INTRAVENOUS

## 2020-01-17 MED ORDER — SODIUM CHLORIDE 0.9 % IV SOLN: Freq: Once | INTRAVENOUS | Status: AC

## 2020-01-17 MED ORDER — PALONOSETRON HCL INJECTION 0.25 MG/5ML
0.2500 mg | Freq: Once | INTRAVENOUS | Status: AC
  Administered 2020-01-17: 0.25 mg via INTRAVENOUS
  Filled 2020-01-17: qty 5

## 2020-01-17 MED ORDER — SODIUM CHLORIDE 0.9 % IV SOLN
10.0000 mg | Freq: Once | INTRAVENOUS | Status: AC
  Administered 2020-01-17: 10 mg via INTRAVENOUS
  Filled 2020-01-17: qty 10

## 2020-01-17 MED ORDER — HYDROMORPHONE HCL 1 MG/ML IJ SOLN
INTRAMUSCULAR | Status: AC
  Filled 2020-01-17: qty 2

## 2020-01-17 MED ORDER — SODIUM CHLORIDE 0.9 % IV SOLN
588.5000 mg | Freq: Once | INTRAVENOUS | Status: AC
  Administered 2020-01-17: 590 mg via INTRAVENOUS
  Filled 2020-01-17: qty 59

## 2020-01-17 MED ORDER — SODIUM CHLORIDE 0.9 % IV SOLN
150.0000 mg | Freq: Once | INTRAVENOUS | Status: AC
  Administered 2020-01-17: 150 mg via INTRAVENOUS
  Filled 2020-01-17: qty 150

## 2020-01-17 MED ORDER — SODIUM CHLORIDE 0.9 % IV SOLN
200.0000 mg | Freq: Once | INTRAVENOUS | Status: AC
  Administered 2020-01-17: 200 mg via INTRAVENOUS
  Filled 2020-01-17: qty 8

## 2020-01-17 MED ORDER — SODIUM CHLORIDE 0.9% FLUSH: 10.0000 mL | INTRAVENOUS | Status: DC | PRN

## 2020-01-17 MED ORDER — HEPARIN SOD (PORK) LOCK FLUSH 100 UNIT/ML IV SOLN
500.0000 [IU] | Freq: Once | INTRAVENOUS | Status: AC | PRN
  Administered 2020-01-17: 500 [IU]

## 2020-01-17 MED ORDER — SODIUM CHLORIDE 0.9 % IV SOLN
500.0000 mg/m2 | Freq: Once | INTRAVENOUS | Status: AC
  Administered 2020-01-17: 1100 mg via INTRAVENOUS
  Filled 2020-01-17: qty 40

## 2020-01-17 NOTE — Patient Instructions (Signed)
[facility] Discharge Instructions for Patients Receiving Chemotherapy  Today you received the following chemotherapy agents   To help prevent nausea and vomiting after your treatment, we encourage you to take your nausea medication   If you develop nausea and vomiting that is not controlled by your nausea medication, call the clinic.   BELOW ARE SYMPTOMS THAT SHOULD BE REPORTED IMMEDIATELY:  *FEVER GREATER THAN 100.5 F  *CHILLS WITH OR WITHOUT FEVER  NAUSEA AND VOMITING THAT IS NOT CONTROLLED WITH YOUR NAUSEA MEDICATION  *UNUSUAL SHORTNESS OF BREATH  *UNUSUAL BRUISING OR BLEEDING  TENDERNESS IN MOUTH AND THROAT WITH OR WITHOUT PRESENCE OF ULCERS  *URINARY PROBLEMS  *BOWEL PROBLEMS  UNUSUAL RASH Items with * indicate a potential emergency and should be followed up as soon as possible.  Feel free to call the clinic should you have any questions or concerns. The clinic phone number is [phone].  Please show the CHEMO ALERT CARD at check-in to the Emergency Department and triage nurse.   

## 2020-01-17 NOTE — Patient Instructions (Signed)
[facility] at [facility] Discharge Instructions  Labs drawn from portacath today   Thank you for choosing [facility] at [facility] to provide your oncology and hematology care.  To afford each patient quality time with our provider, please arrive at least 15 minutes before your scheduled appointment time.   If you have a lab appointment with the Cancer Center please come in thru the Main Entrance and check in at the main information desk.  You need to re-schedule your appointment should you arrive 10 or more minutes late.  We strive to give you quality time with our providers, and arriving late affects you and other patients whose appointments are after yours.  Also, if you no show three or more times for appointments you may be dismissed from the clinic at the providers discretion.     Again, thank you for choosing [facility].  Our hope is that these requests will decrease the amount of time that you wait before being seen by our physicians.       _____________________________________________________________  Should you have questions after your visit to [facility], please contact our office at [phone] and follow the prompts.  Our office hours are 8:00 a.m. and 4:30 p.m. Monday - Friday.  Please note that voicemails left after 4:00 p.m. may not be returned until the following business day.  We are closed weekends and major holidays.  You do have access to a nurse 24-7, just call the main number to the clinic [phone] and do not press any options, hold on the line and a nurse will answer the phone.    For prescription refill requests, have your pharmacy contact our office and allow 72 hours.    Due to Covid, you will need to wear a mask upon entering the hospital. If you do not have a mask, a mask will be given to you at the Main Entrance upon arrival. For doctor visits, patients may have 1 support person age 18  or older with them. For treatment visits, patients can not have anyone with them due to social distancing guidelines and our immunocompromised population.     

## 2020-01-17 NOTE — Progress Notes (Signed)
Labs reviewed with MD . Madaline Brilliant to proceed per MD.  0945-patient requesting something for pain. He took a pain pill this morning , patient states it only lasts about 5 1/2 hours. Notified MD. Will give 2 mg of dilaudid per MD order.   After 1mg  of dilaudid given, patient is sleeping and upon reassessment, he states he feels so much better. Patient states he wants to wait on the other 1 mg of dilaudid at the time.   Will address home pain medication with MD as well.   Per MD, patient can start taking pain medication every 6 hours for severe pain. Will educate patient and wife.    Case of ensure given to patient today.    Treatment given per orders. Patient tolerated it well without problems. Vitals stable and discharged home from clinic ambulatory in stable condition with wife at his side. Follow up as scheduled.

## 2020-01-18 ENCOUNTER — Other Ambulatory Visit (HOSPITAL_COMMUNITY): Payer: Self-pay | Admitting: *Deleted

## 2020-01-18 ENCOUNTER — Telehealth (HOSPITAL_COMMUNITY): Payer: Self-pay

## 2020-01-18 ENCOUNTER — Ambulatory Visit: Payer: Medicare Other | Admitting: Urology

## 2020-01-18 LAB — T4: T4, Total: 8 ug/dL (ref 4.5–12.0)

## 2020-01-18 MED ORDER — LACTULOSE 20 GM/30ML PO SOLN: ORAL | 2 refills | Status: AC

## 2020-01-18 NOTE — Telephone Encounter (Signed)
24 Hour follow up call made. Spoke with patient and he is feeling pretty good today. No issues with pain at this time. No n/v. Only complaint is constipation. LBM on 01/15/2020. Patient had been taking 2 stool softners am and pm. also took Miralx last pm and this am. No results. Will call in some lactulose per MD protocol. Will notify patient and wife and educate on how to take medication.

## 2020-01-21 ENCOUNTER — Ambulatory Visit (HOSPITAL_COMMUNITY)
Admission: RE | Admit: 2020-01-21 | Discharge: 2020-01-21 | Disposition: A | Discharge status: Home / Self Care | Payer: Medicare Other | Source: Ambulatory Visit | Attending: Hematology | Admitting: Hematology

## 2020-01-21 ENCOUNTER — Other Ambulatory Visit: Payer: Self-pay

## 2020-01-21 ENCOUNTER — Encounter: Payer: Self-pay | Admitting: Radiation Oncology

## 2020-01-21 ENCOUNTER — Telehealth (HOSPITAL_COMMUNITY): Payer: Self-pay

## 2020-01-21 DIAGNOSIS — C787 Secondary malignant neoplasm of liver and intrahepatic bile duct: Secondary | ICD-10-CM | POA: Diagnosis not present

## 2020-01-21 DIAGNOSIS — C3491 Malignant neoplasm of unspecified part of right bronchus or lung: Secondary | ICD-10-CM | POA: Insufficient documentation

## 2020-01-21 DIAGNOSIS — C7972 Secondary malignant neoplasm of left adrenal gland: Secondary | ICD-10-CM | POA: Insufficient documentation

## 2020-01-21 DIAGNOSIS — C7971 Secondary malignant neoplasm of right adrenal gland: Secondary | ICD-10-CM | POA: Insufficient documentation

## 2020-01-21 DIAGNOSIS — C7951 Secondary malignant neoplasm of bone: Secondary | ICD-10-CM | POA: Insufficient documentation

## 2020-01-21 MED ORDER — FLUDEOXYGLUCOSE F - 18 (FDG) INJECTION
12.1100 | Freq: Once | INTRAVENOUS | Status: AC | PRN
  Administered 2020-01-21: 12.11 via INTRAVENOUS

## 2020-01-21 NOTE — Progress Notes (Signed)
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 Jenetta Downer. -CT abdomen on 12/03/2019 showed posterior right lobe lesion measuring 2.1 x 1.9 cm and another anterior liver dome lesion measuring 1.3 x 1.2 cm. Right adrenal mass measuring 3.3 x 1.8 cm. -MRI of the liver on 12/05/2019 showed rim-enhancing lesion of the right adrenal gland measuring 3.0 x 2.5 cm. There is rim-enhancing lesion of the superior pole of the left kidney measuring 1.9 x 1.7 cm. Right lobe of the liver lesion measuring 2.2 x 2.0 cm. Anterior liver dome lesion measuring 1.1 x 0.9 cm. -CT chest without contrast on 12/07/2019 showed 1.9 cm spiculated nodule in the right lung apex. Right paratracheal lymph node 1.3 cm. 1.5 cm lower right paratracheal node. -Right lobe of liver needle biopsy consistent with adenocarcinoma, positive for CK7, TTF-1. Negative for CK20, CDX2, Napsin a and CK 5/6. -PD-L1 TPS 1% -Foundation 1 with TMB high, MS-stable, no other targetable mutations -MRI of the brain on 12/28/2019 shows bilateral cerebellar metastasis measuring 6 to 7 mm. Enhancing 1.2 cm left clival metastasis  Past or anticipated interventions, if any, per neurosurgery: no  Past or anticipated interventions, if any, per medical oncology:  01/17/2020 -  Chemotherapy   The patient had palonosetron (ALOXI) injection 0.25 mg, 0.25 mg, Intravenous,  Once, 0 of 4 cycles PEMEtrexed (ALIMTA) 1,100 mg in sodium chloride 0.9 % 100 mL chemo infusion, 500 mg/m2, Intravenous,  Once, 0 of 6 cycles CARBOplatin (PARAPLATIN) in sodium chloride 0.9 % 100 mL chemo infusion, , Intravenous,  Once, 0 of 4 cycles fosaprepitant (EMEND) 150 mg in sodium chloride 0.9 % 145 mL IVPB, 150 mg, Intravenous,  Once, 0 of 4 cycles pembrolizumab (KEYTRUDA) 200 mg in sodium  chloride 0.9 % 50 mL chemo infusion, 200 mg, Intravenous, Once, 0 of 6 cycles  for chemotherapy treatment.      Dose of Decadron, if applicable: no  Recent neurologic symptoms, if any:   Seizures: no  Headaches: yes, occasionally at night in the occipital region  Nausea: yes  Dizziness/ataxia: yes, mild  Difficulty with hand coordination: no  Focal numbness/weakness: generalized weakness. Tingling right forearm that radiates to hand making it weak  Visual deficits/changes: chronic long term floaters. Denies diplopia. Denies blurry vision.  Confusion/Memory deficits: poor name recall but "it eventually comes to me."  Painful bone metastases at present, if any: Reports pain across upper chest, low abdomen and upper back 3 on scale of 0-10 well managed with oxycodone every six hours. Also, reports right shoulder pain related to bad rotator cuff.  SAFETY ISSUES:  Prior radiation? yes, brachytherapy for prostate ca 2018  Pacemaker/ICD? no  Possible current pregnancy? no, male patient  Is the patient on methotrexate? no  Additional Complaints / other details: 74 year old male. Married with two children. Retired Administrator. Strong family hx of cancer.

## 2020-01-21 NOTE — Telephone Encounter (Signed)
Patient's wife is calling stating that since patient had chemo that he has vomiting twice since Thursday and has been nauseated this whole time. Patient is taking compazine but it is not helping. Wife wants to know what they need to do.   Dr. Raliegh Ip said to call in zofran 8mg  PO q 8h prn #60 REf#2. warn about possible constipation and take stool softener.  Rx called into [facility] in [place] per patient request. Warned of possible constipation and patient is already on a stool softener. Patient understands and is agreeable.

## 2020-01-22 ENCOUNTER — Other Ambulatory Visit: Payer: Self-pay

## 2020-01-22 ENCOUNTER — Ambulatory Visit
Admission: RE | Admit: 2020-01-22 | Discharge: 2020-01-22 | Disposition: A | Discharge status: Home / Self Care | Payer: Medicare Other | Source: Ambulatory Visit | Attending: Radiation Oncology | Admitting: Radiation Oncology

## 2020-01-22 ENCOUNTER — Other Ambulatory Visit: Payer: Self-pay | Admitting: Radiation Therapy

## 2020-01-22 ENCOUNTER — Encounter: Payer: Self-pay | Admitting: Radiation Oncology

## 2020-01-22 VITALS — BP 130/97 | HR 88 | Temp 98.0°F | Resp 18 | Ht 73.0 in | Wt 212.4 lb

## 2020-01-22 DIAGNOSIS — K219 Gastro-esophageal reflux disease without esophagitis: Secondary | ICD-10-CM | POA: Insufficient documentation

## 2020-01-22 DIAGNOSIS — C787 Secondary malignant neoplasm of liver and intrahepatic bile duct: Secondary | ICD-10-CM

## 2020-01-22 DIAGNOSIS — C61 Malignant neoplasm of prostate: Secondary | ICD-10-CM

## 2020-01-22 DIAGNOSIS — Z8546 Personal history of malignant neoplasm of prostate: Secondary | ICD-10-CM | POA: Diagnosis not present

## 2020-01-22 DIAGNOSIS — Z923 Personal history of irradiation: Secondary | ICD-10-CM | POA: Insufficient documentation

## 2020-01-22 DIAGNOSIS — C7949 Secondary malignant neoplasm of other parts of nervous system: Secondary | ICD-10-CM

## 2020-01-22 DIAGNOSIS — M1990 Unspecified osteoarthritis, unspecified site: Secondary | ICD-10-CM | POA: Diagnosis not present

## 2020-01-22 DIAGNOSIS — E785 Hyperlipidemia, unspecified: Secondary | ICD-10-CM | POA: Diagnosis not present

## 2020-01-22 DIAGNOSIS — Z7982 Long term (current) use of aspirin: Secondary | ICD-10-CM | POA: Insufficient documentation

## 2020-01-22 DIAGNOSIS — Z87891 Personal history of nicotine dependence: Secondary | ICD-10-CM | POA: Insufficient documentation

## 2020-01-22 DIAGNOSIS — C3411 Malignant neoplasm of upper lobe, right bronchus or lung: Secondary | ICD-10-CM

## 2020-01-22 DIAGNOSIS — C7931 Secondary malignant neoplasm of brain: Secondary | ICD-10-CM | POA: Diagnosis not present

## 2020-01-22 DIAGNOSIS — Z79899 Other long term (current) drug therapy: Secondary | ICD-10-CM | POA: Diagnosis not present

## 2020-01-22 DIAGNOSIS — C7951 Secondary malignant neoplasm of bone: Secondary | ICD-10-CM | POA: Insufficient documentation

## 2020-01-22 DIAGNOSIS — C7971 Secondary malignant neoplasm of right adrenal gland: Secondary | ICD-10-CM | POA: Insufficient documentation

## 2020-01-22 HISTORY — DX: Malignant neoplasm of unspecified part of unspecified bronchus or lung: C34.90

## 2020-01-22 NOTE — Progress Notes (Addendum)
Radiation Oncology         [phone] ________________________________  Initial outpatient Consultation  Name: Lance Stafford MRN: [id number]  Date of Service: 01/22/2020 DOB: [birth year]  VP:XTGGY, Georgeanna Lea, NP  [name], MD   REFERRING PHYSICIAN: [name], MD  DIAGNOSIS: 74 y.o. 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 Prostate cancer [facility])  C61     HISTORY OF PRESENT ILLNESS: Lance Stafford is a 74 y.o. male seen at the request of Dr. Delton Coombes. He has a history of prostate cancer, diagnosed in 05/2015 and treated with prostate seen implant in 09/2016. More recently, he was diagnosed with stave IV lung cancer. In summary, he initially presented to Dr. Montez Morita in gastroenterology with right upper quadrant pain. He underwent abdomen CT on 12/03/19 showing two liver lesions, measuring 2.1 cm and 1.3 cm, and a 3.3 cm right adrenal mass. These were further evaluated with liver MRI on 12/05/19 showing: 3 cm adrenal gland lesion; 1.9 cm left superior kidney lesion; 2.2 cm right lobe liver lesion; 1.1 cm liver dome lesion. Staging work up with chest CT on 12/07/19 revealed: 1.9 cm right lung apex nodule; 1.3 cm right paratracheal lymph node; 1.5 cm lower right paratracheal node. He proceeded to biopsy of the right lobe liver lesion on 12/13/19 revealing adenocarcinoma, felt to be of a lung or thyroid primary.  He was referred to Dr. Delton Coombes on 12/20/19, who recommended molecular studies on the biopsy sample and brain MRI to complete staging work up. The brain MRI was performed on 12/28/19 showing: bilateral cerebellar metastases measuring 6-7 mm; no metastases within cerebral hemispheres; enhancing 1.2 cm left clival metastasis.  He began combination chemoimmunotherapy, consisting of carboplatin, pembrolizumab and pemetrexed, on 01/17/20 to treat his worsening pain. He also underwent PET scan  yesterday, 01/21/20, the results of which are pending at the time of this note.  He has been kindly referred to me today for consideration of SRS therapy to the brain metastases.  PREVIOUS RADIATION THERAPY: Yes  09/30/2016: Prostate Brachytherapy, PSA less than 0.1 following with Dr. Jeffie Pollock  PAST MEDICAL HISTORY:  Past Medical History:  Diagnosis Date  . Arthritis   . Chronic low back pain    truck driver  . GERD (gastroesophageal reflux disease)   . Hyperlipidemia   . Lung cancer ([place])    stage IV non small cell lung ca  . Nocturia   . Port-A-Cath in place 01/10/2020  . Prostate cancer ([place]) UROLOGIST-  DR WRENN/  ONCOLOGIST-  DR Laisha Rau   dx 02/ 2017via TRUSPbx---  Stage T1c,  Gleason 3+3,  PSA 11.9  . Wears glasses   . Wears partial dentures    upper and lower      PAST SURGICAL HISTORY: Past Surgical History:  Procedure Laterality Date  . CATARACT EXTRACTION W/ INTRAOCULAR LENS  IMPLANT, BILATERAL  2015  . CHOLECYSTECTOMY    . CYSTOSCOPY  09/30/2016   Procedure: CYSTOSCOPY;  Surgeon: Irine Seal, MD;  Location: [facility];  Service: Urology;;  no seeds found in bladder  . PORTACATH PLACEMENT Left 01/14/2020   Procedure: INSERTION PORT-A-CATH;  Surgeon: Aviva Signs, MD;  Location: [facility];  Service: General;  Laterality: Left;  . RADIOACTIVE SEED IMPLANT N/A 09/30/2016   Procedure: RADIOACTIVE SEED IMPLANT/BRACHYTHERAPY IMPLANT, SPACE OAR;  Surgeon: Irine Seal, MD;  Location: [facility];  Service: Urology;  Laterality: N/A;  70 seeds implanted  . SPERMATOCELECTOMY Left 01/30/2015   Procedure: SPERMATOCELECTOMY;  Surgeon: Irine Seal, MD;  Location: [facility];  Service: Urology;  Laterality: Left;    FAMILY HISTORY:  Family History  Problem Relation Age of Onset  . Diabetes Mother   . Stroke Mother   . Throat cancer Father   . Skin cancer Sister   . Prostate cancer Brother   . Cancer Paternal Grandmother   . Clotting  disorder Daughter     SOCIAL HISTORY:  Social History   Socioeconomic History  . Marital status: Married    Spouse name: Not on file  . Number of children: 2  . Years of education: Not on file  . Highest education level: Not on file  Occupational History  . Occupation: truck Geophysicist/field seismologist  Tobacco Use  . Smoking status: Former Smoker    Packs/day: 2.00    Years: 33.00    Pack years: 66.00    Types: Cigarettes    Quit date: 01/23/1991    Years since quitting: 29.0  . Smokeless tobacco: Never Used  Substance and Sexual Activity  . Alcohol use: No  . Drug use: No  . Sexual activity: Yes  Other Topics Concern  . Not on file  Social History Narrative  . Not on file   Social Determinants of Health   Financial Resource Strain: Low Risk   . Difficulty of Paying Living Expenses: Not hard at all  Food Insecurity: No Food Insecurity  . Worried About Charity fundraiser in the Last Year: Never true  . Ran Out of Food in the Last Year: Never true  Transportation Needs: No Transportation Needs  . Lack of Transportation (Medical): No  . Lack of Transportation (Non-Medical): No  Physical Activity: Sufficiently Active  . Days of Exercise per Week: 1 day  . Minutes of Exercise per Session: 150+ min  Stress: No Stress Concern Present  . Feeling of Stress : Only a little  Social Connections: Socially Integrated  . Frequency of Communication with Friends and Family: More than three times a week  . Frequency of Social Gatherings with Friends and Family: Three times a week  . Attends Religious Services: More than 4 times per year  . Active Member of Clubs or Organizations: Yes  . Attends Archivist Meetings: More than 4 times per year  . Marital Status: Married  Human resources officer Violence: Not At Risk  . Fear of Current or Ex-Partner: No  . Emotionally Abused: No  . Physically Abused: No  . Sexually Abused: No    ALLERGIES: Patient has no known allergies.  MEDICATIONS:   Current Outpatient Medications  Medication Sig Dispense Refill  . aspirin EC 81 MG tablet Take 81 mg by mouth daily.     Marland Kitchen atorvastatin (LIPITOR) 40 MG tablet Take 40 mg by mouth daily.    Marland Kitchen CARBOPLATIN IV Inject into the vein every 21 ( twenty-one) days.    Marland Kitchen docusate sodium (COLACE) 100 MG capsule Take 200 mg by mouth daily.    . folic acid (FOLVITE) 1 MG tablet Take 1 tablet (1 mg total) by mouth daily. 90 tablet 1  . ibuprofen (ADVIL) 200 MG tablet Take 400 mg by mouth every 8 (eight) hours as needed for moderate pain.     . Lactulose 20 GM/30ML SOLN Take 30 ml by mouth every 3 hours until bowel movement is had; then continue taking 30 ml by mouth once daily 450 mL  2  . lidocaine-prilocaine (EMLA) cream Apply a small amount to port a cath site and cover with plastic wrap 1 hour prior to chemotherapy appointments 30 g 3  . omeprazole (PRILOSEC) 40 MG capsule Take 40 mg by mouth daily.    . ondansetron (ZOFRAN) 8 MG tablet Take 8 mg by mouth 3 (three) times daily.    Marland Kitchen oxyCODONE (OXY IR/ROXICODONE) 5 MG immediate release tablet Take 2 tablets (10 mg total) by mouth every 8 (eight) hours as needed for severe pain. (Patient taking differently: Take 10 mg by mouth every 6 (six) hours as needed for severe pain. ) 180 tablet 0  . PEMEtrexed 500 mg/m2 in sodium chloride 0.9 % 100 mL Inject 500 mg/m2 into the vein every 21 ( twenty-one) days.    . prochlorperazine (COMPAZINE) 10 MG tablet Take 1 tablet (10 mg total) by mouth every 6 (six) hours as needed (Nausea or vomiting). 30 tablet 1  . sodium chloride 0.9 % SOLN 50 mL with pembrolizumab 100 MG/4ML SOLN 2 mg/kg Inject 200 mg into the vein every 21 ( twenty-one) days.     No current facility-administered medications for this encounter.    REVIEW OF SYSTEMS:  On review of systems, the patient reports that he is doing well overall. He denies any chest pain, shortness of breath, cough, fevers, chills, night sweats. He reports occasional headaches at  night in the occipital region, nausea, mild dizziness, generalized weakness, tingling in right forearm that radiated to hand, and slowed name recall. He denies seizures, difficulty with hand coordination, and diplopia or blurry vision. In regards to pain, he reports well managed pain across upper chest, lower abdomen, and upper back, rated 3/10 on oxycodone. He also notes a weight loss of about 20-30 pounds because of the pain and nausea. A complete review of systems is obtained and is otherwise negative.    PHYSICAL EXAM:  Wt Readings from Last 3 Encounters:  01/17/20 216 lb 12.8 oz (98.3 kg)  01/14/20 213 lb 3.2 oz (96.7 kg)  01/10/20 220 lb (99.8 kg)   Temp Readings from Last 3 Encounters:  01/17/20 (!) 97.1 F (36.2 C) (Oral)  01/17/20 (!) 97 F (36.1 C)  01/14/20 98 F (36.7 C) (Oral)   BP Readings from Last 3 Encounters:  01/17/20 108/76  01/17/20 122/69  01/14/20 124/85   Pulse Readings from Last 3 Encounters:  01/17/20 84  01/17/20 67  01/14/20 73    /10  In general this is a well appearing man in no acute distress. He's alert and oriented x4 and appropriate throughout the examination. Cardiopulmonary assessment is negative for acute distress and he exhibits normal effort.    KPS = 90  100 - Normal; no complaints; no evidence of disease. 90   - Able to carry on normal activity; minor signs or symptoms of disease. 80   - Normal activity with effort; some signs or symptoms of disease. 85   - Cares for self; unable to carry on normal activity or to do active work. 60   - Requires occasional assistance, but is able to care for most of his personal needs. 50   - Requires considerable assistance and frequent medical care. 76   - Disabled; requires special care and assistance. 94   - Severely disabled; hospital admission is indicated although death not imminent. 32   - Very sick; hospital admission necessary; active supportive treatment necessary. 10   - Moribund; fatal  processes progressing rapidly. 0     -  Dead  Karnofsky DA, Abelmann [place], Craver LS and Burchenal [facility] [phone]) The use of the nitrogen mustards in the palliative treatment of carcinoma: with particular reference to bronchogenic carcinoma Cancer 1 634-56  LABORATORY DATA:  Lab Results  Component Value Date   WBC 7.9 01/17/2020   HGB 15.5 01/17/2020   HCT 46.8 01/17/2020   MCV 95.5 01/17/2020   PLT 307 01/17/2020   Lab Results  Component Value Date   NA 132 (L) 01/17/2020   K 4.0 01/17/2020   CL 96 (L) 01/17/2020   CO2 25 01/17/2020   Lab Results  Component Value Date   ALT 63 (H) 01/17/2020   AST 36 01/17/2020   ALKPHOS 160 (H) 01/17/2020   BILITOT 0.7 01/17/2020     RADIOGRAPHY: MR Brain W Wo Contrast  Result Date: 12/28/2019 CLINICAL DATA:  Non-small cell lung cancer, staging. EXAM: MRI HEAD WITHOUT AND WITH CONTRAST TECHNIQUE: Multiplanar, multiecho pulse sequences of the brain and surrounding structures were obtained without and with intravenous contrast. CONTRAST:  31mL GADAVIST GADOBUTROL 1 MMOL/ML IV SOLN COMPARISON:  None. FINDINGS: Brain: Lesions include: - Intrinsically T1 hyperintense 7 mm right cerebellar lesion with minimal enhancement and peripheral DWI hyperintensity (18:29). - Intrinsically T1 hyperintense 6 mm inferior left cerebellar lesion (18:16) without definite enhancement. No acute infarct. No SWI signal abnormality. No midline shift, ventriculomegaly or extra-axial fluid collection. Vascular: Normal flow voids. Skull and upper cervical spine: Enhancing 1.2 cm left clival lesion (18:22). Sinuses/Orbits: Sequela of bilateral lens replacement. Clear paranasal sinuses and mastoid air cells. Other: None. 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 No metastases within the cerebral hemispheres. Enhancing 1.2 cm left clival metastasis. These results will be called to the ordering clinician or representative by the Radiologist Assistant, and communication  documented in the PACS or [employer]. Electronically Signed   By: Primitivo Gauze M.D.   On: 12/28/2019 08:52   DG Chest Port 1 View  Result Date: 01/14/2020 CLINICAL DATA:  Left chest port placement, history of prostate cancer EXAM: PORTABLE CHEST 1 VIEW COMPARISON:  CT chest dated 12/07/2019 FINDINGS: Known right upper lobe nodule is obscured by the right anterior 1st rib. Lungs are essentially clear.  No pleural effusion or pneumothorax. The heart is normal in size. Left chest power port terminates in the mid SVC. IMPRESSION: Left chest power port terminates in the mid SVC. Known right upper lobe nodule is obscured by the right anterior 1st rib. Electronically Signed   By: Julian Hy M.D.   On: 01/14/2020 08:23   DG C-Arm 1-60 Min-No Report  Result Date: 01/14/2020 Fluoroscopy was utilized by the requesting physician.  No radiographic interpretation.      IMPRESSION/PLAN: 18. 74 y.o. man with brain metastases from stage IV non-small cell lung cancer. At this point, the patient would potentially benefit from radiotherapy. The options include whole brain irradiation versus stereotactic radiosurgery. There are pros and cons associated with each of these potential treatment options. Whole brain radiotherapy would treat the known metastatic deposits and help provide some reduction of risk for future brain metastases. However, whole brain radiotherapy carries potential risks including hair loss, subacute somnolence, and neurocognitive changes including a possible reduction in short-term memory. Whole brain radiotherapy also may carry a lower likelihood of tumor control at the treatment sites because of the low-dose used. Stereotactic radiosurgery carries a higher likelihood for local tumor control at the targeted sites with lower associated risk for neurocognitive changes such as memory loss. However, the use  of stereotactic radiosurgery in this setting may leave the patient at increased  risk for new brain metastases elsewhere in the brain as high as 50-60%. Accordingly, patients who receive stereotactic radiosurgery in this setting should undergo ongoing surveillance imaging with brain MRI more frequently in order to identify and treat new small brain metastases before they become symptomatic. Stereotactic radiosurgery does carry some different risks, including a risk of radionecrosis.  PLAN: Today, I reviewed the findings and workup thus far with the patient. We discussed the dilemma regarding whole brain radiotherapy versus stereotactic radiosurgery. We discussed the pros and cons of each. We also discussed the logistics and delivery of each. We reviewed the results associated with each of the treatments described above. The patient seems to understand the treatment options and would like to proceed with stereotactic radiosurgery.  Given his minor infrequent headaches, the question about starting dexamethasone was discussed.  Since he is receiving immunotherapy, we elected to proceed without dexamethasone, understanding that we can use it later, if any symptoms of cerebral edema, if Dr. Raliegh Ip is in agreement.  I spent 60 minutes minutes for this encounter including chart prep, film review, face-to-face patient meeting with counseling and coordination of care, followed by placing orders and documentation completion.     ------------------------------------------------   Tyler Pita, MD [place] Director and Director of Stereotactic Radiosurgery Direct Dial: [phone]  Fax: [phone] [email or web address]  Skype  [email or web address]   This document serves as a record of services personally performed by Tyler Pita, MD. It was created on his behalf by Wilburn Mylar, a trained medical scribe. The creation of this record is based on the scribe's personal observations and the provider's statements to them. This document has been checked and approved by  the attending provider.

## 2020-01-24 ENCOUNTER — Inpatient Hospital Stay (HOSPITAL_COMMUNITY): Payer: Medicare Other

## 2020-01-24 ENCOUNTER — Inpatient Hospital Stay (HOSPITAL_BASED_OUTPATIENT_CLINIC_OR_DEPARTMENT_OTHER): Payer: Medicare Other | Admitting: Oncology

## 2020-01-24 ENCOUNTER — Other Ambulatory Visit: Payer: Self-pay

## 2020-01-24 ENCOUNTER — Ambulatory Visit
Admission: RE | Admit: 2020-01-24 | Discharge: 2020-01-24 | Disposition: A | Discharge status: Home / Self Care | Payer: Medicare Other | Source: Ambulatory Visit | Attending: Radiation Oncology | Admitting: Radiation Oncology

## 2020-01-24 VITALS — BP 126/84 | HR 83 | Temp 97.5°F | Resp 18 | Wt 213.8 lb

## 2020-01-24 DIAGNOSIS — C7931 Secondary malignant neoplasm of brain: Secondary | ICD-10-CM

## 2020-01-24 DIAGNOSIS — C3490 Malignant neoplasm of unspecified part of unspecified bronchus or lung: Secondary | ICD-10-CM | POA: Diagnosis not present

## 2020-01-24 DIAGNOSIS — Z5112 Encounter for antineoplastic immunotherapy: Secondary | ICD-10-CM | POA: Diagnosis not present

## 2020-01-24 DIAGNOSIS — C7949 Secondary malignant neoplasm of other parts of nervous system: Secondary | ICD-10-CM

## 2020-01-24 DIAGNOSIS — C7951 Secondary malignant neoplasm of bone: Secondary | ICD-10-CM

## 2020-01-24 DIAGNOSIS — C3491 Malignant neoplasm of unspecified part of right bronchus or lung: Secondary | ICD-10-CM

## 2020-01-24 LAB — COMPREHENSIVE METABOLIC PANEL
ALT: 44 U/L (ref 0–44)
AST: 27 U/L (ref 15–41)
Albumin: 2.8 g/dL — ABNORMAL LOW (ref 3.5–5.0)
Alkaline Phosphatase: 157 U/L — ABNORMAL HIGH (ref 38–126)
Anion gap: 10 (ref 5–15)
BUN: 12 mg/dL (ref 8–23)
CO2: 28 mmol/L (ref 22–32)
Calcium: 8.5 mg/dL — ABNORMAL LOW (ref 8.9–10.3)
Chloride: 93 mmol/L — ABNORMAL LOW (ref 98–111)
Creatinine, Ser: 0.92 mg/dL (ref 0.61–1.24)
GFR, Estimated: >60 mL/min (ref >60)
Glucose, Bld: 115 mg/dL — ABNORMAL HIGH (ref 70–99)
Potassium: 4.1 mmol/L (ref 3.5–5.1)
Sodium: 131 mmol/L — ABNORMAL LOW (ref 135–145)
Total Bilirubin: 0.9 mg/dL (ref 0.3–1.2)
Total Protein: 6.7 g/dL (ref 6.5–8.1)

## 2020-01-24 LAB — CBC WITH DIFFERENTIAL/PLATELET
Abs Immature Granulocytes: 0.01 10*3/uL (ref 0.00–0.07)
Basophils Absolute: 0 10*3/uL (ref 0.0–0.1)
Basophils Relative: 1 %
Eosinophils Absolute: 0.1 10*3/uL (ref 0.0–0.5)
Eosinophils Relative: 2 %
HCT: 41 % (ref 39.0–52.0)
Hemoglobin: 14.2 g/dL (ref 13.0–17.0)
Immature Granulocytes: 0 %
Lymphocytes Relative: 35 %
Lymphs Abs: 0.8 10*3/uL (ref 0.7–4.0)
MCH: 31.9 pg (ref 26.0–34.0)
MCHC: 34.6 g/dL (ref 30.0–36.0)
MCV: 92.1 fL (ref 80.0–100.0)
Monocytes Absolute: 0.3 10*3/uL (ref 0.1–1.0)
Monocytes Relative: 12 %
Neutro Abs: 1.2 10*3/uL — ABNORMAL LOW (ref 1.7–7.7)
Neutrophils Relative %: 50 %
Platelets: 155 10*3/uL (ref 150–400)
RBC: 4.45 MIL/uL (ref 4.22–5.81)
RDW: 11.5 % (ref 11.5–15.5)
WBC: 2.4 10*3/uL — ABNORMAL LOW (ref 4.0–10.5)
nRBC: 0 % (ref 0.0–0.2)

## 2020-01-24 MED ORDER — GADOBENATE DIMEGLUMINE 529 MG/ML IV SOLN
20.0000 mL | Freq: Once | INTRAVENOUS | Status: AC | PRN
  Administered 2020-01-24: 20 mL via INTRAVENOUS

## 2020-01-24 NOTE — Progress Notes (Signed)
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  PRIOR THERAPY: None  NGS Results: PD-L1 TPS 1%, Foundation 1 MS--stable, TMB 10 Muts/Mb  CURRENT THERAPY: Carbo/Keytruda/Alimta  BRIEF ONCOLOGIC HISTORY:  Oncology History  Malignant neoplasm of right lung (Lance Stafford)  12/20/2019 Initial Diagnosis   Adenocarcinoma of lung, stage 4, right (Lance Stafford)   12/20/2019 Cancer Staging   Staging form: Lung, AJCC 8th Edition - Clinical: Stage IVB (cT1b, cN2, pM1c) - Signed by Lance Jack, MD on 12/20/2019   12/25/2019 Genetic Testing   PDL1     12/31/2019 [place] [facility]     01/17/2020 -  Chemotherapy   The patient had palonosetron (ALOXI) injection 0.25 mg, 0.25 mg, Intravenous,  Once, 1 of 4 cycles Administration: 0.25 mg (01/17/2020) PEMEtrexed (ALIMTA) 1,100 mg in sodium chloride 0.9 % 100 mL chemo infusion, 500 mg/m2 = 1,100 mg, Intravenous,  Once, 1 of 6 cycles Administration: 1,100 mg (01/17/2020) CARBOplatin (PARAPLATIN) 590 mg in sodium chloride 0.9 % 250 mL chemo infusion, 590 mg (100 % of original dose 588.5 mg), Intravenous,  Once, 1 of 4 cycles Dose modification:   (original dose 588.5 mg, Cycle 1) Administration: 590 mg (01/17/2020) fosaprepitant (EMEND) 150 mg in sodium chloride 0.9 % 145 mL IVPB, 150 mg, Intravenous,  Once, 1 of 4 cycles Administration: 150 mg (01/17/2020) pembrolizumab (KEYTRUDA) 200 mg in sodium chloride 0.9 % 50 mL chemo infusion, 200 mg, Intravenous, Once, 1 of 6 cycles Administration: 200 mg (01/17/2020)  for chemotherapy treatment.      CANCER STAGING: Cancer Staging Malignant neoplasm of right lung Lance Stafford) Staging form: Lung, AJCC 8th Edition - Clinical: Stage IVB (cT1b, cN2, pM1c) - Signed by Lance Jack, MD on  12/20/2019   INTERVAL HISTORY:  [name], a 74 y.o. male, returns for routine follow-up of his stage IV right lung adenocarcinoma. Lance Stafford was last seen 01/14/2020 prior to first cycle of carbo/Keytruda and Alimta.   He is accompanied by his wife.  He reports feeling poorly the first 3 days after treatment.  He was very, nauseated and had one episode of vomiting.  This is since improved dramatically.  He has got more energy and appetite has returned.  Persistent pain in his lower abdomen and right shoulder which has improved slightly with the use of oxycodone.  Denies any fevers or illness.  Reports he feels he could go back to work.   Has a scheduled MRI of his brain this afternoon.  Has simulation for radiation tomorrow.  REVIEW OF SYSTEMS:  Review of Systems  Constitutional: Positive for fatigue. Negative for appetite change, chills and fever.  HENT:  Negative.  Negative for hearing loss, lump/mass, mouth sores and nosebleeds.   Eyes: Negative.  Negative for eye problems.  Respiratory: Positive for chest tightness and shortness of breath. Negative for cough and hemoptysis.   Cardiovascular: Negative.  Negative for chest pain and leg swelling.  Gastrointestinal: Positive for constipation, nausea and vomiting (1 episode). Negative for abdominal pain, blood in stool and diarrhea.  Endocrine: Negative.  Negative for hot flashes.  Genitourinary: Negative.  Negative for bladder incontinence, difficulty urinating, dysuria, frequency and hematuria.   Musculoskeletal: Negative.  Negative for back pain, flank pain, gait problem and myalgias.  Skin: Negative.  Negative for  itching and rash.  Neurological: Positive for headaches. Negative for dizziness, gait problem, light-headedness and numbness.  Hematological: Negative.  Negative for adenopathy.  Psychiatric/Behavioral: Negative for confusion. The patient is not nervous/anxious.     PAST MEDICAL/SURGICAL HISTORY:  Past Medical History:    Diagnosis Date  . Arthritis   . Chronic low back pain    truck driver  . GERD (gastroesophageal reflux disease)   . Hyperlipidemia   . Lung cancer (Lance Stafford)    stage IV non small cell lung ca  . Nocturia   . Port-A-Cath in place 01/10/2020  . Prostate cancer ([place]) UROLOGIST-  Lance Stafford/  ONCOLOGIST-  Lance Stafford   dx 02/ 2017via TRUSPbx---  Stage T1c,  Gleason 3+3,  PSA 11.9  . Wears glasses   . Wears partial dentures    upper and lower   Past Surgical History:  Procedure Laterality Date  . CATARACT EXTRACTION W/ INTRAOCULAR LENS  IMPLANT, BILATERAL  2015  . CHOLECYSTECTOMY    . CYSTOSCOPY  09/30/2016   Procedure: CYSTOSCOPY;  Surgeon: Lance Seal, MD;  Location: [facility];  Service: Urology;;  no seeds found in bladder  . PORTACATH PLACEMENT Left 01/14/2020   Procedure: INSERTION PORT-A-CATH;  Surgeon: Lance Signs, MD;  Location: [facility];  Service: General;  Laterality: Left;  . RADIOACTIVE SEED IMPLANT N/A 09/30/2016   Procedure: RADIOACTIVE SEED IMPLANT/BRACHYTHERAPY IMPLANT, SPACE OAR;  Surgeon: Lance Seal, MD;  Location: [facility];  Service: Urology;  Laterality: N/A;  70 seeds implanted  . SPERMATOCELECTOMY Left 01/30/2015   Procedure: SPERMATOCELECTOMY;  Surgeon: Lance Seal, MD;  Location: [facility];  Service: Urology;  Laterality: Left;    SOCIAL HISTORY:  Social History   Socioeconomic History  . Marital status: Married    Spouse name: Not on file  . Number of children: 2  . Years of education: Not on file  . Highest education level: Not on file  Occupational History  . Occupation: truck Geophysicist/field seismologist  Tobacco Use  . Smoking status: Former Smoker    Packs/day: 2.00    Years: 33.00    Pack years: 66.00    Types: Cigarettes    Quit date: 01/23/1991    Years since quitting: 29.0  . Smokeless tobacco: Never Used  Vaping Use  . Vaping Use: Never used  Substance and Sexual Activity  . Alcohol use: No  . Drug use: No  .  Sexual activity: Yes  Other Topics Concern  . Not on file  Social History Narrative  . Not on file   Social Determinants of Health   Financial Resource Strain: Low Risk   . Difficulty of Paying Living Expenses: Not hard at all  Food Insecurity: No Food Insecurity  . Worried About Charity fundraiser in the Last Year: Never true  . Ran Out of Food in the Last Year: Never true  Transportation Needs: No Transportation Needs  . Lack of Transportation (Medical): No  . Lack of Transportation (Non-Medical): No  Physical Activity: Sufficiently Active  . Days of Exercise per Week: 1 day  . Minutes of Exercise per Session: 150+ min  Stress: No Stress Concern Present  . Feeling of Stress : Only a little  Social Connections: Socially Integrated  . Frequency of Communication with Friends and Family: More than three times a week  . Frequency of Social Gatherings with Friends and Family: Three times a week  . Attends Religious Services: More than 4 times per  year  . Active Member of Clubs or Organizations: Yes  . Attends Archivist Meetings: More than 4 times per year  . Marital Status: Married  Human resources officer Violence: Not At Risk  . Fear of Current or Ex-Partner: No  . Emotionally Abused: No  . Physically Abused: No  . Sexually Abused: No    FAMILY HISTORY:  Family History  Problem Relation Age of Onset  . Diabetes Mother   . Stroke Mother   . Throat cancer Mother   . Skin cancer Sister   . Prostate cancer Brother   . Cancer Paternal Grandmother   . Clotting disorder Daughter     CURRENT MEDICATIONS:  Current Outpatient Medications  Medication Sig Dispense Refill  . aspirin EC 81 MG tablet Take 81 mg by mouth daily.     Marland Kitchen atorvastatin (LIPITOR) 40 MG tablet Take 40 mg by mouth daily.    Marland Kitchen CARBOPLATIN IV Inject into the vein every 21 ( twenty-one) days.    Marland Kitchen docusate sodium (COLACE) 100 MG capsule Take 200 mg by mouth daily.    . folic acid (FOLVITE) 1 MG tablet  Take 1 tablet (1 mg total) by mouth daily. 90 tablet 1  . ibuprofen (ADVIL) 200 MG tablet Take 400 mg by mouth every 8 (eight) hours as needed for moderate pain.     . Lactulose 20 GM/30ML SOLN Take 30 ml by mouth every 3 hours until bowel movement is had; then continue taking 30 ml by mouth once daily 450 mL 2  . lidocaine-prilocaine (EMLA) cream Apply a small amount to port a cath site and cover with plastic wrap 1 hour prior to chemotherapy appointments 30 g 3  . omeprazole (PRILOSEC) 40 MG capsule Take 40 mg by mouth daily.    . ondansetron (ZOFRAN) 8 MG tablet Take 8 mg by mouth 3 (three) times daily.    Marland Kitchen oxyCODONE (OXY IR/ROXICODONE) 5 MG immediate release tablet Take 2 tablets (10 mg total) by mouth every 8 (eight) hours as needed for severe pain. (Patient taking differently: Take 10 mg by mouth every 6 (six) hours as needed for severe pain. ) 180 tablet 0  . PEMEtrexed 500 mg/m2 in sodium chloride 0.9 % 100 mL Inject 500 mg/m2 into the vein every 21 ( twenty-one) days.    . prochlorperazine (COMPAZINE) 10 MG tablet Take 1 tablet (10 mg total) by mouth every 6 (six) hours as needed (Nausea or vomiting). 30 tablet 1  . sodium chloride 0.9 % SOLN 50 mL with pembrolizumab 100 MG/4ML SOLN 2 mg/kg Inject 200 mg into the vein every 21 ( twenty-one) days.     No current facility-administered medications for this visit.    ALLERGIES:  No Known Allergies  PHYSICAL EXAM:  Performance status (ECOG): 1 - Symptomatic but completely ambulatory  There were no vitals filed for this visit. Wt Readings from Last 3 Encounters:  01/22/20 212 lb 6.4 oz (96.3 kg)  01/17/20 216 lb 12.8 oz (98.3 kg)  01/14/20 213 lb 3.2 oz (96.7 kg)   Physical Exam Vitals reviewed.  Constitutional:      Appearance: Normal appearance.  Cardiovascular:     Rate and Rhythm: Normal rate and regular rhythm.     Pulses: Normal pulses.     Heart sounds: Normal heart sounds.  Pulmonary:     Effort: Pulmonary effort is  normal.     Breath sounds: Normal breath sounds.  Chest:     Comments: Port-a-Cath in L  chest Abdominal:     Palpations: Abdomen is soft.     Tenderness: There is abdominal tenderness in the right upper quadrant.  Neurological:     General: No focal deficit present.     Mental Status: He is alert and oriented to person, place, and time.  Psychiatric:        Mood and Affect: Mood normal.        Behavior: Behavior normal.      LABORATORY DATA:  I have reviewed the labs as listed.  CBC Latest Ref Rng & Units 01/17/2020 12/20/2019 12/13/2019  WBC 4.0 - 10.5 K/uL 7.9 6.0 5.3  Hemoglobin 13.0 - 17.0 g/dL 15.5 15.8 14.9  Hematocrit 39 - 52 % 46.8 47.3 43.7  Platelets 150 - 400 K/uL 307 282 224   CMP Latest Ref Rng & Units 01/17/2020 12/20/2019 12/03/2019  Glucose 70 - 99 mg/dL 172(H) 124(H) -  BUN 8 - 23 mg/dL 19 14 -  Creatinine 0.61 - 1.24 mg/dL 1.05 1.07 0.90  Sodium 135 - 145 mmol/L 132(L) 137 -  Potassium 3.5 - 5.1 mmol/L 4.0 4.0 -  Chloride 98 - 111 mmol/L 96(L) 97(L) -  CO2 22 - 32 mmol/L 25 26 -  Calcium 8.9 - 10.3 mg/dL 8.7(L) 9.1 -  Total Protein 6.5 - 8.1 g/dL 7.1 7.8 -  Total Bilirubin 0.3 - 1.2 mg/dL 0.7 1.0 -  Alkaline Phos 38 - 126 U/L 160(H) 107 -  AST 15 - 41 U/L 36 18 -  ALT 0 - 44 U/L 63(H) 18 -    DIAGNOSTIC IMAGING:   I have independently reviewed the scans and discussed with the patient. MR Brain W Wo Contrast  Result Date: 12/28/2019 CLINICAL DATA:  Non-small cell lung cancer, staging. EXAM: MRI HEAD WITHOUT AND WITH CONTRAST TECHNIQUE: Multiplanar, multiecho pulse sequences of the brain and surrounding structures were obtained without and with intravenous contrast. CONTRAST:  63m GADAVIST GADOBUTROL 1 MMOL/ML IV SOLN COMPARISON:  None. FINDINGS: Brain: Lesions include: - Intrinsically T1 hyperintense 7 mm right cerebellar lesion with minimal enhancement and peripheral DWI hyperintensity (18:29). - Intrinsically T1 hyperintense 6 mm inferior left cerebellar lesion  (18:16) without definite enhancement. No acute infarct. No SWI signal abnormality. No midline shift, ventriculomegaly or extra-axial fluid collection. Vascular: Normal flow voids. Skull and upper cervical spine: Enhancing 1.2 cm left clival lesion (18:22). Sinuses/Orbits: Sequela of bilateral lens replacement. Clear paranasal sinuses and mastoid air cells. Other: None. 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 No metastases within the cerebral hemispheres. Enhancing 1.2 cm left clival metastasis. These results will be called to the ordering clinician or representative by the Radiologist Assistant, and communication documented in the PACS or [employer] Electronically Signed   By: CPrimitivo GauzeM.D.   On: 12/28/2019 08:52   NM PET Image Initial (PI) Skull Base To Thigh  Result Date: 01/22/2020 CLINICAL DATA:  Initial treatment strategy for metastatic non-small cell lung cancer. History of prostate cancer. EXAM: NUCLEAR MEDICINE PET SKULL BASE TO THIGH TECHNIQUE: 12.1 mCi F-18 FDG was injected intravenously. Full-ring PET imaging was performed from the skull base to thigh after the radiotracer. CT data was obtained and used for attenuation correction and anatomic localization. Fasting blood glucose: 131 mg/dl COMPARISON:  CT chest dated 12/07/2019. MRI abdomen dated 12/05/2019. CT abdomen dated 12/03/2019. FINDINGS: Mediastinal blood pool activity: SUV max 3.0 Liver activity: SUV max NA NECK: No hypermetabolic cervical lymphadenopathy. Incidental CT findings: none CHEST: 1.8 x 2.0 cm spiculated nodule in  the right lung apex with associated pleural tail (series 3/image 86), max SUV 7.3, corresponding to the patient's known primary bronchogenic neoplasm. Associated thoracic nodal metastases, including: --16 mm short axis high right paratracheal node (series 3/image 104), max SUV 4.9 --12 mm short axis right hilar node (series 3/image 115), max SUV 8.1 --11 mm short axis right  azygoesophageal recess node (series 3/image 113), max SUV 6.3 --8 mm short axis right retrocrural node (series 3/image 163), max SUV 5.4 Incidental CT findings: Left chest port terminates in the upper SVC. Atherosclerotic calcifications of the aortic arch. Coronary atherosclerosis of the LAD. ABDOMEN/PELVIS: Dominant 2.5 cm metastasis in segment 6 (series 3/image 183), max SUV 6.0. Additional smaller hepatic lesions are incompletely characterized (series 3/images 151, 153, 172, and 174). 2.5 cm right adrenal metastasis (series 3/image 183), max SUV 8.8. 1.4 cm left adrenal metastasis (series 3/image 183), max SUV 4.6. No abnormal hypermetabolism in the pancreas or spleen. No hypermetabolic abdominopelvic lymphadenopathy. Brachytherapy seeds in the prostate. Incidental CT findings: Prior cholecystectomy. Atherosclerotic calcifications the abdominal aorta and branch vessels. Fusiform ectasia of the infrarenal abdominal aorta, measuring up to 2.8 cm (series 3/image 219). SKELETON: Multifocal osseous metastases in the visualized axial and appendicular skeleton. For example: --Right transverse process at C7, max SUV 6.6 --Multiple ribs, including the right lateral 2nd rib, max SUV 7.5 --Sternum, max SUV 8.9 --Multiple thoracic vertebral bodies, including the right T6 vertebral body, max SUV 9.1 --Right scapula, max SUV 4.8 --Multiple lumbosacral vertebral bodies, including L5, max SUV 6.9 --Bilateral pelvis, including the right posterior iliac bone, max SUV 6.6 Incidental CT findings: Degenerative changes of the visualized thoracolumbar spine. IMPRESSION: 2.0 cm spiculated nodule in the right lung apex, corresponding to the patient's known primary bronchogenic neoplasm. Associated thoracic nodal metastases. Dominant 2.5 cm hepatic metastasis in segment 6. Additional smaller hepatic lesions are incompletely characterized. Bilateral adrenal metastases. Multifocal osseous metastases throughout the visualized axial and  appendicular skeleton, as above. Electronically Signed   By: Julian Hy M.D.   On: 01/22/2020 08:54   DG Chest Port 1 View  Result Date: 01/14/2020 CLINICAL DATA:  Left chest port placement, history of prostate cancer EXAM: PORTABLE CHEST 1 VIEW COMPARISON:  CT chest dated 12/07/2019 FINDINGS: Known right upper lobe nodule is obscured by the right anterior 1st rib. Lungs are essentially clear.  No pleural effusion or pneumothorax. The heart is normal in size. Left chest power port terminates in the mid SVC. IMPRESSION: Left chest power port terminates in the mid SVC. Known right upper lobe nodule is obscured by the right anterior 1st rib. Electronically Signed   By: Julian Hy M.D.   On: 01/14/2020 08:23   DG C-Arm 1-60 Min-No Report  Result Date: 01/14/2020 Fluoroscopy was utilized by the requesting physician.  No radiographic interpretation.     ASSESSMENT:  1. 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 Jenetta Downer. -CT abdomen on 12/03/2019 showed posterior right lobe lesion measuring 2.1 x 1.9 cm and another anterior liver dome lesion measuring 1.3 x 1.2 cm. Right adrenal mass measuring 3.3 x 1.8 cm. -MRI of the liver on 12/05/2019 showed rim-enhancing lesion of the right adrenal gland measuring 3.0 x 2.5 cm. There is rim-enhancing lesion of the superior pole of the left kidney measuring 1.9 x 1.7 cm. Right lobe of the liver lesion measuring 2.2 x 2.0 cm. Anterior liver dome lesion measuring 1.1 x 0.9 cm. -CT chest without  contrast on 12/07/2019 showed 1.9 cm spiculated nodule in the right lung apex. Right paratracheal lymph node 1.3 cm. 1.5 cm lower right paratracheal node. -Right lobe of liver needle biopsy consistent with adenocarcinoma, positive for CK7, TTF-1. Negative for CK20, CDX2, Napsin a and CK 5/6. -PD-L1 TPS 1% -Foundation 1 with TMB high, MS-stable, no other targetable mutations.  2.  Prostate cancer: -Diagnosed in 2017, seed implants done in June 2018.  3. Social/family history: -He drives a Teacher, English as a foreign language. Quit smoking in 1992, 2 packs/day for 32 years. -Brother had prostate cancer. Sister had skin cancer and mother had throat cancer.  4.  Brain metastasis: -MRI of the brain on 12/28/2019 shows bilateral cerebellar metastasis measuring 6 to 7 mm.  Enhancing 1.2 cm left clival metastasis.   PLAN:  1. 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  Tolerated fairly well. -Had nausea and vomiting for 3 days. -Encouraged him to prophylactically take his nausea medicines before and after treatment x3 days. -Reviewed lab work-mild hyponatremia which appears to be his baseline and neutropenia with an ANC of 1.2 secondary to treatment.  We discussed neutropenic precautions.  No Stafford of infection.  He is to call clinic if he develops a fever.  2.  Brain metastasis: -He has 2 subcentimeter bilateral cerebellar metastasis and left clival bone met. -[facility] was [facility] for [facility]. -He is scheduled to have simulation tomorrow with repeat MRI of his brain today.  3.  Generalized pains: -He started having pain in the right shoulder, right upper quadrant, epigastric region and chest wall about a week ago. -Reviewed PET scan from 01/21/2020 showing many osseous metastasis.  Discussed with Lance. Delton Coombes.  Pain appears to be improving as well as his performance status.  We will continue to monitor him very closely.  If he develops significant weakness we will get an MRI of thoracic spine.  -Continue oxycodone 10 mg every 6-8 hours as needed. -Continue bowel regimen.  4.  Constipation: -We discussed with narcotics he must take stool softeners daily.  Recommend MiraLAX and Senokot.    Greater than 50% was spent in counseling and coordination of care with this patient including but not limited to  discussion of the relevant topics above (See A&P) including, but not limited to diagnosis and management of acute and chronic medical conditions.   Orders placed this encounter:  No orders of the defined types were placed in this encounter.  Faythe Casa, NP 01/24/2020 9:04 AM  [place] [phone]

## 2020-01-25 ENCOUNTER — Other Ambulatory Visit: Payer: Self-pay

## 2020-01-25 ENCOUNTER — Telehealth (HOSPITAL_COMMUNITY): Payer: Self-pay

## 2020-01-25 ENCOUNTER — Ambulatory Visit
Admission: RE | Admit: 2020-01-25 | Discharge: 2020-01-25 | Disposition: A | Discharge status: Home / Self Care | Payer: Medicare Other | Source: Ambulatory Visit | Attending: Radiation Oncology | Admitting: Radiation Oncology

## 2020-01-25 VITALS — BP 137/81 | HR 82 | Temp 97.8°F | Resp 20 | Ht 73.0 in | Wt 213.4 lb

## 2020-01-25 DIAGNOSIS — C3491 Malignant neoplasm of unspecified part of right bronchus or lung: Secondary | ICD-10-CM | POA: Insufficient documentation

## 2020-01-25 DIAGNOSIS — C787 Secondary malignant neoplasm of liver and intrahepatic bile duct: Secondary | ICD-10-CM | POA: Insufficient documentation

## 2020-01-25 DIAGNOSIS — C7931 Secondary malignant neoplasm of brain: Secondary | ICD-10-CM | POA: Diagnosis present

## 2020-01-25 DIAGNOSIS — Z510 Encounter for antineoplastic radiation therapy: Secondary | ICD-10-CM | POA: Diagnosis not present

## 2020-01-25 DIAGNOSIS — C7971 Secondary malignant neoplasm of right adrenal gland: Secondary | ICD-10-CM | POA: Diagnosis not present

## 2020-01-25 DIAGNOSIS — C7951 Secondary malignant neoplasm of bone: Secondary | ICD-10-CM

## 2020-01-25 MED ORDER — SODIUM CHLORIDE 0.9% FLUSH: 10.0000 mL | Freq: Once | INTRAVENOUS | Status: DC

## 2020-01-25 MED ORDER — HEPARIN SOD (PORK) LOCK FLUSH 100 UNIT/ML IV SOLN: 500.0000 [IU] | Freq: Once | INTRAVENOUS | Status: DC

## 2020-01-25 NOTE — Progress Notes (Signed)
Has armband been applied?  Yes.    Does patient have an allergy to IV contrast dye?: No.   Has patient ever received premedication for IV contrast dye?: No.   Does patient take metformin?: No.  If patient does take metformin when was the last dose: n/a  Date of lab work: 01/17/2020   BUN: 19 CR: 1.05  IV site: subclavian left, condition no redness  Has IV site been added to flowsheet?  Yes.    BP 137/81 (BP Location: Right Arm, Patient Position: Sitting, Cuff Size: Normal)    Pulse 82    Temp 97.8 F (36.6 C)    Resp 20    Ht 6\' 1"  (1.854 m)    Wt 213 lb 6.4 oz (96.8 kg)    SpO2 97%    BMI 28.15 kg/m

## 2020-01-25 NOTE — Progress Notes (Signed)
Accessed left subclavian power port for CT simulation. Patient tolerated well. Excellent blood return obtained. Site flushed without resistance. Saline locked while patient awaits sim.

## 2020-01-25 NOTE — Progress Notes (Signed)
Manuela Schwartz, please set-up [facility]

## 2020-01-25 NOTE — Telephone Encounter (Signed)
Nutrition  Patient identified on Malnutrition Screening report for weight loss and poor appetite.    Called and spoke with wife.  Patient currently not at home and will not be available later this afternoon as had appointment at [facility].  RD will plan to call at another time to follow-up with patient.   Wife reports patient was previously eating well before chemo, 3 eggs at breakfast, drinking ensure.  After chemo had nausea and vomiting and now will not eat eggs or drink ensure.  Has been eating mostly soups.  Wife concerned about that this is the only thing he is eating.   Saddie Sandeen B. Zenia Resides, [place], [place] Registered Dietitian [phone] (mobile)

## 2020-01-27 NOTE — Progress Notes (Signed)
  Radiation Oncology         [phone] ________________________________  Name: Lance Stafford MRN: [id number]  Date: 01/25/2020  DOB: [birth year]  SIMULATION AND TREATMENT PLANNING NOTE    ICD-10-CM   1. Brain metastases ([place])  C79.31     DIAGNOSIS:  74 yo man with 5 subcentimeter brain metastases.  NARRATIVE:  The patient was brought to the [facility].  Identity was confirmed.  All relevant records and images related to the planned course of therapy were reviewed.  The patient freely provided informed written consent to proceed with treatment after reviewing the details related to the planned course of therapy. The consent form was witnessed and verified by the simulation staff. Intravenous access was established for contrast administration. Then, the patient was set-up in a stable reproducible supine position for radiation therapy.  A relocatable thermoplastic stereotactic head frame was fabricated for precise immobilization.  CT images were obtained.  Surface markings were placed.  The CT images were loaded into the planning software and fused with the patient's targeting MRI scan.  Then the target and avoidance structures were contoured.  Treatment planning then occurred.  The radiation prescription was entered and confirmed.  I have requested 3D planning  I have requested a DVH of the following structures: Brain stem, brain, left eye, right eye, lenses, optic chiasm, target volumes, uninvolved brain, and normal tissue.    SPECIAL TREATMENT PROCEDURE:  The planned course of therapy using radiation constitutes a special treatment procedure. Special care is required in the management of this patient for the following reasons. This treatment constitutes a Special Treatment Procedure for the following reason: High dose per fraction requiring special monitoring for increased toxicities of treatment including daily imaging.  The special nature of the planned course of radiotherapy  will require increased physician supervision and oversight to ensure patient's safety with optimal treatment outcomes.  PLAN:  The patient will receive 20 Gy in 1 fraction.  ________________________________  Sheral Apley Tammi Klippel, M.D.

## 2020-01-29 DIAGNOSIS — Z510 Encounter for antineoplastic radiation therapy: Secondary | ICD-10-CM | POA: Diagnosis not present

## 2020-01-31 ENCOUNTER — Other Ambulatory Visit: Payer: Self-pay

## 2020-01-31 ENCOUNTER — Ambulatory Visit
Admission: RE | Admit: 2020-01-31 | Discharge: 2020-01-31 | Disposition: A | Discharge status: Home / Self Care | Payer: Medicare Other | Source: Ambulatory Visit | Attending: Radiation Oncology | Admitting: Radiation Oncology

## 2020-01-31 ENCOUNTER — Encounter: Payer: Self-pay | Admitting: Radiation Oncology

## 2020-01-31 VITALS — BP 132/99 | HR 81 | Temp 97.8°F | Resp 20

## 2020-01-31 DIAGNOSIS — Z510 Encounter for antineoplastic radiation therapy: Secondary | ICD-10-CM | POA: Diagnosis not present

## 2020-01-31 DIAGNOSIS — C7931 Secondary malignant neoplasm of brain: Secondary | ICD-10-CM

## 2020-01-31 NOTE — Progress Notes (Signed)
Nurse monitoring following  SRS treatment complete. Vitals stable. Patient denies development of new neurologic symptoms. Patient without complaints or needs at this time. Encouraged patient to follow up with Dr. Raliegh Ip about impending COVID vaccination and returning to church regularly. Patient verbalized understanding. Instructed patient to avoid strenuous activity for the next 24 hours. Instructed patient to call [phone] with any needs related to his treatment today. Patient verbalized understanding. Patient discharged home with a church friend. Patient exited the nursing clinic ambulatory in no distress.   BP (!) 132/99 (BP Location: Right Arm, Patient Position: Sitting, Cuff Size: Normal)   Pulse 81   Temp 97.8 F (36.6 C)   Resp 20   SpO2 99%

## 2020-02-01 ENCOUNTER — Other Ambulatory Visit (HOSPITAL_COMMUNITY): Payer: Self-pay | Admitting: *Deleted

## 2020-02-01 ENCOUNTER — Telehealth (HOSPITAL_COMMUNITY): Payer: Self-pay

## 2020-02-01 NOTE — Telephone Encounter (Signed)
Nutrition Assessment   Reason for Assessment:   Patient identified on Malnutrition Screening report for weight loss and poor appetite.    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  Past medical history of GERD, HLD, prostate cancer. Patient receiving chemotherapy carbo/keytruda/alimta and radiation [facility]) to brain metastases.  Spoke with patient via phone.  Patient reports that his appetite has been poor.  Maybe little improvement today.  Reports has been drinking 3 ensure shakes (plus) typically daily only 1 yesterday.  Reports issues with nausea.  Vomiting this am after taking nausea medications.  Has been eating cereals, fruits, soups, baked chicken, mashed potatoes, peas, eggs and toast.  Reports issues with constipation but this is improved.     Medications: zofran, prilosec, colace, lactulose   Labs: reviewed   Anthropometrics:   Height: 73 inches Weight: 213 lb 10/14 UBW: 227 lb 06/2019 BMI: 28  6% weight loss in the last 7 months, concerning   NUTRITION DIAGNOSIS: Inadequate oral intake related to cancer and cancer related treatment side effects as evidenced by 6% weight loss in 7 months and poor po intake   INTERVENTION:  Discussed strategies to help with nausea.  Will mail handout. Encouraged being proactive with taking nausea medications.   Discussed oral nutrition supplements. Encouraged 350 calorie shake or higher.   Contact information provided   MONITORING, EVALUATION, GOAL: weight trends, intake   Next Visit: Nov 19 phone f/u  Catlin Doria B. Zenia Resides, [place], [place] Solis Registered Dietitian [phone] (mobile)

## 2020-02-07 ENCOUNTER — Inpatient Hospital Stay (HOSPITAL_COMMUNITY): Payer: Medicare Other

## 2020-02-07 ENCOUNTER — Other Ambulatory Visit: Payer: Self-pay

## 2020-02-07 ENCOUNTER — Other Ambulatory Visit (HOSPITAL_COMMUNITY): Payer: Self-pay | Admitting: *Deleted

## 2020-02-07 ENCOUNTER — Inpatient Hospital Stay (HOSPITAL_BASED_OUTPATIENT_CLINIC_OR_DEPARTMENT_OTHER): Payer: Medicare Other | Admitting: Hematology

## 2020-02-07 VITALS — BP 143/87 | HR 75 | Temp 96.4°F | Resp 18 | Wt 209.8 lb

## 2020-02-07 VITALS — BP 111/71 | HR 66 | Resp 18

## 2020-02-07 DIAGNOSIS — C3491 Malignant neoplasm of unspecified part of right bronchus or lung: Secondary | ICD-10-CM

## 2020-02-07 DIAGNOSIS — C787 Secondary malignant neoplasm of liver and intrahepatic bile duct: Secondary | ICD-10-CM | POA: Diagnosis not present

## 2020-02-07 DIAGNOSIS — Z95828 Presence of other vascular implants and grafts: Secondary | ICD-10-CM

## 2020-02-07 DIAGNOSIS — C7931 Secondary malignant neoplasm of brain: Secondary | ICD-10-CM

## 2020-02-07 DIAGNOSIS — C7951 Secondary malignant neoplasm of bone: Secondary | ICD-10-CM | POA: Diagnosis not present

## 2020-02-07 DIAGNOSIS — Z5112 Encounter for antineoplastic immunotherapy: Secondary | ICD-10-CM | POA: Diagnosis not present

## 2020-02-07 LAB — CBC WITH DIFFERENTIAL/PLATELET
Abs Immature Granulocytes: 0.14 10*3/uL — ABNORMAL HIGH (ref 0.00–0.07)
Basophils Absolute: 0.1 10*3/uL (ref 0.0–0.1)
Basophils Relative: 1 %
Eosinophils Absolute: 0 10*3/uL (ref 0.0–0.5)
Eosinophils Relative: 1 %
HCT: 44.2 % (ref 39.0–52.0)
Hemoglobin: 15.3 g/dL (ref 13.0–17.0)
Immature Granulocytes: 2 %
Lymphocytes Relative: 32 %
Lymphs Abs: 1.9 10*3/uL (ref 0.7–4.0)
MCH: 31.7 pg (ref 26.0–34.0)
MCHC: 34.6 g/dL (ref 30.0–36.0)
MCV: 91.5 fL (ref 80.0–100.0)
Monocytes Absolute: 1.1 10*3/uL — ABNORMAL HIGH (ref 0.1–1.0)
Monocytes Relative: 18 %
Neutro Abs: 2.8 10*3/uL (ref 1.7–7.7)
Neutrophils Relative %: 46 %
Platelets: 353 10*3/uL (ref 150–400)
RBC: 4.83 MIL/uL (ref 4.22–5.81)
RDW: 12.8 % (ref 11.5–15.5)
WBC: 6 10*3/uL (ref 4.0–10.5)
nRBC: 0 % (ref 0.0–0.2)

## 2020-02-07 LAB — COMPREHENSIVE METABOLIC PANEL
ALT: 22 U/L (ref 0–44)
AST: 20 U/L (ref 15–41)
Albumin: 3.2 g/dL — ABNORMAL LOW (ref 3.5–5.0)
Alkaline Phosphatase: 150 U/L — ABNORMAL HIGH (ref 38–126)
Anion gap: 11 (ref 5–15)
BUN: 11 mg/dL (ref 8–23)
CO2: 25 mmol/L (ref 22–32)
Calcium: 8.5 mg/dL — ABNORMAL LOW (ref 8.9–10.3)
Chloride: 98 mmol/L (ref 98–111)
Creatinine, Ser: 0.99 mg/dL (ref 0.61–1.24)
GFR, Estimated: >60 mL/min (ref >60)
Glucose, Bld: 123 mg/dL — ABNORMAL HIGH (ref 70–99)
Potassium: 3.8 mmol/L (ref 3.5–5.1)
Sodium: 134 mmol/L — ABNORMAL LOW (ref 135–145)
Total Bilirubin: 0.6 mg/dL (ref 0.3–1.2)
Total Protein: 6.9 g/dL (ref 6.5–8.1)

## 2020-02-07 MED ORDER — SODIUM CHLORIDE 0.9 % IV SOLN
500.0000 mg/m2 | Freq: Once | INTRAVENOUS | Status: AC
  Administered 2020-02-07: 1100 mg via INTRAVENOUS
  Filled 2020-02-07: qty 40

## 2020-02-07 MED ORDER — OXYCODONE HCL 5 MG PO TABS: 10.0000 mg | ORAL_TABLET | Freq: Four times a day (QID) | ORAL | 0 refills | Status: DC | PRN

## 2020-02-07 MED ORDER — SODIUM CHLORIDE 0.9% FLUSH
10.0000 mL | INTRAVENOUS | Status: DC | PRN
  Administered 2020-02-07: 10 mL

## 2020-02-07 MED ORDER — SODIUM CHLORIDE 0.9 % IV SOLN
10.0000 mg | Freq: Once | INTRAVENOUS | Status: AC
  Administered 2020-02-07: 10 mg via INTRAVENOUS
  Filled 2020-02-07: qty 10

## 2020-02-07 MED ORDER — SODIUM CHLORIDE 0.9 % IV SOLN
150.0000 mg | Freq: Once | INTRAVENOUS | Status: AC
  Administered 2020-02-07: 150 mg via INTRAVENOUS
  Filled 2020-02-07: qty 5

## 2020-02-07 MED ORDER — PALONOSETRON HCL INJECTION 0.25 MG/5ML
0.2500 mg | Freq: Once | INTRAVENOUS | Status: AC
  Administered 2020-02-07: 0.25 mg via INTRAVENOUS
  Filled 2020-02-07: qty 5

## 2020-02-07 MED ORDER — SODIUM CHLORIDE 0.9 % IV SOLN: Freq: Once | INTRAVENOUS | Status: AC

## 2020-02-07 MED ORDER — SODIUM CHLORIDE 0.9 % IV SOLN
200.0000 mg | Freq: Once | INTRAVENOUS | Status: AC
  Administered 2020-02-07: 200 mg via INTRAVENOUS
  Filled 2020-02-07: qty 8

## 2020-02-07 MED ORDER — SODIUM CHLORIDE 0.9 % IV SOLN
590.0000 mg | Freq: Once | INTRAVENOUS | Status: AC
  Administered 2020-02-07: 590 mg via INTRAVENOUS
  Filled 2020-02-07: qty 59

## 2020-02-07 MED ORDER — HEPARIN SOD (PORK) LOCK FLUSH 100 UNIT/ML IV SOLN
500.0000 [IU] | Freq: Once | INTRAVENOUS | Status: AC | PRN
  Administered 2020-02-07: 500 [IU]

## 2020-02-07 NOTE — Progress Notes (Signed)
Treatment given today per MD orders. Tolerated infusion without adverse affects. Vital signs stable. No complaints at this time. Discharged from clinic ambulatory in stable condition. Alert and oriented x 3. F/U with [facility] as scheduled.

## 2020-02-07 NOTE — Progress Notes (Signed)
 [facility] [address] [place], [place] [address]   CLINIC:  Medical Oncology/Hematology  PCP:  Weeks, Susan R, NP [address] / [place] [address] [phone]   REASON FOR VISIT:  Follow-up for stage IV right lung adenocarcinoma  PRIOR THERAPY: SRS on 01/31/2020  NGS Results: PD-L1 TPS 1%, Foundation 1 MS--stable, TMB 10 Muts/Mb  CURRENT THERAPY: Carboplatin, pemetrexed and Keytruda every 3 weeks  BRIEF ONCOLOGIC HISTORY:  Oncology History  Malignant neoplasm of right lung (HCC)  12/20/2019 Initial Diagnosis   Adenocarcinoma of lung, stage 4, right (HCC)   12/20/2019 Cancer Staging   Staging form: Lung, AJCC 8th Edition - Clinical: Stage IVB (cT1b, cN2, pM1c) - Signed by Katragadda, Sreedhar, MD on 12/20/2019   12/25/2019 Genetic Testing   PDL1     12/31/2019 Genetic Testing   Foundation One     01/17/2020 -  Chemotherapy   The patient had palonosetron (ALOXI) injection 0.25 mg, 0.25 mg, Intravenous,  Once, 1 of 4 cycles Administration: 0.25 mg (01/17/2020) PEMEtrexed (ALIMTA) 1,100 mg in sodium chloride 0.9 % 100 mL chemo infusion, 500 mg/m2 = 1,100 mg, Intravenous,  Once, 1 of 6 cycles Administration: 1,100 mg (01/17/2020) CARBOplatin (PARAPLATIN) 590 mg in sodium chloride 0.9 % 250 mL chemo infusion, 590 mg (100 % of original dose 588.5 mg), Intravenous,  Once, 1 of 4 cycles Dose modification:   (original dose 588.5 mg, Cycle 1) Administration: 590 mg (01/17/2020) fosaprepitant (EMEND) 150 mg in sodium chloride 0.9 % 145 mL IVPB, 150 mg, Intravenous,  Once, 1 of 4 cycles Administration: 150 mg (01/17/2020) pembrolizumab (KEYTRUDA) 200 mg in sodium chloride 0.9 % 50 mL chemo infusion, 200 mg, Intravenous, Once, 1 of 6 cycles Administration: 200 mg (01/17/2020)  for chemotherapy treatment.      CANCER STAGING: Cancer Staging Malignant neoplasm of right lung (HCC) Staging form: Lung, AJCC 8th Edition - Clinical: Stage IVB (cT1b, cN2, pM1c) -  Signed by Katragadda, Sreedhar, MD on 12/20/2019   INTERVAL HISTORY:  [name], a 73 y.o. male, returns for routine follow-up and consideration for next cycle of chemotherapy. [name] was last seen on 01/14/2020.  Due for cycle #2 of carboplatin, pemetrexed and Keytruda today.   Today he is accompanied by his wife. Overall, he tells me he has been feeling okay. The pain has been well-controlled with oxycodone every 6-8 hours. He reports having nausea for 1-2 days without vomiting or diarrhea after the previous treatment; he took Compazine and Zofran. He also had N/V this morning while driving. He started his radiation on 10/21 and reports having N/V, fatigue, dizziness and numbness after the radiation treatment. He reports having bumpy rash on his chest and back for several days. He is too weak to do his ADL's outside at the moment. His appetite is good 2-3 days after getting the treatment.  He received his first Moderna vaccine.  Overall, he feels ready for next cycle of chemo today.    REVIEW OF SYSTEMS:  Review of Systems  Constitutional: Positive for appetite change (75%) and fatigue (25%).  HENT:   Positive for trouble swallowing (intermittent).   Respiratory: Positive for shortness of breath (w/ exertion).   Gastrointestinal: Positive for nausea (post-chemo) and vomiting. Negative for diarrhea.  Neurological: Positive for dizziness (post radiation), headaches and numbness (post radiation).  All other systems reviewed and are negative.   PAST MEDICAL/SURGICAL HISTORY:  Past Medical History:  Diagnosis Date  . Arthritis   . Chronic low   back pain    truck driver  . GERD (gastroesophageal reflux disease)   . Hyperlipidemia   . Lung cancer ([place])    stage IV non small cell lung ca  . Nocturia   . Port-A-Cath in place 01/10/2020  . Prostate cancer ([place]) UROLOGIST-  DR WRENN/  ONCOLOGIST-  DR MANNING   dx 02/ 2017via TRUSPbx---  Stage T1c,  Gleason 3+3,  PSA 11.9  . Wears  glasses   . Wears partial dentures    upper and lower   Past Surgical History:  Procedure Laterality Date  . CATARACT EXTRACTION W/ INTRAOCULAR LENS  IMPLANT, BILATERAL  2015  . CHOLECYSTECTOMY    . CYSTOSCOPY  09/30/2016   Procedure: CYSTOSCOPY;  Surgeon: Irine Seal, MD;  Location[facility];  Service: Urology;;  no seeds found in bladder  . PORTACATH PLACEMENT Left 01/14/2020   Procedure: INSERTION PORT-A-CATH;  Surgeon: Aviva Signs, MD;  Location: [facility];  Service: General;  Laterality: Left;  . RADIOACTIVE SEED IMPLANT N/A 09/30/2016   Procedure: RADIOACTIVE SEED IMPLANT/BRACHYTHERAPY IMPLANT, SPACE OAR;  Surgeon: Irine Seal, MD;  Location: [facility];  Service: Urology;  Laterality: N/A;  70 seeds implanted  . SPERMATOCELECTOMY Left 01/30/2015   Procedure: SPERMATOCELECTOMY;  Surgeon: Irine Seal, MD;  Location: [facility];  Service: Urology;  Laterality: Left;    SOCIAL HISTORY:  Social History   Socioeconomic History  . Marital status: Married    Spouse name: Not on file  . Number of children: 2  . Years of education: Not on file  . Highest education level: Not on file  Occupational History  . Occupation: truck Geophysicist/field seismologist  Tobacco Use  . Smoking status: Former Smoker    Packs/day: 2.00    Years: 33.00    Pack years: 66.00    Types: Cigarettes    Quit date: 01/23/1991    Years since quitting: 29.0  . Smokeless tobacco: Never Used  Vaping Use  . Vaping Use: Never used  Substance and Sexual Activity  . Alcohol use: No  . Drug use: No  . Sexual activity: Yes  Other Topics Concern  . Not on file  Social History Narrative  . Not on file   Social Determinants of Health   Financial Resource Strain: Low Risk   . Difficulty of Paying Living Expenses: Not hard at all  Food Insecurity: No Food Insecurity  . Worried About Charity fundraiser in the Last Year: Never true  . Ran Out of Food in the Last Year: Never true    Transportation Needs: No Transportation Needs  . Lack of Transportation (Medical): No  . Lack of Transportation (Non-Medical): No  Physical Activity: Sufficiently Active  . Days of Exercise per Week: 1 day  . Minutes of Exercise per Session: 150+ min  Stress: No Stress Concern Present  . Feeling of Stress : Only a little  Social Connections: Socially Integrated  . Frequency of Communication with Friends and Family: More than three times a week  . Frequency of Social Gatherings with Friends and Family: Three times a week  . Attends Religious Services: More than 4 times per year  . Active Member of Clubs or Organizations: Yes  . Attends Archivist Meetings: More than 4 times per year  . Marital Status: Married  Human resources officer Violence: Not At Risk  . Fear of Current or Ex-Partner: No  . Emotionally Abused: No  . Physically Abused: No  .  Sexually Abused: No    FAMILY HISTORY:  Family History  Problem Relation Age of Onset  . Diabetes Mother   . Stroke Mother   . Throat cancer Mother   . Skin cancer Sister   . Prostate cancer Brother   . Cancer Paternal Grandmother   . Clotting disorder Daughter     CURRENT MEDICATIONS:  Current Outpatient Medications  Medication Sig Dispense Refill  . aspirin EC 81 MG tablet Take 81 mg by mouth daily.     Marland Kitchen atorvastatin (LIPITOR) 40 MG tablet Take 40 mg by mouth daily.    Marland Kitchen CARBOPLATIN IV Inject into the vein every 21 ( twenty-one) days.    Marland Kitchen docusate sodium (COLACE) 100 MG capsule Take 200 mg by mouth daily.    . folic acid (FOLVITE) 1 MG tablet Take 1 tablet (1 mg total) by mouth daily. 90 tablet 1  . Lactulose 20 GM/30ML SOLN Take 30 ml by mouth every 3 hours until bowel movement is had; then continue taking 30 ml by mouth once daily 450 mL 2  . lidocaine-prilocaine (EMLA) cream Apply a small amount to port a cath site and cover with plastic wrap 1 hour prior to chemotherapy appointments 30 g 3  . omeprazole (PRILOSEC) 40 MG  capsule Take 40 mg by mouth daily.    Marland Kitchen oxyCODONE (OXY IR/ROXICODONE) 5 MG immediate release tablet Take 2 tablets (10 mg total) by mouth every 8 (eight) hours as needed for severe pain. (Patient taking differently: Take 10 mg by mouth every 6 (six) hours as needed for severe pain. ) 180 tablet 0  . PEMEtrexed 500 mg/m2 in sodium chloride 0.9 % 100 mL Inject 500 mg/m2 into the vein every 21 ( twenty-one) days.    . prochlorperazine (COMPAZINE) 10 MG tablet Take 1 tablet (10 mg total) by mouth every 6 (six) hours as needed (Nausea or vomiting). 30 tablet 1  . sodium chloride 0.9 % SOLN 50 mL with pembrolizumab 100 MG/4ML SOLN 2 mg/kg Inject 200 mg into the vein every 21 ( twenty-one) days.    . ondansetron (ZOFRAN) 8 MG tablet Take 8 mg by mouth 3 (three) times daily. (Patient not taking: Reported on 02/07/2020)     No current facility-administered medications for this visit.    ALLERGIES:  No Known Allergies  PHYSICAL EXAM:  Performance status (ECOG): 1 - Symptomatic but completely ambulatory  Vitals:   02/07/20 0932  BP: (!) 143/87  Pulse: 75  Resp: 18  Temp: (!) 96.4 F (35.8 C)  SpO2: 100%   Wt Readings from Last 3 Encounters:  02/07/20 209 lb 12.8 oz (95.2 kg)  01/25/20 213 lb 6.4 oz (96.8 kg)  01/24/20 213 lb 13.5 oz (97 kg)   Physical Exam Vitals reviewed.  Constitutional:      Appearance: Normal appearance.  Cardiovascular:     Rate and Rhythm: Normal rate and regular rhythm.     Pulses: Normal pulses.     Heart sounds: Normal heart sounds.  Pulmonary:     Effort: Pulmonary effort is normal.     Breath sounds: Normal breath sounds.  Chest:     Comments: Port-a-Cath in L chest Abdominal:     Palpations: Abdomen is soft. There is mass (multiple lipomas on abdomen).     Tenderness: There is no abdominal tenderness.  Neurological:     General: No focal deficit present.     Mental Status: He is alert and oriented to person, place, and time.  Psychiatric:         Mood and Affect: Mood normal.        Behavior: Behavior normal.     LABORATORY DATA:  I have reviewed the labs as listed.  CBC Latest Ref Rng & Units 02/07/2020 01/24/2020 01/17/2020  WBC 4.0 - 10.5 K/uL 6.0 2.4(L) 7.9  Hemoglobin 13.0 - 17.0 g/dL 15.3 14.2 15.5  Hematocrit 39 - 52 % 44.2 41.0 46.8  Platelets 150 - 400 K/uL 353 155 307   CMP Latest Ref Rng & Units 02/07/2020 01/24/2020 01/17/2020  Glucose 70 - 99 mg/dL 123(H) 115(H) 172(H)  BUN 8 - 23 mg/dL 11 12 19  Creatinine 0.61 - 1.24 mg/dL 0.99 0.92 1.05  Sodium 135 - 145 mmol/L 134(L) 131(L) 132(L)  Potassium 3.5 - 5.1 mmol/L 3.8 4.1 4.0  Chloride 98 - 111 mmol/L 98 93(L) 96(L)  CO2 22 - 32 mmol/L 25 28 25  Calcium 8.9 - 10.3 mg/dL 8.5(L) 8.5(L) 8.7(L)  Total Protein 6.5 - 8.1 g/dL 6.9 6.7 7.1  Total Bilirubin 0.3 - 1.2 mg/dL 0.6 0.9 0.7  Alkaline Phos 38 - 126 U/L 150(H) 157(H) 160(H)  AST 15 - 41 U/L 20 27 36  ALT 0 - 44 U/L 22 44 63(H)    DIAGNOSTIC IMAGING:  I have independently reviewed the scans and discussed with the patient. MR Brain W Wo Contrast  Result Date: 01/24/2020 CLINICAL DATA:  Brain/CNS neoplasm, surveillance. EXAM: MRI HEAD WITHOUT AND WITH CONTRAST TECHNIQUE: Multiplanar, multiecho pulse sequences of the brain and surrounding structures were obtained without and with intravenous contrast. CONTRAST:  20mL MULTIHANCE GADOBENATE DIMEGLUMINE 529 MG/ML IV SOLN COMPARISON:  12/28/2019 MRI head. FINDINGS: Brain: Lesions include: Cerebellum: 1. Unchanged minimally enhancing 6 mm medial left cerebellar lesion (10:24). 2. Unchanged minimally enhancing 7 mm right cerebellar lesion (10:34). 3.  New enhancing 2-3 mm bilateral cerebellar foci (10:33, 31). Cerebrum: 1. New 2 mm enhancing cortically based right frontal foci (10:126, 128). Linear medial right occipital enhancement (10:58) is vascular in origin and better demonstrated on prior exam. No SWI signal dropout. No midline shift, ventriculomegaly or extra-axial  fluid collection. Minimal chronic microvascular ischemic changes. Vascular: Normal flow voids. Skull and upper cervical spine: Enhancing 1.3 cm left clival lesion, slightly more conspicuous than prior exam (10:39). Sinuses/Orbits: Sequela of bilateral lens replacement. Clear paranasal sinuses and mastoid air cells. Other: None. IMPRESSION: New 2-3 mm enhancing bilateral cerebellar and right frontal cortex foci. 6-7 mm bilateral cerebellar lesions are unchanged. Slightly increased conspicuity of left clival metastasis. Electronically Signed   By: Chikanele  Emekauwa M.D.   On: 01/24/2020 16:31   NM PET Image Initial (PI) Skull Base To Thigh  Result Date: 01/22/2020 CLINICAL DATA:  Initial treatment strategy for metastatic non-small cell lung cancer. History of prostate cancer. EXAM: NUCLEAR MEDICINE PET SKULL BASE TO THIGH TECHNIQUE: 12.1 mCi F-18 FDG was injected intravenously. Full-ring PET imaging was performed from the skull base to thigh after the radiotracer. CT data was obtained and used for attenuation correction and anatomic localization. Fasting blood glucose: 131 mg/dl COMPARISON:  CT chest dated 12/07/2019. MRI abdomen dated 12/05/2019. CT abdomen dated 12/03/2019. FINDINGS: Mediastinal blood pool activity: SUV max 3.0 Liver activity: SUV max NA NECK: No hypermetabolic cervical lymphadenopathy. Incidental CT findings: none CHEST: 1.8 x 2.0 cm spiculated nodule in the right lung apex with associated pleural tail (series 3/image 86), max SUV 7.3, corresponding to the patient's known primary bronchogenic neoplasm. Associated thoracic nodal metastases, including: --16 mm short   axis high right paratracheal node (series 3/image 104), max SUV 4.9 --12 mm short axis right hilar node (series 3/image 115), max SUV 8.1 --11 mm short axis right azygoesophageal recess node (series 3/image 113), max SUV 6.3 --8 mm short axis right retrocrural node (series 3/image 163), max SUV 5.4 Incidental CT findings: Left  chest port terminates in the upper SVC. Atherosclerotic calcifications of the aortic arch. Coronary atherosclerosis of the LAD. ABDOMEN/PELVIS: Dominant 2.5 cm metastasis in segment 6 (series 3/image 183), max SUV 6.0. Additional smaller hepatic lesions are incompletely characterized (series 3/images 151, 153, 172, and 174). 2.5 cm right adrenal metastasis (series 3/image 183), max SUV 8.8. 1.4 cm left adrenal metastasis (series 3/image 183), max SUV 4.6. No abnormal hypermetabolism in the pancreas or spleen. No hypermetabolic abdominopelvic lymphadenopathy. Brachytherapy seeds in the prostate. Incidental CT findings: Prior cholecystectomy. Atherosclerotic calcifications the abdominal aorta and branch vessels. Fusiform ectasia of the infrarenal abdominal aorta, measuring up to 2.8 cm (series 3/image 219). SKELETON: Multifocal osseous metastases in the visualized axial and appendicular skeleton. For example: --Right transverse process at C7, max SUV 6.6 --Multiple ribs, including the right lateral 2nd rib, max SUV 7.5 --Sternum, max SUV 8.9 --Multiple thoracic vertebral bodies, including the right T6 vertebral body, max SUV 9.1 --Right scapula, max SUV 4.8 --Multiple lumbosacral vertebral bodies, including L5, max SUV 6.9 --Bilateral pelvis, including the right posterior iliac bone, max SUV 6.6 Incidental CT findings: Degenerative changes of the visualized thoracolumbar spine. IMPRESSION: 2.0 cm spiculated nodule in the right lung apex, corresponding to the patient's known primary bronchogenic neoplasm. Associated thoracic nodal metastases. Dominant 2.5 cm hepatic metastasis in segment 6. Additional smaller hepatic lesions are incompletely characterized. Bilateral adrenal metastases. Multifocal osseous metastases throughout the visualized axial and appendicular skeleton, as above. Electronically Signed   By: Julian Hy M.D.   On: 01/22/2020 08:54   DG Chest Port 1 View  Result Date: 01/14/2020 CLINICAL  DATA:  Left chest port placement, history of prostate cancer EXAM: PORTABLE CHEST 1 VIEW COMPARISON:  CT chest dated 12/07/2019 FINDINGS: Known right upper lobe nodule is obscured by the right anterior 1st rib. Lungs are essentially clear.  No pleural effusion or pneumothorax. The heart is normal in size. Left chest power port terminates in the mid SVC. IMPRESSION: Left chest power port terminates in the mid SVC. Known right upper lobe nodule is obscured by the right anterior 1st rib. Electronically Signed   By: Julian Hy M.D.   On: 01/14/2020 08:23   DG C-Arm 1-60 Min-No Report  Result Date: 01/14/2020 Fluoroscopy was utilized by the requesting physician.  No radiographic interpretation.     ASSESSMENT:  1. 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 Jenetta Downer. -CT abdomen on 12/03/2019 showed posterior right lobe lesion measuring 2.1 x 1.9 cm and another anterior liver dome lesion measuring 1.3 x 1.2 cm. Right adrenal mass measuring 3.3 x 1.8 cm. -MRI of the liver on 12/05/2019 showed rim-enhancing lesion of the right adrenal gland measuring 3.0 x 2.5 cm. There is rim-enhancing lesion of the superior pole of the left kidney measuring 1.9 x 1.7 cm. Right lobe of the liver lesion measuring 2.2 x 2.0 cm. Anterior liver dome lesion measuring 1.1 x 0.9 cm. -CT chest without contrast on 12/07/2019 showed 1.9 cm spiculated nodule in the right lung apex. Right paratracheal lymph node 1.3 cm. 1.5 cm lower right paratracheal node. -Right lobe of liver needle  biopsy consistent with adenocarcinoma, positive for CK7, TTF-1. Negative for CK20, CDX2, Napsin a and CK 5/6. -PD-L1 TPS 1% -Foundation 1 with TMB high, MS-stable, no other targetable mutations. -PET scan on 01/21/2020 showed 2 cm nodule in the right lung apex, thoracic nodal metastasis.  2.5 cm hepatic meta stasis.  Additional small hepatic lesions incompletely  characterized.  Bilateral adrenal meta stasis.  Multifocal bone metastasis throughout the axial and appendicular skeleton.  2. Prostate cancer: -Diagnosed in 2017, seed implants done in June 2018.  3. Social/family history: -He drives a tractor-trailer. Quit smoking in 1992, 2 packs/day for 32 years. -Brother had prostate cancer. Sister had skin cancer and mother had throat cancer.  4. Brain metastasis: -MRI of the brain on 12/28/2019 shows bilateral cerebellar metastasis measuring 6 to 7 mm. Enhancing 1.2 cm left clival metastasis. -SRS to the brain lesion on 01/25/2020.   PLAN:  1. 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 -He will start his chemotherapy on Thursday because of worsening of his pain. -We reviewed results of the PET CT scan with the patient and his wife in detail. -Reviewed labs which showed alk phos is improving at 150 although elevated.  Albumin is 3.2.  Encouraged high-protein diet. -He will proceed with cycle 2 of carboplatin, pemetrexed and pembrolizumab. -I have recommended him to take Zofran 8 mg twice daily for the next 3 days starting tomorrow. -I will see him back in 3 weeks for follow-up.  We will plan to repeat scans after cycle 3.  2. Brain metastasis: -He completed SRS to the brain lesion on 01/25/2020. -He had headaches which lasted few days after the treatment.  They have gotten better.  3.  Generalized pains: -Continue oxycodone 10 mg every 6-8 hours which is helping.   Orders placed this encounter:  No orders of the defined types were placed in this encounter.    Sreedhar Katragadda, MD [facility] [phone]   I, Daniel Khashchuk, am acting as a scribe for Dr. Sreedhar Katagadda.  I, Sreedhar Katragadda MD, have reviewed the above documentation for accuracy and completeness, and I agree with the  above.     

## 2020-02-07 NOTE — Patient Instructions (Signed)
[facility] at [facility] Discharge Instructions  Labs drawn from portacath today   Thank you for choosing [facility] at [facility] to provide your oncology and hematology care.  To afford each patient quality time with our provider, please arrive at least 15 minutes before your scheduled appointment time.   If you have a lab appointment with the Cancer Center please come in thru the Main Entrance and check in at the main information desk.  You need to re-schedule your appointment should you arrive 10 or more minutes late.  We strive to give you quality time with our providers, and arriving late affects you and other patients whose appointments are after yours.  Also, if you no show three or more times for appointments you may be dismissed from the clinic at the providers discretion.     Again, thank you for choosing [facility].  Our hope is that these requests will decrease the amount of time that you wait before being seen by our physicians.       _____________________________________________________________  Should you have questions after your visit to [facility], please contact our office at [phone] and follow the prompts.  Our office hours are 8:00 a.m. and 4:30 p.m. Monday - Friday.  Please note that voicemails left after 4:00 p.m. may not be returned until the following business day.  We are closed weekends and major holidays.  You do have access to a nurse 24-7, just call the main number to the clinic [phone] and do not press any options, hold on the line and a nurse will answer the phone.    For prescription refill requests, have your pharmacy contact our office and allow 72 hours.    Due to Covid, you will need to wear a mask upon entering the hospital. If you do not have a mask, a mask will be given to you at the Main Entrance upon arrival. For doctor visits, patients may have 1 support person age 18  or older with them. For treatment visits, patients can not have anyone with them due to social distancing guidelines and our immunocompromised population.     

## 2020-02-07 NOTE — Patient Instructions (Signed)
[facility] Discharge Instructions for Patients Receiving Chemotherapy  Today you received the following chemotherapy agents   To help prevent nausea and vomiting after your treatment, we encourage you to take your nausea medication   If you develop nausea and vomiting that is not controlled by your nausea medication, call the clinic.   BELOW ARE SYMPTOMS THAT SHOULD BE REPORTED IMMEDIATELY:  *FEVER GREATER THAN 100.5 F  *CHILLS WITH OR WITHOUT FEVER  NAUSEA AND VOMITING THAT IS NOT CONTROLLED WITH YOUR NAUSEA MEDICATION  *UNUSUAL SHORTNESS OF BREATH  *UNUSUAL BRUISING OR BLEEDING  TENDERNESS IN MOUTH AND THROAT WITH OR WITHOUT PRESENCE OF ULCERS  *URINARY PROBLEMS  *BOWEL PROBLEMS  UNUSUAL RASH Items with * indicate a potential emergency and should be followed up as soon as possible.  Feel free to call the clinic should you have any questions or concerns. The clinic phone number is [phone].  Please show the CHEMO ALERT CARD at check-in to the Emergency Department and triage nurse.   

## 2020-02-07 NOTE — Patient Instructions (Signed)
[place] at [facility] Discharge Instructions  You were seen today by Dr. Delton Coombes. He went over your recent results and scans. You received your treatment today. Start taking Compazine for 2 days in the morning and evening after your chemotherapy. Get your second COVID vaccine 2 weeks after today's treatment (November 10). Dr. Delton Coombes will see you back in 3 weeks for labs and follow up.   Thank you for choosing [place] at [facility] to provide your oncology and hematology care.  To afford each patient quality time with our provider, please arrive at least 15 minutes before your scheduled appointment time.   If you have a lab appointment with the [facility] please come in thru the Main Entrance and check in at the main information desk  You need to re-schedule your appointment should you arrive 10 or more minutes late.  We strive to give you quality time with our providers, and arriving late affects you and other patients whose appointments are after yours.  Also, if you no show three or more times for appointments you may be dismissed from the clinic at the providers discretion.     Again, thank you for choosing [facility].  Our hope is that these requests will decrease the amount of time that you wait before being seen by our physicians.       _____________________________________________________________  Should you have questions after your visit to [facility], please contact our office at [phone] between the hours of 8:00 a.m. and 4:30 p.m.  Voicemails left after 4:00 p.m. will not be returned until the following business day.  For prescription refill requests, have your pharmacy contact our office and allow 72 hours.    Cancer Center Support Programs:   > Cancer Support Group  2nd Tuesday of the month 1pm-2pm, Journey Room

## 2020-02-07 NOTE — Progress Notes (Signed)
Patient was assessed by Dr. Katragadda and labs have been reviewed.  Patient is okay to proceed with treatment today. Primary RN and pharmacy aware.   

## 2020-02-09 NOTE — Progress Notes (Signed)
  Radiation Oncology         [phone] ________________________________  Stereotactic Treatment Procedure Note  Name: Lance Stafford MRN: [id number]  Date: 01/31/2020  DOB: 06-07-1945  SPECIAL TREATMENT PROCEDURE    ICD-10-CM   1. Brain metastases ([place])  C79.31     3D TREATMENT PLANNING AND DOSIMETRY:  The patient's radiation plan was reviewed and approved by neurosurgery and radiation oncology prior to treatment.  It showed 3-dimensional radiation distributions overlaid onto the planning CT/MRI image set.  The [facility] for the target structures as well as the organs at risk were reviewed. The documentation of the 3D plan and dosimetry are filed in the radiation oncology EMR.  NARRATIVE:  Lance Stafford was brought to the TrueBeam stereotactic radiation treatment machine and placed supine on the CT couch. The head frame was applied, and the patient was set up for stereotactic radiosurgery.  Neurosurgery was present for the set-up and delivery  SIMULATION VERIFICATION:  In the couch zero-angle position, the patient underwent Exactrac imaging using the Brainlab system with orthogonal KV images.  These were carefully aligned and repeated to confirm treatment position for each of the isocenters.  The Exactrac snap film verification was repeated at each couch angle.  PROCEDURE: Lance Stafford received stereotactic radiosurgery to the following targets: Six brain metastasis targets using a single isocenter: PTV1 Rt Cerebellum 29mm PTV2 Lt Cerebellum 54mm PTV3 Lt Temporal 63mm PTV4 Lt Cerebellum 63mm PTV5 Rt Cerebellum 54mm PTV6 Rt Frontal 38mm These were treated using 6 Rapid Arc VMAT Beams to a prescription dose of 20 Gy.  ExacTrac registration was performed for each couch angle.  The 100% isodose line was prescribed with 123.5% hotspot.  6 MV X-rays were delivered in the flattening filter free beam mode.  STEREOTACTIC TREATMENT MANAGEMENT:  Following delivery, the patient was transported to  nursing in stable condition and monitored for possible acute effects.  Vital signs were recorded BP (!) 132/99 (BP Location: Right Arm, Patient Position: Sitting, Cuff Size: Normal)   Pulse 81   Temp 97.8 F (36.6 C)   Resp 20   SpO2 99% . The patient tolerated treatment without significant acute effects, and was discharged to home in stable condition.    PLAN: Follow-up in one month.  ________________________________  Sheral Apley. Tammi Klippel, M.D.

## 2020-02-13 ENCOUNTER — Telehealth (HOSPITAL_COMMUNITY): Payer: Self-pay

## 2020-02-13 ENCOUNTER — Other Ambulatory Visit (HOSPITAL_COMMUNITY): Payer: Self-pay

## 2020-02-13 MED ORDER — MISC. DEVICES MISC: 99 refills | Status: DC

## 2020-02-13 NOTE — Telephone Encounter (Signed)
Patient's wife called stating that patient has decreased strength and stamina. She is requesting prescription for home DME to be faxed to [facility] in [place]. Fax # [phone].

## 2020-02-14 ENCOUNTER — Other Ambulatory Visit: Payer: Self-pay

## 2020-02-14 ENCOUNTER — Inpatient Hospital Stay (HOSPITAL_COMMUNITY): Payer: Medicare Other | Attending: Hematology | Admitting: Oncology

## 2020-02-14 ENCOUNTER — Inpatient Hospital Stay (HOSPITAL_COMMUNITY): Payer: Medicare Other

## 2020-02-14 VITALS — BP 122/72 | HR 78 | Temp 97.0°F | Resp 17

## 2020-02-14 DIAGNOSIS — Z87891 Personal history of nicotine dependence: Secondary | ICD-10-CM | POA: Diagnosis not present

## 2020-02-14 DIAGNOSIS — Z79899 Other long term (current) drug therapy: Secondary | ICD-10-CM | POA: Insufficient documentation

## 2020-02-14 DIAGNOSIS — C7971 Secondary malignant neoplasm of right adrenal gland: Secondary | ICD-10-CM | POA: Insufficient documentation

## 2020-02-14 DIAGNOSIS — Z5112 Encounter for antineoplastic immunotherapy: Secondary | ICD-10-CM | POA: Insufficient documentation

## 2020-02-14 DIAGNOSIS — E860 Dehydration: Secondary | ICD-10-CM | POA: Insufficient documentation

## 2020-02-14 DIAGNOSIS — C787 Secondary malignant neoplasm of liver and intrahepatic bile duct: Secondary | ICD-10-CM | POA: Insufficient documentation

## 2020-02-14 DIAGNOSIS — C7931 Secondary malignant neoplasm of brain: Secondary | ICD-10-CM | POA: Insufficient documentation

## 2020-02-14 DIAGNOSIS — C3491 Malignant neoplasm of unspecified part of right bronchus or lung: Secondary | ICD-10-CM | POA: Diagnosis present

## 2020-02-14 DIAGNOSIS — Z5111 Encounter for antineoplastic chemotherapy: Secondary | ICD-10-CM | POA: Diagnosis present

## 2020-02-14 DIAGNOSIS — R112 Nausea with vomiting, unspecified: Secondary | ICD-10-CM

## 2020-02-14 DIAGNOSIS — C7951 Secondary malignant neoplasm of bone: Secondary | ICD-10-CM | POA: Insufficient documentation

## 2020-02-14 DIAGNOSIS — Z23 Encounter for immunization: Secondary | ICD-10-CM | POA: Diagnosis not present

## 2020-02-14 LAB — CBC WITH DIFFERENTIAL/PLATELET
Abs Immature Granulocytes: 0.02 10*3/uL (ref 0.00–0.07)
Basophils Absolute: 0 10*3/uL (ref 0.0–0.1)
Basophils Relative: 1 %
Eosinophils Absolute: 0 10*3/uL (ref 0.0–0.5)
Eosinophils Relative: 0 %
HCT: 43.8 % (ref 39.0–52.0)
Hemoglobin: 15.3 g/dL (ref 13.0–17.0)
Immature Granulocytes: 1 %
Lymphocytes Relative: 35 %
Lymphs Abs: 1.1 10*3/uL (ref 0.7–4.0)
MCH: 31.7 pg (ref 26.0–34.0)
MCHC: 34.9 g/dL (ref 30.0–36.0)
MCV: 90.9 fL (ref 80.0–100.0)
Monocytes Absolute: 0.3 10*3/uL (ref 0.1–1.0)
Monocytes Relative: 10 %
Neutro Abs: 1.7 10*3/uL (ref 1.7–7.7)
Neutrophils Relative %: 53 %
Platelets: 176 10*3/uL (ref 150–400)
RBC: 4.82 MIL/uL (ref 4.22–5.81)
RDW: 12.6 % (ref 11.5–15.5)
WBC: 3.2 10*3/uL — ABNORMAL LOW (ref 4.0–10.5)
nRBC: 0 % (ref 0.0–0.2)

## 2020-02-14 LAB — COMPREHENSIVE METABOLIC PANEL
ALT: 26 U/L (ref 0–44)
AST: 29 U/L (ref 15–41)
Albumin: 3.3 g/dL — ABNORMAL LOW (ref 3.5–5.0)
Alkaline Phosphatase: 132 U/L — ABNORMAL HIGH (ref 38–126)
Anion gap: 12 (ref 5–15)
BUN: 19 mg/dL (ref 8–23)
CO2: 24 mmol/L (ref 22–32)
Calcium: 8.6 mg/dL — ABNORMAL LOW (ref 8.9–10.3)
Chloride: 98 mmol/L (ref 98–111)
Creatinine, Ser: 1.11 mg/dL (ref 0.61–1.24)
GFR, Estimated: >60 mL/min (ref >60)
Glucose, Bld: 139 mg/dL — ABNORMAL HIGH (ref 70–99)
Potassium: 3.2 mmol/L — ABNORMAL LOW (ref 3.5–5.1)
Sodium: 134 mmol/L — ABNORMAL LOW (ref 135–145)
Total Bilirubin: 1.1 mg/dL (ref 0.3–1.2)
Total Protein: 7 g/dL (ref 6.5–8.1)

## 2020-02-14 LAB — MAGNESIUM: Magnesium: 1.7 mg/dL (ref 1.7–2.4)

## 2020-02-14 MED ORDER — SODIUM CHLORIDE 0.9 % IV SOLN: Freq: Once | INTRAVENOUS | Status: AC

## 2020-02-14 MED ORDER — SODIUM CHLORIDE 0.9 % IV SOLN
Freq: Once | INTRAVENOUS | Status: AC
  Filled 2020-02-14: qty 10

## 2020-02-14 MED ORDER — SODIUM CHLORIDE 0.9 % IV SOLN
10.0000 mg | Freq: Once | INTRAVENOUS | Status: AC
  Administered 2020-02-14: 10 mg via INTRAVENOUS
  Filled 2020-02-14: qty 10

## 2020-02-14 MED ORDER — SODIUM CHLORIDE 0.9 % IV SOLN
Freq: Once | INTRAVENOUS | Status: AC
  Filled 2020-02-14: qty 4

## 2020-02-14 MED ORDER — SODIUM CHLORIDE 0.9% FLUSH
10.0000 mL | Freq: Once | INTRAVENOUS | Status: AC
  Administered 2020-02-14: 10 mL

## 2020-02-14 MED ORDER — HEPARIN SOD (PORK) LOCK FLUSH 100 UNIT/ML IV SOLN
500.0000 [IU] | Freq: Once | INTRAVENOUS | Status: AC
  Administered 2020-02-14: 500 [IU] via INTRAVENOUS

## 2020-02-14 MED ORDER — ONDANSETRON 8 MG PO TBDP: 8.0000 mg | ORAL_TABLET | Freq: Three times a day (TID) | ORAL | 0 refills | Status: DC | PRN

## 2020-02-14 MED ORDER — SCOPOLAMINE 1 MG/3DAYS TD PT72: 1.0000 | MEDICATED_PATCH | TRANSDERMAL | 4 refills | Status: DC

## 2020-02-14 NOTE — Patient Instructions (Signed)
[facility] at [facility]  Discharge Instructions:   _______________________________________________________________  Thank you for choosing [facility] at [facility] to provide your oncology and hematology care.  To afford each patient quality time with our providers, please arrive at least 15 minutes before your scheduled appointment.  You need to re-schedule your appointment if you arrive 10 or more minutes late.  We strive to give you quality time with our providers, and arriving late affects you and other patients whose appointments are after yours.  Also, if you no show three or more times for appointments you may be dismissed from the clinic.  Again, thank you for choosing [facility] at [facility]. Our hope is that these requests will allow you access to exceptional care and in a timely manner. _______________________________________________________________  If you have questions after your visit, please contact our office at [phone] between the hours of 8:30 a.m. and 5:00 p.m. Voicemails left after 4:30 p.m. will not be returned until the following business day. _______________________________________________________________  For prescription refill requests, have your pharmacy contact our office. _______________________________________________________________  Recommendations made by the consultant and any test results will be sent to your referring physician. _______________________________________________________________ 

## 2020-02-14 NOTE — Progress Notes (Signed)
  Name: Lance Stafford  MRN: [id number]  Date: 01/31/2020   DOB: 06-24-1945  Stereotactic Radiosurgery Operative Note  PRE-OPERATIVE DIAGNOSIS:  Multiple Brain Metastases  POST-OPERATIVE DIAGNOSIS:  Multiple Brain Metastases  PROCEDURE:  Stereotactic Radiosurgery  SURGEON:  Judith Part, MD  NARRATIVE: The patient underwent a radiation treatment planning session in the radiation oncology simulation suite under the care of the radiation oncology physician and physicist.  I participated closely in the radiation treatment planning afterwards. The patient underwent planning CT which was fused to 3T high resolution MRI with 1 mm axial slices.  These images were fused on the planning system.  We contoured the gross target volumes and subsequently expanded this to yield the Planning Target Volume. I actively participated in the planning process.  I helped to define and review the target contours and also the contours of the optic pathway, eyes, brainstem and selected nearby organs at risk.  All the dose constraints for critical structures were reviewed and compared to AAPM Task Group 101.  The prescription dose conformity was reviewed.  I approved the plan electronically.    Accordingly, Lance Stafford was brought to the TrueBeam stereotactic radiation treatment linac and placed in the custom immobilization mask.  The patient was aligned according to the IR fiducial markers with BrainLab Exactrac, then orthogonal x-rays were used in ExacTrac with the 6DOF robotic table and the shifts were made to align the patient  Lance Stafford received stereotactic radiosurgery uneventfully.    Lesions treated:  6   Complex lesions treated:  0 (>3.5 cm, <26mm of optic path, or within the brainstem)   The detailed description of the procedure is recorded in the radiation oncology procedure note.  I was present for the duration of the procedure.  DISPOSITION:  Following delivery, the patient was transported to  nursing in stable condition and monitored for possible acute effects to be discharged to home in stable condition with follow-up in one month.  Judith Part, MD 02/14/2020 3:14 PM

## 2020-02-14 NOTE — Addendum Note (Signed)
Encounter addended by: Judith Part, MD on: 02/14/2020 3:21 PM  Actions taken: Clinical Note Signed

## 2020-02-14 NOTE — Progress Notes (Signed)
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  PRIOR THERAPY: SRS on 01/31/2020  NGS Results: PD-L1 TPS 1%, Foundation 1 MS--stable, TMB 10 Muts/Mb  CURRENT THERAPY: Carboplatin, pemetrexed and Keytruda every 3 weeks  BRIEF ONCOLOGIC HISTORY:  Oncology History  Malignant neoplasm of right lung ([place])  12/20/2019 Initial Diagnosis   Adenocarcinoma of lung, stage 4, right ([place])   12/20/2019 Cancer Staging   Staging form: Lung, AJCC 8th Edition - Clinical: Stage IVB (cT1b, cN2, pM1c) - Signed by Lance Jack, MD on 12/20/2019   12/25/2019 Genetic Testing   PDL1     12/31/2019 [facility]     01/17/2020 -  Chemotherapy   The patient had palonosetron (ALOXI) injection 0.25 mg, 0.25 mg, Intravenous,  Once, 2 of 4 cycles Administration: 0.25 mg (01/17/2020), 0.25 mg (02/07/2020) PEMEtrexed (ALIMTA) 1,100 mg in sodium chloride 0.9 % 100 mL chemo infusion, 500 mg/m2 = 1,100 mg, Intravenous,  Once, 2 of 6 cycles Administration: 1,100 mg (01/17/2020), 1,100 mg (02/07/2020) CARBOplatin (PARAPLATIN) 590 mg in sodium chloride 0.9 % 250 mL chemo infusion, 590 mg (100 % of original dose 588.5 mg), Intravenous,  Once, 2 of 4 cycles Dose modification:   (original dose 588.5 mg, Cycle 1),   (original dose 612 mg, Cycle 2) Administration: 590 mg (01/17/2020), 590 mg (02/07/2020) fosaprepitant (EMEND) 150 mg in sodium chloride 0.9 % 145 mL IVPB, 150 mg, Intravenous,  Once, 2 of 4 cycles Administration: 150 mg (01/17/2020), 150 mg (02/07/2020) pembrolizumab (KEYTRUDA) 200 mg in sodium chloride 0.9 % 50 mL chemo infusion, 200 mg, Intravenous, Once, 2 of 6 cycles Administration: 200 mg (01/17/2020), 200 mg (02/07/2020)  for chemotherapy treatment.      CANCER  STAGING: Cancer Staging Malignant neoplasm of right lung [facility]) Staging form: Lung, AJCC 8th Edition - Clinical: Stage IVB (cT1b, cN2, pM1c) - Signed by Lance Jack, MD on 12/20/2019   INTERVAL HISTORY:  [name], a 74 y.o. male, presents today as an acute add-on for nausea, vomiting and anorexia for the past 3 days. Lance Stafford was last seen on 01/14/2020.  He recently completed cycle #2 of carboplatin, pemetrexed and Keytruda.   Patient is accompanied by his wife.  He tolerated the first treatment well.  Reports that he did okay second treatment until Saturday morning when he developed some nausea.  He started taking his antiemetics but each day it progressively got worse.  He has been able to drink fluids and supplements but for the past 48 hours he has been unable to keep anything down.  He just recently started his radiation which he feels is likely contributing.  His wife states he is too weak to do anything and she is scared he will fall.  She is requesting some durable medical equipment such as walker, bedside commode and wheelchair.   REVIEW OF SYSTEMS:  Review of Systems  Constitutional: Positive for appetite change (75%) and fatigue (25%).  HENT:   Positive for trouble swallowing (intermittent).   Respiratory: Positive for shortness of breath (w/ exertion).   Gastrointestinal: Positive for nausea (post-chemo) and vomiting. Negative for diarrhea.  Neurological: Positive for dizziness (post radiation), headaches and numbness (post radiation).  All other systems reviewed and are negative.   PAST MEDICAL/SURGICAL HISTORY:  Past Medical History:  Diagnosis Date  . Arthritis   . Chronic low back pain    truck driver  . GERD (gastroesophageal reflux disease)   . Hyperlipidemia   . Lung cancer ([place])    stage IV non small cell lung ca  . Nocturia   . Port-A-Cath in place 01/10/2020  . Prostate cancer ([place]) UROLOGIST-  DR WRENN/  ONCOLOGIST-  DR MANNING   dx 02/  2017via TRUSPbx---  Stage T1c,  Gleason 3+3,  PSA 11.9  . Wears glasses   . Wears partial dentures    upper and lower   Past Surgical History:  Procedure Laterality Date  . CATARACT EXTRACTION W/ INTRAOCULAR LENS  IMPLANT, BILATERAL  2015  . CHOLECYSTECTOMY    . CYSTOSCOPY  09/30/2016   Procedure: CYSTOSCOPY;  Surgeon: Irine Seal, MD;  Location: [facility];  Service: Urology;;  no seeds found in bladder  . PORTACATH PLACEMENT Left 01/14/2020   Procedure: INSERTION PORT-A-CATH;  Surgeon: Aviva Signs, MD;  Location: [facility];  Service: General;  Laterality: Left;  . RADIOACTIVE SEED IMPLANT N/A 09/30/2016   Procedure: RADIOACTIVE SEED IMPLANT/BRACHYTHERAPY IMPLANT, SPACE OAR;  Surgeon: Irine Seal, MD;  Location: [facility];  Service: Urology;  Laterality: N/A;  70 seeds implanted  . SPERMATOCELECTOMY Left 01/30/2015   Procedure: SPERMATOCELECTOMY;  Surgeon: Irine Seal, MD;  Location: [facility];  Service: Urology;  Laterality: Left;    SOCIAL HISTORY:  Social History   Socioeconomic History  . Marital status: Married    Spouse name: Not on file  . Number of children: 2  . Years of education: Not on file  . Highest education level: Not on file  Occupational History  . Occupation: truck Geophysicist/field seismologist  Tobacco Use  . Smoking status: Former Smoker    Packs/day: 2.00    Years: 33.00    Pack years: 66.00    Types: Cigarettes    Quit date: 01/23/1991    Years since quitting: 29.0  . Smokeless tobacco: Never Used  Vaping Use  . Vaping Use: Never used  Substance and Sexual Activity  . Alcohol use: No  . Drug use: No  . Sexual activity: Yes  Other Topics Concern  . Not on file  Social History Narrative  . Not on file   Social Determinants of Health   Financial Resource Strain: Low Risk   . Difficulty of Paying Living Expenses: Not hard at all  Food Insecurity: No Food Insecurity  . Worried About Charity fundraiser in the Last Year:  Never true  . Ran Out of Food in the Last Year: Never true  Transportation Needs: No Transportation Needs  . Lack of Transportation (Medical): No  . Lack of Transportation (Non-Medical): No  Physical Activity: Sufficiently Active  . Days of Exercise per Week: 1 day  . Minutes of Exercise per Session: 150+ min  Stress: No Stress Concern Present  . Feeling of Stress : Only a little  Social Connections: Socially Integrated  . Frequency of Communication with Friends and Family: More than three times a week  . Frequency of Social Gatherings with Friends and Family: Three times a week  . Attends Religious Services: More than 4 times per year  . Active Member of Clubs or Organizations: Yes  . Attends Archivist Meetings: More than 4 times per year  . Marital Status: Married  Human resources officer Violence: Not At Risk  . Fear of Current or Ex-Partner:  No  . Emotionally Abused: No  . Physically Abused: No  . Sexually Abused: No    FAMILY HISTORY:  Family History  Problem Relation Age of Onset  . Diabetes Mother   . Stroke Mother   . Throat cancer Mother   . Skin cancer Sister   . Prostate cancer Brother   . Cancer Paternal Grandmother   . Clotting disorder Daughter     CURRENT MEDICATIONS:  Current Outpatient Medications  Medication Sig Dispense Refill  . aspirin EC 81 MG tablet Take 81 mg by mouth daily.     Marland Kitchen atorvastatin (LIPITOR) 40 MG tablet Take 40 mg by mouth daily.    Marland Kitchen CARBOPLATIN IV Inject into the vein every 21 ( twenty-one) days.    Marland Kitchen docusate sodium (COLACE) 100 MG capsule Take 200 mg by mouth daily.    . folic acid (FOLVITE) 1 MG tablet Take 1 tablet (1 mg total) by mouth daily. 90 tablet 1  . Lactulose 20 GM/30ML SOLN Take 30 ml by mouth every 3 hours until bowel movement is had; then continue taking 30 ml by mouth once daily 450 mL 2  . Misc. Devices MISC Bedside Commode  Adjustable bedside table  Shower stool (not shower chair)  Lightweight  wheelchair DX: C34.91- Right lung cancer 1 each 99  . omeprazole (PRILOSEC) 40 MG capsule Take 40 mg by mouth daily.    Marland Kitchen oxyCODONE (OXY IR/ROXICODONE) 5 MG immediate release tablet Take 2 tablets (10 mg total) by mouth every 6 (six) hours as needed for severe pain. 120 tablet 0  . PEMEtrexed 500 mg/m2 in sodium chloride 0.9 % 100 mL Inject 500 mg/m2 into the vein every 21 ( twenty-one) days.    . sodium chloride 0.9 % SOLN 50 mL with pembrolizumab 100 MG/4ML SOLN 2 mg/kg Inject 200 mg into the vein every 21 ( twenty-one) days.    Marland Kitchen lidocaine-prilocaine (EMLA) cream Apply a small amount to port a cath site and cover with plastic wrap 1 hour prior to chemotherapy appointments (Patient not taking: Reported on 02/14/2020) 30 g 3  . ondansetron (ZOFRAN ODT) 8 MG disintegrating tablet Take 1 tablet (8 mg total) by mouth every 8 (eight) hours as needed for nausea or vomiting. 20 tablet 0  . prochlorperazine (COMPAZINE) 10 MG tablet Take 1 tablet (10 mg total) by mouth every 6 (six) hours as needed (Nausea or vomiting). (Patient not taking: Reported on 02/14/2020) 30 tablet 1  . scopolamine (TRANSDERM-SCOP) 1 MG/3DAYS Place 1 patch (1.5 mg total) onto the skin every 3 (three) days. 10 patch 4   No current facility-administered medications for this visit.   Facility-Administered Medications Ordered in Other Visits  Medication Dose Route Frequency Provider Last Rate Last Admin  . sodium chloride 0.9 % 1,000 mL with potassium chloride 20 mEq, magnesium sulfate 2 g infusion   Intravenous Once Jacquelin Hawking, NP 507 mL/hr at 02/14/20 1447 New Bag at 02/14/20 1447    ALLERGIES:  No Known Allergies  PHYSICAL EXAM:  Performance status (ECOG): 1 - Symptomatic but completely ambulatory  There were no vitals filed for this visit. Wt Readings from Last 3 Encounters:  02/07/20 209 lb 12.8 oz (95.2 kg)  01/25/20 213 lb 6.4 oz (96.8 kg)  01/24/20 213 lb 13.5 oz (97 kg)   Physical Exam Vitals reviewed.   Constitutional:      Appearance: Normal appearance.  Cardiovascular:     Rate and Rhythm: Normal rate and regular rhythm.  Pulses: Normal pulses.     Heart sounds: Normal heart sounds.  Pulmonary:     Effort: Pulmonary effort is normal.     Breath sounds: Normal breath sounds.  Chest:     Comments: Port-a-Cath in L chest Abdominal:     Palpations: Abdomen is soft. There is mass (multiple lipomas on abdomen).     Tenderness: There is no abdominal tenderness.  Neurological:     General: No focal deficit present.     Mental Status: He is alert and oriented to person, place, and time.  Psychiatric:        Mood and Affect: Mood normal.        Behavior: Behavior normal.     LABORATORY DATA:  I have reviewed the labs as listed.  CBC Latest Ref Rng & Units 02/14/2020 02/07/2020 01/24/2020  WBC 4.0 - 10.5 K/uL 3.2(L) 6.0 2.4(L)  Hemoglobin 13.0 - 17.0 g/dL 15.3 15.3 14.2  Hematocrit 39 - 52 % 43.8 44.2 41.0  Platelets 150 - 400 K/uL 176 353 155   CMP Latest Ref Rng & Units 02/14/2020 02/07/2020 01/24/2020  Glucose 70 - 99 mg/dL 139(H) 123(H) 115(H)  BUN 8 - 23 mg/dL '19 11 12  ' Creatinine 0.61 - 1.24 mg/dL 1.11 0.99 0.92  Sodium 135 - 145 mmol/L 134(L) 134(L) 131(L)  Potassium 3.5 - 5.1 mmol/L 3.2(L) 3.8 4.1  Chloride 98 - 111 mmol/L 98 98 93(L)  CO2 22 - 32 mmol/L '24 25 28  ' Calcium 8.9 - 10.3 mg/dL 8.6(L) 8.5(L) 8.5(L)  Total Protein 6.5 - 8.1 g/dL 7.0 6.9 6.7  Total Bilirubin 0.3 - 1.2 mg/dL 1.1 0.6 0.9  Alkaline Phos 38 - 126 U/L 132(H) 150(H) 157(H)  AST 15 - 41 U/L '29 20 27  ' ALT 0 - 44 U/L 26 22 44    DIAGNOSTIC IMAGING:  I have independently reviewed the scans and discussed with the patient. MR Brain W Wo Contrast  Result Date: 01/24/2020 CLINICAL DATA:  Brain/CNS neoplasm, surveillance. EXAM: MRI HEAD WITHOUT AND WITH CONTRAST TECHNIQUE: Multiplanar, multiecho pulse sequences of the brain and surrounding structures were obtained without and with intravenous  contrast. CONTRAST:  7m MULTIHANCE GADOBENATE DIMEGLUMINE 529 MG/ML IV SOLN COMPARISON:  12/28/2019 MRI head. FINDINGS: Brain: Lesions include: Cerebellum: 1. Unchanged minimally enhancing 6 mm medial left cerebellar lesion (10:24). 2. Unchanged minimally enhancing 7 mm right cerebellar lesion (10:34). 3.  New enhancing 2-3 mm bilateral cerebellar foci (10:33, 31). Cerebrum: 1. New 2 mm enhancing cortically based right frontal foci (10:126, 128). Linear medial right occipital enhancement (10:58) is vascular in origin and better demonstrated on prior exam. No SWI signal dropout. No midline shift, ventriculomegaly or extra-axial fluid collection. Minimal chronic microvascular ischemic changes. Vascular: Normal flow voids. Skull and upper cervical spine: Enhancing 1.3 cm left clival lesion, slightly more conspicuous than prior exam (10:39). Sinuses/Orbits: Sequela of bilateral lens replacement. Clear paranasal sinuses and mastoid air cells. Other: None. IMPRESSION: New 2-3 mm enhancing bilateral cerebellar and right frontal cortex foci. 6-7 mm bilateral cerebellar lesions are unchanged. Slightly increased conspicuity of left clival metastasis. Electronically Signed   By: CPrimitivo GauzeM.D.   On: 01/24/2020 16:31   NM PET Image Initial (PI) Skull Base To Thigh  Result Date: 01/22/2020 CLINICAL DATA:  Initial treatment strategy for metastatic non-small cell lung cancer. History of prostate cancer. EXAM: NUCLEAR MEDICINE PET SKULL BASE TO THIGH TECHNIQUE: 12.1 mCi F-18 FDG was injected intravenously. Full-ring PET imaging was performed from the skull base  to thigh after the radiotracer. CT data was obtained and used for attenuation correction and anatomic localization. Fasting blood glucose: 131 mg/dl COMPARISON:  CT chest dated 12/07/2019. MRI abdomen dated 12/05/2019. CT abdomen dated 12/03/2019. FINDINGS: Mediastinal blood pool activity: SUV max 3.0 Liver activity: SUV max NA NECK: No hypermetabolic  cervical lymphadenopathy. Incidental CT findings: none CHEST: 1.8 x 2.0 cm spiculated nodule in the right lung apex with associated pleural tail (series 3/image 86), max SUV 7.3, corresponding to the patient's known primary bronchogenic neoplasm. Associated thoracic nodal metastases, including: --16 mm short axis high right paratracheal node (series 3/image 104), max SUV 4.9 --12 mm short axis right hilar node (series 3/image 115), max SUV 8.1 --11 mm short axis right azygoesophageal recess node (series 3/image 113), max SUV 6.3 --8 mm short axis right retrocrural node (series 3/image 163), max SUV 5.4 Incidental CT findings: Left chest port terminates in the upper SVC. Atherosclerotic calcifications of the aortic arch. Coronary atherosclerosis of the LAD. ABDOMEN/PELVIS: Dominant 2.5 cm metastasis in segment 6 (series 3/image 183), max SUV 6.0. Additional smaller hepatic lesions are incompletely characterized (series 3/images 151, 153, 172, and 174). 2.5 cm right adrenal metastasis (series 3/image 183), max SUV 8.8. 1.4 cm left adrenal metastasis (series 3/image 183), max SUV 4.6. No abnormal hypermetabolism in the pancreas or spleen. No hypermetabolic abdominopelvic lymphadenopathy. Brachytherapy seeds in the prostate. Incidental CT findings: Prior cholecystectomy. Atherosclerotic calcifications the abdominal aorta and branch vessels. Fusiform ectasia of the infrarenal abdominal aorta, measuring up to 2.8 cm (series 3/image 219). SKELETON: Multifocal osseous metastases in the visualized axial and appendicular skeleton. For example: --Right transverse process at C7, max SUV 6.6 --Multiple ribs, including the right lateral 2nd rib, max SUV 7.5 --Sternum, max SUV 8.9 --Multiple thoracic vertebral bodies, including the right T6 vertebral body, max SUV 9.1 --Right scapula, max SUV 4.8 --Multiple lumbosacral vertebral bodies, including L5, max SUV 6.9 --Bilateral pelvis, including the right posterior iliac bone, max SUV  6.6 Incidental CT findings: Degenerative changes of the visualized thoracolumbar spine. IMPRESSION: 2.0 cm spiculated nodule in the right lung apex, corresponding to the patient's known primary bronchogenic neoplasm. Associated thoracic nodal metastases. Dominant 2.5 cm hepatic metastasis in segment 6. Additional smaller hepatic lesions are incompletely characterized. Bilateral adrenal metastases. Multifocal osseous metastases throughout the visualized axial and appendicular skeleton, as above. Electronically Signed   By: Julian Hy M.D.   On: 01/22/2020 08:54     ASSESSMENT:  1. 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 Jenetta Downer. -CT abdomen on 12/03/2019 showed posterior right lobe lesion measuring 2.1 x 1.9 cm and another anterior liver dome lesion measuring 1.3 x 1.2 cm. Right adrenal mass measuring 3.3 x 1.8 cm. -MRI of the liver on 12/05/2019 showed rim-enhancing lesion of the right adrenal gland measuring 3.0 x 2.5 cm. There is rim-enhancing lesion of the superior pole of the left kidney measuring 1.9 x 1.7 cm. Right lobe of the liver lesion measuring 2.2 x 2.0 cm. Anterior liver dome lesion measuring 1.1 x 0.9 cm. -CT chest without contrast on 12/07/2019 showed 1.9 cm spiculated nodule in the right lung apex. Right paratracheal lymph node 1.3 cm. 1.5 cm lower right paratracheal node. -Right lobe of liver needle biopsy consistent with adenocarcinoma, positive for CK7, TTF-1. Negative for CK20, CDX2, Napsin a and CK 5/6. -PD-L1 TPS 1% -Foundation 1 with TMB high, MS-stable, no other targetable mutations. -PET scan on 01/21/2020 showed  2 cm nodule in the right lung apex, thoracic nodal metastasis.  2.5 cm hepatic meta stasis.  Additional small hepatic lesions incompletely characterized.  Bilateral adrenal meta stasis.  Multifocal bone metastasis throughout the axial and appendicular skeleton.  2. Prostate  cancer: -Diagnosed in 2017, seed implants done in June 2018.  3. Social/family history: -He drives a Teacher, English as a foreign language. Quit smoking in 1992, 2 packs/day for 32 years. -Brother had prostate cancer. Sister had skin cancer and mother had throat cancer.  4. Brain metastasis: -MRI of the brain on 12/28/2019 shows bilateral cerebellar metastasis measuring 6 to 7 mm. Enhancing 1.2 cm left clival metastasis. -SRS to the brain lesion on 01/25/2020.   PLAN:  1. 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 -He is having extreme nausea, vomiting and anorexia for the past several days. -Will collect labs including CBC, CMP and magnesium.  -We will go ahead and give him 1 L of normal saline, 8 mg Zofran and 10 mg dexamethasone. -We will send in a prescription for scopolamine patch and Zofran 8 mg every 8 hours as needed nausea.  Continue Compazine as needed.  2. Brain metastasis: -He completed SRS to the brain lesion on 01/25/2020. -He had headaches which lasted few days after the treatment.  They have gotten better.  3.  Generalized pains: -Continue oxycodone 10 mg every 6-8 hours which is helping.  Disposition: RTC as scheduled for cycle 3 on 02/28/2020.   Orders placed this encounter:  No orders of the defined types were placed in this encounter.  Faythe Casa, NP 02/14/2020 4:03 PM  [facility] [phone]  Greater than 50% was spent in counseling and coordination of care with this patient including but not limited to discussion of the relevant topics above (See A&P) including, but not limited to diagnosis and management of acute and chronic medical conditions. ,

## 2020-02-14 NOTE — Progress Notes (Signed)
Patient presents today for follow up visit with JBurns NP. Vital signs stable. Patient vomiting. Verbal orders received from NP to give house fluids over 2 hours, Zofran 8 mg IVPB, Decadron 10 mg IVPB.   Verbal order received from [place] NP / Janan Ridge RN may increase house fluids to 750 mls per hour.   House fluids given today per MD orders. Tolerated infusion without adverse affects. Vital signs stable. No complaints at this time. Discharged from clinic via wheel chair in  stable condition. Alert and oriented x 3. F/U with [facility] as scheduled.

## 2020-02-25 ENCOUNTER — Telehealth (HOSPITAL_COMMUNITY): Payer: Self-pay | Admitting: Surgery

## 2020-02-25 NOTE — Telephone Encounter (Signed)
Pt's wife called stating that the pt was vomiting thick yellow liquid ever since supper yesterday and that he has vomited a total of 6 times.  She stated that he has tried taking his nausea medication but he threw it back up too.    Dr. Delton Coombes was notified and wants the pt to come in tomorrow for fluids.  Also, the pt has ODT zofran that he wants the pt to take every 8 hours.    Amy scheduled the pt to come in tomorrow at 9:15 for fluids.  I called the pt's wife back and she verbalized understanding of these instructions.

## 2020-02-26 ENCOUNTER — Inpatient Hospital Stay (HOSPITAL_COMMUNITY): Payer: Medicare Other

## 2020-02-26 ENCOUNTER — Other Ambulatory Visit (HOSPITAL_COMMUNITY): Payer: Self-pay | Admitting: *Deleted

## 2020-02-26 ENCOUNTER — Encounter (HOSPITAL_COMMUNITY): Payer: Self-pay

## 2020-02-26 ENCOUNTER — Other Ambulatory Visit: Payer: Self-pay

## 2020-02-26 VITALS — BP 124/86 | HR 74 | Temp 96.7°F | Resp 17

## 2020-02-26 DIAGNOSIS — C3491 Malignant neoplasm of unspecified part of right bronchus or lung: Secondary | ICD-10-CM

## 2020-02-26 DIAGNOSIS — Z5112 Encounter for antineoplastic immunotherapy: Secondary | ICD-10-CM | POA: Diagnosis not present

## 2020-02-26 DIAGNOSIS — E860 Dehydration: Secondary | ICD-10-CM

## 2020-02-26 LAB — CBC WITH DIFFERENTIAL/PLATELET
Abs Immature Granulocytes: 0.07 10*3/uL (ref 0.00–0.07)
Basophils Absolute: 0 10*3/uL (ref 0.0–0.1)
Basophils Relative: 1 %
Eosinophils Absolute: 0 10*3/uL (ref 0.0–0.5)
Eosinophils Relative: 0 %
HCT: 40.6 % (ref 39.0–52.0)
Hemoglobin: 14 g/dL (ref 13.0–17.0)
Immature Granulocytes: 2 %
Lymphocytes Relative: 33 %
Lymphs Abs: 1.4 10*3/uL (ref 0.7–4.0)
MCH: 31.3 pg (ref 26.0–34.0)
MCHC: 34.5 g/dL (ref 30.0–36.0)
MCV: 90.6 fL (ref 80.0–100.0)
Monocytes Absolute: 1 10*3/uL (ref 0.1–1.0)
Monocytes Relative: 24 %
Neutro Abs: 1.7 10*3/uL (ref 1.7–7.7)
Neutrophils Relative %: 40 %
Platelets: 196 10*3/uL (ref 150–400)
RBC: 4.48 MIL/uL (ref 4.22–5.81)
RDW: 13.6 % (ref 11.5–15.5)
WBC: 4.2 10*3/uL (ref 4.0–10.5)
nRBC: 0 % (ref 0.0–0.2)

## 2020-02-26 LAB — COMPREHENSIVE METABOLIC PANEL
ALT: 21 U/L (ref 0–44)
AST: 13 U/L — ABNORMAL LOW (ref 15–41)
Albumin: 2.9 g/dL — ABNORMAL LOW (ref 3.5–5.0)
Alkaline Phosphatase: 115 U/L (ref 38–126)
Anion gap: 10 (ref 5–15)
BUN: 15 mg/dL (ref 8–23)
CO2: 28 mmol/L (ref 22–32)
Calcium: 8.3 mg/dL — ABNORMAL LOW (ref 8.9–10.3)
Chloride: 96 mmol/L — ABNORMAL LOW (ref 98–111)
Creatinine, Ser: 1.21 mg/dL (ref 0.61–1.24)
GFR, Estimated: >60 mL/min (ref >60)
Glucose, Bld: 115 mg/dL — ABNORMAL HIGH (ref 70–99)
Potassium: 3.6 mmol/L (ref 3.5–5.1)
Sodium: 134 mmol/L — ABNORMAL LOW (ref 135–145)
Total Bilirubin: 1 mg/dL (ref 0.3–1.2)
Total Protein: 6.3 g/dL — ABNORMAL LOW (ref 6.5–8.1)

## 2020-02-26 LAB — MAGNESIUM: Magnesium: 1.7 mg/dL (ref 1.7–2.4)

## 2020-02-26 MED ORDER — HEPARIN SOD (PORK) LOCK FLUSH 100 UNIT/ML IV SOLN
500.0000 [IU] | Freq: Once | INTRAVENOUS | Status: AC | PRN
  Administered 2020-02-26: 500 [IU]

## 2020-02-26 MED ORDER — SODIUM CHLORIDE 0.9 % IV SOLN
Freq: Once | INTRAVENOUS | Status: AC
  Filled 2020-02-26: qty 10

## 2020-02-26 MED ORDER — OXYCODONE HCL 5 MG PO TABS: 10.0000 mg | ORAL_TABLET | Freq: Four times a day (QID) | ORAL | 0 refills | Status: DC | PRN

## 2020-02-26 MED ORDER — SODIUM CHLORIDE 0.9 % IV SOLN
Freq: Once | INTRAVENOUS | Status: AC
  Filled 2020-02-26: qty 4

## 2020-02-26 MED ORDER — SODIUM CHLORIDE 0.9 % IV SOLN
10.0000 mg | Freq: Once | INTRAVENOUS | Status: AC
  Administered 2020-02-26: 10 mg via INTRAVENOUS
  Filled 2020-02-26: qty 10

## 2020-02-26 MED ORDER — DEXAMETHASONE 2 MG PO TABS: 2.0000 mg | ORAL_TABLET | Freq: Every day | ORAL | 1 refills | Status: DC

## 2020-02-26 MED ORDER — PROMETHAZINE HCL 25 MG RE SUPP: 25.0000 mg | Freq: Four times a day (QID) | RECTAL | 0 refills | Status: DC | PRN

## 2020-02-26 NOTE — Patient Instructions (Signed)
[place] at [facility] Discharge Instructions  You received IV hydration today. DISCONTINUE Compazine use. STOP taking Zofran dissolvable tablet while S[place] patch is in place (apply this patch to your skin tomorrow). This patch is good to leave in place for 7 days. Prescription sent to your pharmacy for Phenergan (promethazine) 25 mg - you may take one tablet every 6 hours as needed for nausea. Prescription sent to your pharmacy for Decadron (dexamethasone) 2 mg - take one tablet every morning with breakfast. Return to clinic as scheduled on Thursday, 02/28/2020 as scheduled.     Thank you for choosing [place] at [facility] to provide your oncology and hematology care.  To afford each patient quality time with our provider, please arrive at least 15 minutes before your scheduled appointment time.   If you have a lab appointment with the [facility] please come in thru the Main Entrance and check in at the main information desk.  You need to re-schedule your appointment should you arrive 10 or more minutes late.  We strive to give you quality time with our providers, and arriving late affects you and other patients whose appointments are after yours.  Also, if you no show three or more times for appointments you may be dismissed from the clinic at the providers discretion.     Again, thank you for choosing [facility].  Our hope is that these requests will decrease the amount of time that you wait before being seen by our physicians.       _____________________________________________________________  Should you have questions after your visit to [facility], please contact our office at [phone] and follow the prompts.  Our office hours are 8:00 a.m. and 4:30 p.m. Monday - Friday.  Please note that voicemails left after 4:00 p.m. may not be returned until the following business day.  We are closed weekends  and major holidays.  You do have access to a nurse 24-7, just call the main number to the clinic [phone] and do not press any options, hold on the line and a nurse will answer the phone.    For prescription refill requests, have your pharmacy contact our office and allow 72 hours.    Due to Covid, you will need to wear a mask upon entering the hospital. If you do not have a mask, a mask will be given to you at the Main Entrance upon arrival. For doctor visits, patients may have 1 support person age 83 or older with them. For treatment visits, patients can not have anyone with them due to social distancing guidelines and our immunocompromised population.

## 2020-02-26 NOTE — Progress Notes (Signed)
Presents today c/o n/v and fatigue x 3 days.  Reports he was feeling really well except for the past few days where he has experienced n/v and unable to keep down po food/fluids.  Recently sent Rx for scopolamine patches to add to his anti-nausea home medication regimen, but he tells me it was denied coverage by his insurance.  He reports he has been using Zofran ODT as needed for nausea with some relief.  States the Compazine we prescribed in the past for him has not really helped control his nausea when he tried it.  Dr. Delton Coombes made aware of the above - orders rec'd to give IVF w/electrolytes 1 L over 2 hours.  Orders also rec'd to give Zofran 8 mg IV and dexamethasone 8 mg IV.  Pt provided Sancuso sample patch from Dr. Delton Coombes, with instructions to place the patch tomorrow and to discontinue use of Zofran while the patch is in place (7 days).  Compazine Rx discontinued and Rx Phenergan 25 mg q 6 h prn n/v sent, as well as Rx dexamethasone 2 mg po daily with breakfast.  Pt instructed of all of the above and verbalizes understanding of instructions given.    Tolerated infusion w/o adverse reaction.  Alert, in no distress - reports feeling "much better" after IV hydration.  Discharged via wheelchair in c/o spouse in stable condition.

## 2020-02-28 ENCOUNTER — Inpatient Hospital Stay (HOSPITAL_COMMUNITY): Payer: Medicare Other

## 2020-02-28 ENCOUNTER — Inpatient Hospital Stay (HOSPITAL_BASED_OUTPATIENT_CLINIC_OR_DEPARTMENT_OTHER): Payer: Medicare Other | Admitting: Hematology

## 2020-02-28 ENCOUNTER — Other Ambulatory Visit: Payer: Self-pay

## 2020-02-28 VITALS — BP 124/87 | HR 93 | Temp 97.2°F | Resp 18 | Wt 199.4 lb

## 2020-02-28 VITALS — BP 108/66 | HR 72 | Temp 97.5°F | Resp 18

## 2020-02-28 DIAGNOSIS — Z95828 Presence of other vascular implants and grafts: Secondary | ICD-10-CM

## 2020-02-28 DIAGNOSIS — C3491 Malignant neoplasm of unspecified part of right bronchus or lung: Secondary | ICD-10-CM

## 2020-02-28 DIAGNOSIS — C787 Secondary malignant neoplasm of liver and intrahepatic bile duct: Secondary | ICD-10-CM | POA: Diagnosis not present

## 2020-02-28 DIAGNOSIS — Z5112 Encounter for antineoplastic immunotherapy: Secondary | ICD-10-CM | POA: Diagnosis not present

## 2020-02-28 LAB — CBC WITH DIFFERENTIAL/PLATELET
Abs Immature Granulocytes: 0.16 10*3/uL — ABNORMAL HIGH (ref 0.00–0.07)
Basophils Absolute: 0.1 10*3/uL (ref 0.0–0.1)
Basophils Relative: 1 %
Eosinophils Absolute: 0 10*3/uL (ref 0.0–0.5)
Eosinophils Relative: 0 %
HCT: 39 % (ref 39.0–52.0)
Hemoglobin: 13.5 g/dL (ref 13.0–17.0)
Immature Granulocytes: 3 %
Lymphocytes Relative: 36 %
Lymphs Abs: 2 10*3/uL (ref 0.7–4.0)
MCH: 31.6 pg (ref 26.0–34.0)
MCHC: 34.6 g/dL (ref 30.0–36.0)
MCV: 91.3 fL (ref 80.0–100.0)
Monocytes Absolute: 1.2 10*3/uL — ABNORMAL HIGH (ref 0.1–1.0)
Monocytes Relative: 21 %
Neutro Abs: 2.2 10*3/uL (ref 1.7–7.7)
Neutrophils Relative %: 39 %
Platelets: 275 10*3/uL (ref 150–400)
RBC: 4.27 MIL/uL (ref 4.22–5.81)
RDW: 14.2 % (ref 11.5–15.5)
WBC: 5.6 10*3/uL (ref 4.0–10.5)
nRBC: 0 % (ref 0.0–0.2)

## 2020-02-28 LAB — COMPREHENSIVE METABOLIC PANEL
ALT: 19 U/L (ref 0–44)
AST: 22 U/L (ref 15–41)
Albumin: 2.9 g/dL — ABNORMAL LOW (ref 3.5–5.0)
Alkaline Phosphatase: 114 U/L (ref 38–126)
Anion gap: 9 (ref 5–15)
BUN: 11 mg/dL (ref 8–23)
CO2: 25 mmol/L (ref 22–32)
Calcium: 8.3 mg/dL — ABNORMAL LOW (ref 8.9–10.3)
Chloride: 99 mmol/L (ref 98–111)
Creatinine, Ser: 0.91 mg/dL (ref 0.61–1.24)
GFR, Estimated: >60 mL/min (ref >60)
Glucose, Bld: 118 mg/dL — ABNORMAL HIGH (ref 70–99)
Potassium: 3.3 mmol/L — ABNORMAL LOW (ref 3.5–5.1)
Sodium: 133 mmol/L — ABNORMAL LOW (ref 135–145)
Total Bilirubin: 0.6 mg/dL (ref 0.3–1.2)
Total Protein: 6.4 g/dL — ABNORMAL LOW (ref 6.5–8.1)

## 2020-02-28 LAB — TSH: TSH: 1.686 u[IU]/mL (ref 0.350–4.500)

## 2020-02-28 MED ORDER — SODIUM CHLORIDE 0.9 % IV SOLN
10.0000 mg | Freq: Once | INTRAVENOUS | Status: AC
  Administered 2020-02-28: 10 mg via INTRAVENOUS
  Filled 2020-02-28: qty 10

## 2020-02-28 MED ORDER — SODIUM CHLORIDE 0.9% FLUSH
10.0000 mL | INTRAVENOUS | Status: DC | PRN
  Administered 2020-02-28: 10 mL

## 2020-02-28 MED ORDER — INFLUENZA VAC A&B SA ADJ QUAD 0.5 ML IM PRSY
0.5000 mL | PREFILLED_SYRINGE | Freq: Once | INTRAMUSCULAR | Status: AC
  Administered 2020-02-28: 0.5 mL via INTRAMUSCULAR
  Filled 2020-02-28: qty 0.5

## 2020-02-28 MED ORDER — SODIUM CHLORIDE 0.9 % IV SOLN
500.0000 mg/m2 | Freq: Once | INTRAVENOUS | Status: AC
  Administered 2020-02-28: 1100 mg via INTRAVENOUS
  Filled 2020-02-28: qty 40

## 2020-02-28 MED ORDER — SODIUM CHLORIDE 0.9 % IV SOLN: Freq: Once | INTRAVENOUS | Status: AC

## 2020-02-28 MED ORDER — PALONOSETRON HCL INJECTION 0.25 MG/5ML
0.2500 mg | Freq: Once | INTRAVENOUS | Status: AC
  Administered 2020-02-28: 0.25 mg via INTRAVENOUS

## 2020-02-28 MED ORDER — PROMETHAZINE HCL 25 MG PO TABS: 25.0000 mg | ORAL_TABLET | Freq: Four times a day (QID) | ORAL | 2 refills | Status: DC | PRN

## 2020-02-28 MED ORDER — SODIUM CHLORIDE 0.9 % IV SOLN
200.0000 mg | Freq: Once | INTRAVENOUS | Status: AC
  Administered 2020-02-28: 200 mg via INTRAVENOUS
  Filled 2020-02-28: qty 8

## 2020-02-28 MED ORDER — SODIUM CHLORIDE 0.9 % IV SOLN
590.0000 mg | Freq: Once | INTRAVENOUS | Status: AC
  Administered 2020-02-28: 590 mg via INTRAVENOUS
  Filled 2020-02-28: qty 59

## 2020-02-28 MED ORDER — SODIUM CHLORIDE 0.9 % IV SOLN
150.0000 mg | Freq: Once | INTRAVENOUS | Status: AC
  Administered 2020-02-28: 150 mg via INTRAVENOUS
  Filled 2020-02-28: qty 5

## 2020-02-28 MED ORDER — HEPARIN SOD (PORK) LOCK FLUSH 100 UNIT/ML IV SOLN
500.0000 [IU] | Freq: Once | INTRAVENOUS | Status: AC | PRN
  Administered 2020-02-28: 500 [IU]

## 2020-02-28 MED ORDER — PALONOSETRON HCL INJECTION 0.25 MG/5ML
INTRAVENOUS | Status: AC
  Filled 2020-02-28: qty 5

## 2020-02-28 MED ORDER — CYANOCOBALAMIN 1000 MCG/ML IJ SOLN
1000.0000 ug | Freq: Once | INTRAMUSCULAR | Status: AC
  Administered 2020-02-28: 1000 ug via INTRAMUSCULAR
  Filled 2020-02-28: qty 1

## 2020-02-28 NOTE — Progress Notes (Signed)
Patient has been assessed and labs reviewed by Dr. Delton Coombes. Ok to proceed with treatment per Dr. Delton Coombes. Primary RN and Pharmacy aware.

## 2020-02-28 NOTE — Patient Instructions (Signed)
[facility] Discharge Instructions for Patients Receiving Chemotherapy  Today you received the following chemotherapy agents   To help prevent nausea and vomiting after your treatment, we encourage you to take your nausea medication   If you develop nausea and vomiting that is not controlled by your nausea medication, call the clinic.   BELOW ARE SYMPTOMS THAT SHOULD BE REPORTED IMMEDIATELY:  *FEVER GREATER THAN 100.5 F  *CHILLS WITH OR WITHOUT FEVER  NAUSEA AND VOMITING THAT IS NOT CONTROLLED WITH YOUR NAUSEA MEDICATION  *UNUSUAL SHORTNESS OF BREATH  *UNUSUAL BRUISING OR BLEEDING  TENDERNESS IN MOUTH AND THROAT WITH OR WITHOUT PRESENCE OF ULCERS  *URINARY PROBLEMS  *BOWEL PROBLEMS  UNUSUAL RASH Items with * indicate a potential emergency and should be followed up as soon as possible.  Feel free to call the clinic should you have any questions or concerns. The clinic phone number is [phone].  Please show the CHEMO ALERT CARD at check-in to the Emergency Department and triage nurse.   

## 2020-02-28 NOTE — Progress Notes (Signed)
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  PRIOR THERAPY: SRS on 01/31/2020  NGS Results: PD-L1 TPS 1%, Foundation 1 MS--stable, TMB 10 Muts/Mb  CURRENT THERAPY: Carboplatin, pemetrexed and Keytruda every 3 weeks  BRIEF ONCOLOGIC HISTORY:  Oncology History  Malignant neoplasm of right lung ([place])  12/20/2019 Initial Diagnosis   Adenocarcinoma of lung, stage 4, right ([place])   12/20/2019 Cancer Staging   Staging form: Lung, AJCC 8th Edition - Clinical: Stage IVB (cT1b, cN2, pM1c) - Signed by [name], MD on 12/20/2019   12/25/2019 Genetic Testing   PDL1     12/31/2019 [place] [facility]     01/17/2020 -  Chemotherapy   The patient had palonosetron (ALOXI) injection 0.25 mg, 0.25 mg, Intravenous,  Once, 2 of 4 cycles Administration: 0.25 mg (01/17/2020), 0.25 mg (02/07/2020) PEMEtrexed (ALIMTA) 1,100 mg in sodium chloride 0.9 % 100 mL chemo infusion, 500 mg/m2 = 1,100 mg, Intravenous,  Once, 2 of 6 cycles Administration: 1,100 mg (01/17/2020), 1,100 mg (02/07/2020) CARBOplatin (PARAPLATIN) 590 mg in sodium chloride 0.9 % 250 mL chemo infusion, 590 mg (100 % of original dose 588.5 mg), Intravenous,  Once, 2 of 4 cycles Dose modification:   (original dose 588.5 mg, Cycle 1),   (original dose 612 mg, Cycle 3),   (original dose 612 mg, Cycle 2) Administration: 590 mg (01/17/2020), 590 mg (02/07/2020) fosaprepitant (EMEND) 150 mg in sodium chloride 0.9 % 145 mL IVPB, 150 mg, Intravenous,  Once, 2 of 4 cycles Administration: 150 mg (01/17/2020), 150 mg (02/07/2020) pembrolizumab (KEYTRUDA) 200 mg in sodium chloride 0.9 % 50 mL chemo infusion, 200 mg, Intravenous, Once, 2 of 6 cycles Administration: 200 mg (01/17/2020), 200 mg (02/07/2020)  for  chemotherapy treatment.      CANCER STAGING: Cancer Staging Malignant neoplasm of right lung [facility]) Staging form: Lung, AJCC 8th Edition - Clinical: Stage IVB (cT1b, cN2, pM1c) - Signed by [name], MD on 12/20/2019   INTERVAL HISTORY:  [name], a 74 y.o. male, returns for routine follow-up and consideration for next cycle of chemotherapy. Lance Stafford was last seen on 02/07/2020.  Due for cycle #3 of carboplatin, pemetrexed and Keytruda today.   Today he is accompanied by his wife. Overall, he tells me he has been feeling okay. He has not started using the Sancuso patch. He reports having spontaneous vomiting 2-3 days after his last chemo without preceding nausea. He reports that he took Zofran, but he still threw up 30 minutes later. He reports having a mild headache after taking Zofran but denies blurry vision or vision changes. His energy levels are dropping but his appetite is okay until he becomes nauseous. He complains of pain across his lower anterior ribs and to his back, which has improved since starting chemo. He takes stool softener 2 tablets BID.  He is ambulating with a walker to prevent falls due to his weakness.  Overall, he feels ready for next cycle of chemo today.    REVIEW OF SYSTEMS:  Review of Systems  Constitutional: Positive for appetite change (75%) and fatigue (50%).  Eyes: Negative for eye problems.  Respiratory: Positive for shortness of breath.   Cardiovascular: Positive for chest pain (anterior lower ribs).  Gastrointestinal: Positive for nausea and vomiting.  Musculoskeletal:  Positive for back pain (4/10 back and lower ribs pain).  Neurological: Positive for headaches.  All other systems reviewed and are negative.   PAST MEDICAL/SURGICAL HISTORY:  Past Medical History:  Diagnosis Date  . Arthritis   . Chronic low back pain    truck driver  . GERD (gastroesophageal reflux disease)   . Hyperlipidemia   . Lung cancer ([place])     stage IV non small cell lung ca  . Nocturia   . Port-A-Cath in place 01/10/2020  . Prostate cancer ([place]) UROLOGIST-  DR WRENN/  ONCOLOGIST-  DR MANNING   dx 02/ 2017via TRUSPbx---  Stage T1c,  Gleason 3+3,  PSA 11.9  . Wears glasses   . Wears partial dentures    upper and lower   Past Surgical History:  Procedure Laterality Date  . CATARACT EXTRACTION W/ INTRAOCULAR LENS  IMPLANT, BILATERAL  2015  . CHOLECYSTECTOMY    . CYSTOSCOPY  09/30/2016   Procedure: CYSTOSCOPY;  Surgeon: Irine Seal, MD;  Location: [facility];  Service: Urology;;  no seeds found in bladder  . PORTACATH PLACEMENT Left 01/14/2020   Procedure: INSERTION PORT-A-CATH;  Surgeon: Aviva Signs, MD;  Location: [facility];  Service: General;  Laterality: Left;  . RADIOACTIVE SEED IMPLANT N/A 09/30/2016   Procedure: RADIOACTIVE SEED IMPLANT/BRACHYTHERAPY IMPLANT, SPACE OAR;  Surgeon: Irine Seal, MD;  Location[facility];  Service: Urology;  Laterality: N/A;  70 seeds implanted  . SPERMATOCELECTOMY Left 01/30/2015   Procedure: SPERMATOCELECTOMY;  Surgeon: Irine Seal, MD;  Location: [facility];  Service: Urology;  Laterality: Left;    SOCIAL HISTORY:  Social History   Socioeconomic History  . Marital status: Married    Spouse name: Not on file  . Number of children: 2  . Years of education: Not on file  . Highest education level: Not on file  Occupational History  . Occupation: truck Geophysicist/field seismologist  Tobacco Use  . Smoking status: Former Smoker    Packs/day: 2.00    Years: 33.00    Pack years: 66.00    Types: Cigarettes    Quit date: 01/23/1991    Years since quitting: 29.1  . Smokeless tobacco: Never Used  Vaping Use  . Vaping Use: Never used  Substance and Sexual Activity  . Alcohol use: No  . Drug use: No  . Sexual activity: Yes  Other Topics Concern  . Not on file  Social History Narrative  . Not on file   Social Determinants of Health   Financial Resource Strain:  Low Risk   . Difficulty of Paying Living Expenses: Not hard at all  Food Insecurity: No Food Insecurity  . Worried About Charity fundraiser in the Last Year: Never true  . Ran Out of Food in the Last Year: Never true  Transportation Needs: No Transportation Needs  . Lack of Transportation (Medical): No  . Lack of Transportation (Non-Medical): No  Physical Activity: Sufficiently Active  . Days of Exercise per Week: 1 day  . Minutes of Exercise per Session: 150+ min  Stress: No Stress Concern Present  . Feeling of Stress : Only a little  Social Connections: Socially Integrated  . Frequency of Communication with Friends and Family: More than three times a week  . Frequency of Social Gatherings with Friends and Family: Three times a week  . Attends Religious Services: More than 4 times per year  . Active Member of Clubs or Organizations: Yes  . Attends  Club or Organization Meetings: More than 4 times per year  . Marital Status: Married  Human resources officer Violence: Not At Risk  . Fear of Current or Ex-Partner: No  . Emotionally Abused: No  . Physically Abused: No  . Sexually Abused: No    FAMILY HISTORY:  Family History  Problem Relation Age of Onset  . Diabetes Mother   . Stroke Mother   . Throat cancer Mother   . Skin cancer Sister   . Prostate cancer Brother   . Cancer Paternal Grandmother   . Clotting disorder Daughter     CURRENT MEDICATIONS:  Current Outpatient Medications  Medication Sig Dispense Refill  . aspirin EC 81 MG tablet Take 81 mg by mouth daily.     Marland Kitchen atorvastatin (LIPITOR) 40 MG tablet Take 40 mg by mouth daily.    Marland Kitchen CARBOPLATIN IV Inject into the vein every 21 ( twenty-one) days.    Marland Kitchen dexamethasone (DECADRON) 2 MG tablet Take 1 tablet (2 mg total) by mouth daily with breakfast. 30 tablet 1  . docusate sodium (COLACE) 100 MG capsule Take 200 mg by mouth daily.    . folic acid (FOLVITE) 1 MG tablet Take 1 tablet (1 mg total) by mouth daily. 90 tablet 1  .  Lactulose 20 GM/30ML SOLN Take 30 ml by mouth every 3 hours until bowel movement is had; then continue taking 30 ml by mouth once daily 450 mL 2  . lidocaine-prilocaine (EMLA) cream Apply a small amount to port a cath site and cover with plastic wrap 1 hour prior to chemotherapy appointments 30 g 3  . Misc. Devices MISC Bedside Commode  Adjustable bedside table  Shower stool (not shower chair)  Lightweight wheelchair DX: C34.91- Right lung cancer 1 each 99  . omeprazole (PRILOSEC) 40 MG capsule Take 40 mg by mouth daily.    . ondansetron (ZOFRAN ODT) 8 MG disintegrating tablet Take 1 tablet (8 mg total) by mouth every 8 (eight) hours as needed for nausea or vomiting. 20 tablet 0  . oxyCODONE (OXY IR/ROXICODONE) 5 MG immediate release tablet Take 2 tablets (10 mg total) by mouth every 6 (six) hours as needed for severe pain. 120 tablet 0  . PEMEtrexed 500 mg/m2 in sodium chloride 0.9 % 100 mL Inject 500 mg/m2 into the vein every 21 ( twenty-one) days.    . promethazine (PHENERGAN) 25 MG suppository Place 1 suppository (25 mg total) rectally every 6 (six) hours as needed for nausea or vomiting. 12 each 0  . sodium chloride 0.9 % SOLN 50 mL with pembrolizumab 100 MG/4ML SOLN 2 mg/kg Inject 200 mg into the vein every 21 ( twenty-one) days.     No current facility-administered medications for this visit.    ALLERGIES:  No Known Allergies  PHYSICAL EXAM:  Performance status (ECOG): 1 - Symptomatic but completely ambulatory  Vitals:   02/28/20 0814  BP: 124/87  Pulse: 93  Resp: 18  Temp: (!) 97.2 F (36.2 C)  SpO2: 92%   Wt Readings from Last 3 Encounters:  02/28/20 199 lb 6.4 oz (90.4 kg)  02/07/20 209 lb 12.8 oz (95.2 kg)  01/25/20 213 lb 6.4 oz (96.8 kg)   Physical Exam Vitals reviewed.  Constitutional:      Appearance: Normal appearance.  Cardiovascular:     Rate and Rhythm: Normal rate and regular rhythm.     Pulses: Normal pulses.     Heart sounds: Normal heart sounds.   Pulmonary:  Effort: Pulmonary effort is normal.     Breath sounds: Normal breath sounds.  Chest:     Comments: Port-a-Cath in L chest Neurological:     General: No focal deficit present.     Mental Status: He is alert and oriented to person, place, and time.  Psychiatric:        Mood and Affect: Mood normal.        Behavior: Behavior normal.     LABORATORY DATA:  I have reviewed the labs as listed.  CBC Latest Ref Rng & Units 02/28/2020 02/26/2020 02/14/2020  WBC 4.0 - 10.5 K/uL 5.6 4.2 3.2(L)  Hemoglobin 13.0 - 17.0 g/dL 13.5 14.0 15.3  Hematocrit 39 - 52 % 39.0 40.6 43.8  Platelets 150 - 400 K/uL 275 196 176   CMP Latest Ref Rng & Units 02/28/2020 02/26/2020 02/14/2020  Glucose 70 - 99 mg/dL 118(H) 115(H) 139(H)  BUN 8 - 23 mg/dL '11 15 19  ' Creatinine 0.61 - 1.24 mg/dL 0.91 1.21 1.11  Sodium 135 - 145 mmol/L 133(L) 134(L) 134(L)  Potassium 3.5 - 5.1 mmol/L 3.3(L) 3.6 3.2(L)  Chloride 98 - 111 mmol/L 99 96(L) 98  CO2 22 - 32 mmol/L '25 28 24  ' Calcium 8.9 - 10.3 mg/dL 8.3(L) 8.3(L) 8.6(L)  Total Protein 6.5 - 8.1 g/dL 6.4(L) 6.3(L) 7.0  Total Bilirubin 0.3 - 1.2 mg/dL 0.6 1.0 1.1  Alkaline Phos 38 - 126 U/L 114 115 132(H)  AST 15 - 41 U/L 22 13(L) 29  ALT 0 - 44 U/L '19 21 26    ' DIAGNOSTIC IMAGING:  I have independently reviewed the scans and discussed with the patient. No results found.   ASSESSMENT:  1. 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 Jenetta Downer. -CT abdomen on 12/03/2019 showed posterior right lobe lesion measuring 2.1 x 1.9 cm and another anterior liver dome lesion measuring 1.3 x 1.2 cm. Right adrenal mass measuring 3.3 x 1.8 cm. -MRI of the liver on 12/05/2019 showed rim-enhancing lesion of the right adrenal gland measuring 3.0 x 2.5 cm. There is rim-enhancing lesion of the superior pole of the left kidney measuring 1.9 x 1.7 cm. Right lobe of the liver lesion measuring 2.2 x  2.0 cm. Anterior liver dome lesion measuring 1.1 x 0.9 cm. -CT chest without contrast on 12/07/2019 showed 1.9 cm spiculated nodule in the right lung apex. Right paratracheal lymph node 1.3 cm. 1.5 cm lower right paratracheal node. -Right lobe of liver needle biopsy consistent with adenocarcinoma, positive for CK7, TTF-1. Negative for CK20, CDX2, Napsin a and CK 5/6. -PD-L1 TPS 1% -Foundation 1 with TMB high, MS-stable, no other targetable mutations. -PET scan on 01/21/2020 showed 2 cm nodule in the right lung apex, thoracic nodal metastasis.  2.5 cm hepatic meta stasis.  Additional small hepatic lesions incompletely characterized.  Bilateral adrenal meta stasis.  Multifocal bone metastasis throughout the axial and appendicular skeleton. -Cycle 1 of carboplatin, pemetrexed and pembrolizumab started on 01/17/2020.  2. Prostate cancer: -Diagnosed in 2017, seed implants done in June 2018.  3. Social/family history: -He drives a Teacher, English as a foreign language. Quit smoking in 1992, 2 packs/day for 32 years. -Brother had prostate cancer. Sister had skin cancer and mother had throat cancer.  4. Brain metastasis: -MRI of the brain on 12/28/2019 shows bilateral cerebellar metastasis measuring 6 to 7 mm. Enhancing 1.2 cm left clival metastasis. -SRS to the brain lesion on 01/25/2020.   PLAN:  1. Metastatic adenocarcinoma  of the lung to the liver and adrenal gland: -He has tolerated second cycle well except for nausea and vomiting. -Reviewed his labs.  Albumin is low at 2.9.  CBC is normal.  TSH is 1.66. -He will proceed with cycle 3 of carboplatin, pemetrexed and pembrolizumab. -Clinically he has shown benefit from chemotherapy as his pain improved. -RTC 3 weeks with repeat PET CT scan to evaluate response. -Due to decreased energy and lower extremity weakness, he will benefit from bedside commode and wheelchair.  2. Brain metastasis: -SRS to the brain lesion completed on 01/25/2020. -No more headaches  reported.  3. Generalized pains: -He will use oxycodone 10 mg as needed.  He is not requiring on regular basis.  4.  Uncontrolled nausea/vomiting: -He is receiving Aloxi and Emend prior to chemo.  However he still has nausea and vomiting. -We have sent a prescription for Phenergan suppository to be used every 6 hours as needed. -I have given Sancuso patch.  Patient forgot to apply the day before. -I have recommended him to place the patch on Monday and leave it for 5-7 days.  He will also use Phenergan suppository as needed. -He will continue dexamethasone 2 mg in the mornings for nausea. -If he continues to be nauseous, will consider olanzapine for 5 to 10 days with each cycle of chemo.  Orders placed this encounter:  Orders Placed This Encounter  Procedures  . NM PET Image Restag (PS) Skull Base To Thigh  . Magnesium     [name], MD [place] [phone]   I, Milinda Antis, am acting as a scribe for Dr. Sanda Linger.  I, [name] MD, have reviewed the above documentation for accuracy and completeness, and I agree with the above.

## 2020-02-28 NOTE — Patient Instructions (Signed)
[place] at [facility] Discharge Instructions  You were seen today by Dr. Delton Coombes. He went over your recent results. You received your treatment today. Stop taking the Zofran dissolvable tablet. Take dexamethasone 1 tablet every morning for 10 days after receiving your chemo. Apply the Sancuso patch on Monday, November 22nd, and keep it on for 7 days. Take 1 packet of Miralax as needed for constipation in addition to the stool softener. You will be scheduled for a PET scan before your next visit. Dr. Delton Coombes will see you back in 3 weeks for labs and follow up.   Thank you for choosing [place] at [facility] to provide your oncology and hematology care.  To afford each patient quality time with our provider, please arrive at least 15 minutes before your scheduled appointment time.   If you have a lab appointment with the [facility] please come in thru the Main Entrance and check in at the main information desk  You need to re-schedule your appointment should you arrive 10 or more minutes late.  We strive to give you quality time with our providers, and arriving late affects you and other patients whose appointments are after yours.  Also, if you no show three or more times for appointments you may be dismissed from the clinic at the providers discretion.     Again, thank you for choosing [facility].  Our hope is that these requests will decrease the amount of time that you wait before being seen by our physicians.       _____________________________________________________________  Should you have questions after your visit to [facility], please contact our office at [phone] between the hours of 8:00 a.m. and 4:30 p.m.  Voicemails left after 4:00 p.m. will not be returned until the following business day.  For prescription refill requests, have your pharmacy contact our office and allow 72 hours.    Cancer  Center Support Programs:   > Cancer Support Group  2nd Tuesday of the month 1pm-2pm, Journey Room

## 2020-02-28 NOTE — Progress Notes (Signed)
Patient presents today for treatment.  Vital signs within parameters.  No new complaints.  Labs reviewed.  Verbal order patient okay for treatment.    Treatment given today per MD orders.  Tolerated infusion without adverse affects.  Vital signs stable.  No complaints at this time.  Discharge from clinic via wheelchair in stable condition.  Alert and oriented X 3.  Follow up with [facility] as scheduled.

## 2020-02-29 ENCOUNTER — Ambulatory Visit (HOSPITAL_COMMUNITY): Payer: Medicare Other

## 2020-02-29 NOTE — Progress Notes (Signed)
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 Patient receiving chemotherapy of carboplatin/keytruda/alimta. Has completed radiation to brain mets.   Spoke with patient via phone.  Reports that he was doing really well then over the last few days started with nausea, vomiting.  "It comes on me all of a sudden."  Noted medications changed for nausea.  Patient reports feels good this am, drinking liquids (water and gatorade). Last night ate spaghetti and lemon pie and felt nauseated.  Reports was nauseated while drinking shakes so backed off.  Will restart when feeling better. Likes chocolate and strawberry.   Medications: phenergan, sansuco patch, dexamethasone  Labs: reviewed  Anthropometrics:   Weight 199 lb 6.4 oz on 11/18 decreased from 213 lb on 10/14   NUTRITION DIAGNOSIS: Inadequate oral intake continues    INTERVENTION:  Encouraged taking nausea medications as prescribed. Encouraged small frequent nibbles/snacks of bland, lowfat foods.  Patient has handout of foods appropriate for nausea and vomiting. Encouraged oral nutrition supplements as tolerated.     MONITORING, EVALUATION, GOAL: weight trends, intake   NEXT VISIT: Dec 17 phone  Lance Stafford, [place], [place] Registered Dietitian [phone] (mobile)

## 2020-03-02 ENCOUNTER — Emergency Department (HOSPITAL_COMMUNITY): Payer: Medicare Other

## 2020-03-02 ENCOUNTER — Other Ambulatory Visit: Payer: Self-pay

## 2020-03-02 ENCOUNTER — Encounter (HOSPITAL_COMMUNITY): Payer: Self-pay

## 2020-03-02 ENCOUNTER — Observation Stay (HOSPITAL_COMMUNITY)
Admission: EM | Admit: 2020-03-02 | Discharge: 2020-03-03 | Disposition: A | Discharge status: Home / Self Care | Payer: Medicare Other | Attending: Internal Medicine | Admitting: Internal Medicine

## 2020-03-02 DIAGNOSIS — C3490 Malignant neoplasm of unspecified part of unspecified bronchus or lung: Secondary | ICD-10-CM | POA: Diagnosis not present

## 2020-03-02 DIAGNOSIS — Z7982 Long term (current) use of aspirin: Secondary | ICD-10-CM | POA: Insufficient documentation

## 2020-03-02 DIAGNOSIS — Z20822 Contact with and (suspected) exposure to covid-19: Secondary | ICD-10-CM | POA: Diagnosis not present

## 2020-03-02 DIAGNOSIS — I4891 Unspecified atrial fibrillation: Secondary | ICD-10-CM | POA: Diagnosis not present

## 2020-03-02 DIAGNOSIS — Z87891 Personal history of nicotine dependence: Secondary | ICD-10-CM | POA: Diagnosis not present

## 2020-03-02 DIAGNOSIS — Z7189 Other specified counseling: Secondary | ICD-10-CM

## 2020-03-02 DIAGNOSIS — C7971 Secondary malignant neoplasm of right adrenal gland: Secondary | ICD-10-CM

## 2020-03-02 DIAGNOSIS — R531 Weakness: Secondary | ICD-10-CM | POA: Diagnosis present

## 2020-03-02 DIAGNOSIS — R112 Nausea with vomiting, unspecified: Secondary | ICD-10-CM | POA: Diagnosis not present

## 2020-03-02 DIAGNOSIS — Z515 Encounter for palliative care: Secondary | ICD-10-CM

## 2020-03-02 DIAGNOSIS — Z8546 Personal history of malignant neoplasm of prostate: Secondary | ICD-10-CM | POA: Diagnosis not present

## 2020-03-02 LAB — RESP PANEL BY RT-PCR (FLU A&B, COVID) ARPGX2
Influenza A by PCR: NEGATIVE
Influenza B by PCR: NEGATIVE
SARS Coronavirus 2 by RT PCR: NEGATIVE

## 2020-03-02 LAB — COMPREHENSIVE METABOLIC PANEL
ALT: 39 U/L (ref 0–44)
AST: 35 U/L (ref 15–41)
Albumin: 3.1 g/dL — ABNORMAL LOW (ref 3.5–5.0)
Alkaline Phosphatase: 145 U/L — ABNORMAL HIGH (ref 38–126)
Anion gap: 13 (ref 5–15)
BUN: 14 mg/dL (ref 8–23)
CO2: 25 mmol/L (ref 22–32)
Calcium: 8.5 mg/dL — ABNORMAL LOW (ref 8.9–10.3)
Chloride: 96 mmol/L — ABNORMAL LOW (ref 98–111)
Creatinine, Ser: 1.11 mg/dL (ref 0.61–1.24)
GFR, Estimated: >60 mL/min (ref >60)
Glucose, Bld: 146 mg/dL — ABNORMAL HIGH (ref 70–99)
Potassium: 3.5 mmol/L (ref 3.5–5.1)
Sodium: 134 mmol/L — ABNORMAL LOW (ref 135–145)
Total Bilirubin: 1 mg/dL (ref 0.3–1.2)
Total Protein: 7.4 g/dL (ref 6.5–8.1)

## 2020-03-02 LAB — URINALYSIS, ROUTINE W REFLEX MICROSCOPIC
Bilirubin Urine: NEGATIVE
Glucose, UA: NEGATIVE mg/dL
Ketones, ur: 20 mg/dL — AB
Leukocytes,Ua: NEGATIVE
Nitrite: NEGATIVE
Protein, ur: 30 mg/dL — AB
Specific Gravity, Urine: 1.013 (ref 1.005–1.030)
pH: 5 (ref 5.0–8.0)

## 2020-03-02 LAB — CBC WITH DIFFERENTIAL/PLATELET
Abs Immature Granulocytes: 0.04 10*3/uL (ref 0.00–0.07)
Basophils Absolute: 0 10*3/uL (ref 0.0–0.1)
Basophils Relative: 0 %
Eosinophils Absolute: 0 10*3/uL (ref 0.0–0.5)
Eosinophils Relative: 0 %
HCT: 43.6 % (ref 39.0–52.0)
Hemoglobin: 15.2 g/dL (ref 13.0–17.0)
Immature Granulocytes: 1 %
Lymphocytes Relative: 23 %
Lymphs Abs: 1.5 10*3/uL (ref 0.7–4.0)
MCH: 31.7 pg (ref 26.0–34.0)
MCHC: 34.9 g/dL (ref 30.0–36.0)
MCV: 90.8 fL (ref 80.0–100.0)
Monocytes Absolute: 0.2 10*3/uL (ref 0.1–1.0)
Monocytes Relative: 3 %
Neutro Abs: 5 10*3/uL (ref 1.7–7.7)
Neutrophils Relative %: 73 %
Platelets: 303 10*3/uL (ref 150–400)
RBC: 4.8 MIL/uL (ref 4.22–5.81)
RDW: 14.2 % (ref 11.5–15.5)
WBC: 6.8 10*3/uL (ref 4.0–10.5)
nRBC: 0 % (ref 0.0–0.2)

## 2020-03-02 LAB — TROPONIN I (HIGH SENSITIVITY)
Troponin I (High Sensitivity): 11 ng/L (ref <18)
Troponin I (High Sensitivity): 11 ng/L (ref <18)

## 2020-03-02 LAB — TSH: TSH: 2.333 u[IU]/mL (ref 0.350–4.500)

## 2020-03-02 MED ORDER — FOLIC ACID 1 MG PO TABS
1.0000 mg | ORAL_TABLET | Freq: Every day | ORAL | Status: DC
  Administered 2020-03-03: 1 mg via ORAL
  Filled 2020-03-02: qty 1

## 2020-03-02 MED ORDER — CHLORHEXIDINE GLUCONATE CLOTH 2 % EX PADS
6.0000 | MEDICATED_PAD | Freq: Every day | CUTANEOUS | Status: DC
  Administered 2020-03-03: 6 via TOPICAL

## 2020-03-02 MED ORDER — ONDANSETRON HCL 4 MG/2ML IJ SOLN: 4.0000 mg | Freq: Four times a day (QID) | INTRAMUSCULAR | Status: DC | PRN

## 2020-03-02 MED ORDER — DOCUSATE SODIUM 100 MG PO CAPS
200.0000 mg | ORAL_CAPSULE | Freq: Every day | ORAL | Status: DC
  Filled 2020-03-02: qty 2

## 2020-03-02 MED ORDER — SODIUM CHLORIDE 0.9 % IV SOLN: INTRAVENOUS | Status: AC

## 2020-03-02 MED ORDER — ASPIRIN EC 81 MG PO TBEC
81.0000 mg | DELAYED_RELEASE_TABLET | Freq: Every day | ORAL | Status: DC
  Administered 2020-03-02 – 2020-03-03 (×2): 81 mg via ORAL
  Filled 2020-03-02: qty 1

## 2020-03-02 MED ORDER — PANTOPRAZOLE SODIUM 40 MG IV SOLR
40.0000 mg | INTRAVENOUS | Status: DC
  Administered 2020-03-02: 40 mg via INTRAVENOUS
  Filled 2020-03-02: qty 40

## 2020-03-02 MED ORDER — SODIUM CHLORIDE 0.9 % IV SOLN: INTRAVENOUS | Status: DC

## 2020-03-02 MED ORDER — ATORVASTATIN CALCIUM 40 MG PO TABS
40.0000 mg | ORAL_TABLET | Freq: Every day | ORAL | Status: DC
  Administered 2020-03-02: 40 mg via ORAL
  Filled 2020-03-02: qty 1

## 2020-03-02 MED ORDER — ONDANSETRON HCL 4 MG PO TABS: 4.0000 mg | ORAL_TABLET | Freq: Four times a day (QID) | ORAL | Status: DC | PRN

## 2020-03-02 MED ORDER — ACETAMINOPHEN 325 MG PO TABS: 650.0000 mg | ORAL_TABLET | Freq: Four times a day (QID) | ORAL | Status: DC | PRN

## 2020-03-02 MED ORDER — SODIUM CHLORIDE 0.9 % IV BOLUS
1000.0000 mL | Freq: Once | INTRAVENOUS | Status: AC
  Administered 2020-03-02: 1000 mL via INTRAVENOUS

## 2020-03-02 MED ORDER — DEXAMETHASONE 4 MG PO TABS
2.0000 mg | ORAL_TABLET | Freq: Every day | ORAL | Status: DC
  Administered 2020-03-03: 2 mg via ORAL
  Filled 2020-03-02: qty 1

## 2020-03-02 MED ORDER — ACETAMINOPHEN 650 MG RE SUPP: 650.0000 mg | Freq: Four times a day (QID) | RECTAL | Status: DC | PRN

## 2020-03-02 MED ORDER — PANTOPRAZOLE SODIUM 40 MG PO TBEC
40.0000 mg | DELAYED_RELEASE_TABLET | Freq: Every day | ORAL | Status: DC
Start: 2020-03-02 — End: 2020-03-02

## 2020-03-02 MED ORDER — OXYCODONE HCL 5 MG PO TABS: 10.0000 mg | ORAL_TABLET | Freq: Four times a day (QID) | ORAL | Status: DC | PRN

## 2020-03-02 MED ORDER — METOPROLOL TARTRATE 5 MG/5ML IV SOLN
5.0000 mg | Freq: Once | INTRAVENOUS | Status: AC
  Administered 2020-03-02: 5 mg via INTRAVENOUS
  Filled 2020-03-02: qty 5

## 2020-03-02 MED ORDER — ENOXAPARIN SODIUM 40 MG/0.4ML ~~LOC~~ SOLN
40.0000 mg | SUBCUTANEOUS | Status: DC
  Administered 2020-03-02: 40 mg via SUBCUTANEOUS

## 2020-03-02 NOTE — ED Provider Notes (Signed)
°  Face-to-face evaluation   History: He is here for evaluation of ongoing nausea, vomiting and diarrhea following last chemotherapy session, 4 days ago.  He uses prescribed medication without relief.  He denies abdominal pain.  He is being treated for metastatic cancer.  Physical exam: Elderly, alert and cooperative.  Oral mucous membranes are moist.  Heart is tachycardic with a regular rhythm.  Abdomen soft and nontender.  No respiratory distress.  Medical screening examination/treatment/procedure(s) were conducted as a shared visit with non-physician practitioner(s) and myself.  I personally evaluated the patient during the encounter    Daleen Bo, MD 03/03/20 1413

## 2020-03-02 NOTE — ED Triage Notes (Signed)
Pt presents to ED with nausea, vomiting, diarrhea since Thurs. Pt is a cancer pt and has chemo every 3 weeks. Port on left chest is just for Chemo.

## 2020-03-02 NOTE — ED Provider Notes (Signed)
[facility] EMERGENCY DEPARTMENT Provider Note   CSN: [id number] Arrival date & time: 03/02/20  4098     History Chief Complaint  Patient presents with  . Dehydration    Marrell Dicaprio is a 74 y.o. male.  Pt has stage 4 lung cancer.  Pt had chemotherapy 3 days ago.  Pt reports nauseated and unable to eat or drink since.  Pt complains of feeling weak.  Pt also reports increased heart rate.    The history is provided by the patient and the spouse. No language interpreter was used.  Weakness Severity:  Severe Onset quality:  Gradual Duration:  3 days Timing:  Constant Progression:  Worsening Chronicity:  New Relieved by:  Nothing Worsened by:  Nothing Ineffective treatments:  None tried Associated symptoms: nausea        Past Medical History:  Diagnosis Date  . Arthritis   . Chronic low back pain    truck driver  . GERD (gastroesophageal reflux disease)   . Hyperlipidemia   . Lung cancer ([place])    stage IV non small cell lung ca  . Nocturia   . Port-A-Cath in place 01/10/2020  . 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 Wears glasses   . Wears partial dentures    upper and lower    Patient Active Problem List   Diagnosis Date Noted  . Dehydration 02/14/2020  . Brain metastases ([place]) 01/22/2020  . Bone metastases ([place]) 01/22/2020  . Metastases to the liver ([place]) 01/22/2020  . 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 Port-A-Cath in place 01/10/2020  . Goals of care, counseling/discussion 01/03/2020  . Malignant neoplasm of right lung ([place]) 12/20/2019  . Liver mass 11/26/2019  . 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 GERD (gastroesophageal reflux disease) 11/26/2019  . Abnormal CT of the abdomen 10/25/2019  . Abdominal pain 10/23/2019  . Chest pain 10/23/2019  . Smoker 10/23/2019  . Vaccine refused by patient 10/23/2019  . Dysphagia  10/16/2018  . Prostate cancer ([place]) 06/18/2016  . Carcinoma of prostate ([place]) 06/17/2016  . Hyperlipidemia 06/17/2016  . Lumbago 07/01/2006  . Osteoarthritis 07/01/2006  . Benign neoplasm of colon 12/25/2002  . Cardiomegaly 12/10/2002  . Edema leg 11/16/2002  . Pure hypercholesterolemia 07/13/2002    Past Surgical History:  Procedure Laterality Date  . CATARACT EXTRACTION W/ INTRAOCULAR LENS  IMPLANT, BILATERAL  2015  . CHOLECYSTECTOMY    . CYSTOSCOPY  09/30/2016   Procedure: CYSTOSCOPY;  Surgeon: Irine Seal, MD;  Location: [facility];  Service: Urology;;  no seeds found in bladder  . PORTACATH PLACEMENT Left 01/14/2020   Procedure: INSERTION PORT-A-CATH;  Surgeon: Aviva Signs, MD;  Location: [facility];  Service: General;  Laterality: Left;  . RADIOACTIVE SEED IMPLANT N/A 09/30/2016   Procedure: RADIOACTIVE SEED IMPLANT/BRACHYTHERAPY IMPLANT, SPACE OAR;  Surgeon: Irine Seal, MD;  Location: [facility];  Service: Urology;  Laterality: N/A;  70 seeds implanted  . SPERMATOCELECTOMY Left 01/30/2015   Procedure: SPERMATOCELECTOMY;  Surgeon: Irine Seal, MD;  Location[facility];  Service: Urology;  Laterality: Left;       Family History  Problem Relation Age of Onset  . Diabetes Mother   . Stroke Mother   . Throat cancer Mother   . Skin cancer Sister   . Prostate cancer Brother   . Cancer  Paternal Grandmother   . Clotting disorder Daughter     Social History   Tobacco Use  . Smoking status: Former Smoker    Packs/day: 2.00    Years: 33.00    Pack years: 66.00    Types: Cigarettes    Quit date: 01/23/1991    Years since quitting: 29.1  . Smokeless tobacco: Never Used  Vaping Use  . Vaping Use: Never used  Substance Use Topics  . Alcohol use: No  . Drug use: No    Home Medications Prior to Admission medications   Medication Sig Start Date End Date Taking? Authorizing Provider  aspirin EC 81 MG tablet Take 81 mg by mouth  daily.     [provider]  atorvastatin (LIPITOR) 40 MG tablet Take 40 mg by mouth daily.    [provider]  CARBOPLATIN IV Inject into the vein every 21 ( twenty-one) days. 01/17/20   [provider]  dexamethasone (DECADRON) 2 MG tablet Take 1 tablet (2 mg total) by mouth daily with breakfast. 02/26/20   [name], MD  docusate sodium (COLACE) 100 MG capsule Take 200 mg by mouth daily.    [provider]  folic acid (FOLVITE) 1 MG tablet Take 1 tablet (1 mg total) by mouth daily. 01/03/20   [name], MD  Lactulose 20 GM/30ML SOLN Take 30 ml by mouth every 3 hours until bowel movement is had; then continue taking 30 ml by mouth once daily 01/18/20   [name], MD  lidocaine-prilocaine (EMLA) cream Apply a small amount to port a cath site and cover with plastic wrap 1 hour prior to chemotherapy appointments 01/10/20   [name], MD  Misc. Devices MISC Bedside Commode  Adjustable bedside table  Shower stool (not shower chair)  Lightweight wheelchair DX: C34.91- Right lung cancer 02/13/20   [name], MD  omeprazole (PRILOSEC) 40 MG capsule Take 40 mg by mouth daily.    [provider]  ondansetron (ZOFRAN ODT) 8 MG disintegrating tablet Take 1 tablet (8 mg total) by mouth every 8 (eight) hours as needed for nausea or vomiting. 02/14/20   [name], MD  oxyCODONE (OXY IR/ROXICODONE) 5 MG immediate release tablet Take 2 tablets (10 mg total) by mouth every 6 (six) hours as needed for severe pain. 02/26/20   [name], MD  PEMEtrexed 500 mg/m2 in sodium chloride 0.9 % 100 mL Inject 500 mg/m2 into the vein every 21 ( twenty-one) days. 12/18/19   [provider]  promethazine (PHENERGAN) 25 MG tablet Take 1 tablet (25 mg total) by mouth every 6 (six) hours as needed for nausea or vomiting. 02/28/20   [name], MD  sodium chloride 0.9 % SOLN 50 mL with pembrolizumab  100 MG/4ML SOLN 2 mg/kg Inject 200 mg into the vein every 21 ( twenty-one) days. 01/17/20   [provider]    Allergies    Patient has no known allergies.  Review of Systems   Review of Systems  Gastrointestinal: Positive for nausea.  Neurological: Positive for weakness.  All other systems reviewed and are negative.   Physical Exam Updated Vital Signs BP 99/71   Pulse 85   Temp 98 F (36.7 C) (Oral)   Resp 20   Ht 6\' 1"  (1.854 m)   Wt 88.5 kg   SpO2 97%   BMI 25.73 kg/m   Physical Exam Vitals and nursing note reviewed.  Constitutional:      Appearance: He is well-developed. He is  ill-appearing.  HENT:     Head: Normocephalic and atraumatic.     Nose: Nose normal.     Mouth/Throat:     Mouth: Mucous membranes are moist.  Eyes:     Conjunctiva/sclera: Conjunctivae normal.  Cardiovascular:     Rate and Rhythm: Tachycardia present.     Heart sounds: No murmur heard.   Pulmonary:     Effort: Pulmonary effort is normal. No respiratory distress.     Breath sounds: Normal breath sounds.  Abdominal:     Palpations: Abdomen is soft.     Tenderness: There is no abdominal tenderness.  Musculoskeletal:        General: Normal range of motion.     Cervical back: Neck supple.  Skin:    General: Skin is warm and dry.  Neurological:     General: No focal deficit present.     Mental Status: He is alert.     ED Results / Procedures / Treatments   Labs (all labs ordered are listed, but only abnormal results are displayed) Labs Reviewed  COMPREHENSIVE METABOLIC PANEL - Abnormal; Notable for the following components:      Result Value   Sodium 134 (*)    Chloride 96 (*)    Glucose, Bld 146 (*)    Calcium 8.5 (*)    Albumin 3.1 (*)    Alkaline Phosphatase 145 (*)    All other components within normal limits  URINALYSIS, ROUTINE W REFLEX MICROSCOPIC - Abnormal; Notable for the following components:   Hgb urine dipstick MODERATE (*)    Ketones, ur 20 (*)     Protein, ur 30 (*)    Bacteria, UA RARE (*)    All other components within normal limits  RESP PANEL BY RT-PCR (FLU A&B, COVID) ARPGX2  CBC WITH DIFFERENTIAL/PLATELET  TROPONIN I (HIGH SENSITIVITY)    EKG EKG Interpretation  Date/Time:  Sunday March 02 2020 10:21:20 EST Ventricular Rate:  159 PR Interval:    QRS Duration: 89 QT Interval:  271 QTC Calculation: 441 R Axis:   70 Text Interpretation: Atrial fibrillation with rapid V-rate Repolarization abnormality, prob rate related Since last tracing of earlier today No significant change was found Confirmed by Daleen Bo [phone]) on 03/02/2020 11:28:06 AM   Radiology DG Chest Port 1 View  Result Date: 03/02/2020 CLINICAL DATA:  Weakness and dehydration.  History of lung cancer. EXAM: PORTABLE CHEST 1 VIEW COMPARISON:  01/14/2020 FINDINGS: Left subclavian Port-A-Cath unchanged. Lungs are adequately inflated without focal airspace consolidation or effusion. Known right apical nodule not well visualized. Minimal biapical pleural thickening unchanged. Cardiomediastinal silhouette and remainder of the exam is unchanged. IMPRESSION: No acute cardiopulmonary disease. Electronically Signed   By: Marin Olp M.D.   On: 03/02/2020 11:47    Procedures Procedures (including critical care time)  Medications Ordered in ED Medications  0.9 %  sodium chloride infusion ( Intravenous New Bag/Given 03/02/20 1238)  sodium chloride 0.9 % bolus 1,000 mL (0 mLs Intravenous Stopped 03/02/20 1156)  metoprolol tartrate (LOPRESSOR) injection 5 mg (5 mg Intravenous Given 03/02/20 1240)    ED Course  I have reviewed the triage vital signs and the nursing notes.  Pertinent labs & imaging results that were available during my care of the patient were reviewed by me and considered in my medical decision making (see chart for details).    MDM Rules/Calculators/A&P  MDM:  Pt given Iv fluids x 1 liter.  Pt reports he feels  some better.  EKg shows new onset of afib with rvr.  Pt given metoprolol with rate slowing.   Dr. Eulis Foster in to see and examine. Hospitalist consulted  Final Clinical Impression(s) / ED Diagnoses Final diagnoses:  New onset a-fib ([place])  Weakness  Non-intractable vomiting with nausea, unspecified vomiting type  Malignant neoplasm of lung, unspecified laterality, unspecified part of lung [facility])    Rx / DC Orders ED Discharge Orders    None       Fransico Meadow, [place] 03/02/20 1351    Daleen Bo, MD 03/03/20 1414

## 2020-03-02 NOTE — H&P (Addendum)
History and Physical    Lance Stafford BMW:[id number] DOB: 10/14/1945 DOA: 03/02/2020  PCP: [name], NP   Patient coming from: Home  Chief Complaint: Nausea/weakness  HPI: Lance Stafford is a 74 y.o. 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  He denies any palpitations, chest pain, shortness of breath, fevers, or chills.  He denies any specific abdominal pain as well.   ED Course: Patient was noted to be tachycardic and EKG demonstrated atrial fibrillation with RVR which appears to be of new onset.  He had received IV metoprolol with improvement in his heart rate control.  He has also been started on IV fluid as he appears dehydrated.  Chest x-ray with no acute findings.  Labs with sodium 134 and glucose 146.  Covid testing and flu swab negative.  Review of Systems: All others reviewed and otherwise negative except as noted above.  Past Medical History:  Diagnosis Date  . Arthritis   . Chronic low back pain    truck driver  . GERD (gastroesophageal reflux disease)   . Hyperlipidemia   . Lung cancer ([place])    stage IV non small cell lung ca  . Nocturia   . Port-A-Cath in place 01/10/2020  . 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 Wears glasses   . Wears partial dentures    upper and lower    Past Surgical History:  Procedure Laterality Date  . CATARACT EXTRACTION W/ INTRAOCULAR LENS  IMPLANT, BILATERAL  2015  . CHOLECYSTECTOMY    . CYSTOSCOPY  09/30/2016   Procedure: CYSTOSCOPY;  Surgeon: Irine Seal, MD;  Location: [facility];  Service: Urology;;  no seeds found in bladder  . PORTACATH PLACEMENT Left 01/14/2020    Procedure: INSERTION PORT-A-CATH;  Surgeon: Aviva Signs, MD;  Location: [facility];  Service: General;  Laterality: Left;  . RADIOACTIVE SEED IMPLANT N/A 09/30/2016   Procedure: RADIOACTIVE SEED IMPLANT/BRACHYTHERAPY IMPLANT, SPACE OAR;  Surgeon: Irine Seal, MD;  Location[facility];  Service: Urology;  Laterality: N/A;  70 seeds implanted  . SPERMATOCELECTOMY Left 01/30/2015   Procedure: SPERMATOCELECTOMY;  Surgeon: Irine Seal, MD;  Location: [facility];  Service: Urology;  Laterality: Left;     reports that he quit smoking about 29 years ago. His smoking use included cigarettes. He has a 66.00 pack-year smoking history. He has never used smokeless tobacco. He reports that he does not drink alcohol and does not use drugs.  No Known Allergies  Family History  Problem Relation Age of Onset  . Diabetes Mother   . Stroke Mother   . Throat cancer Mother   . Skin cancer Sister   . Prostate cancer Brother   . Cancer Paternal Grandmother   . Clotting disorder Daughter     Prior to Admission medications   Medication Sig Start Date End Date Taking? Authorizing Provider  aspirin EC 81 MG tablet Take 81 mg by mouth daily.     [provider]  atorvastatin (LIPITOR) 40 MG tablet Take 40 mg by mouth daily.    [provider]  CARBOPLATIN IV Inject into the vein every 21 (  twenty-one) days. 01/17/20   [provider]  dexamethasone (DECADRON) 2 MG tablet Take 1 tablet (2 mg total) by mouth daily with breakfast. 02/26/20   [name], MD  docusate sodium (COLACE) 100 MG capsule Take 200 mg by mouth daily.    [provider]  folic acid (FOLVITE) 1 MG tablet Take 1 tablet (1 mg total) by mouth daily. 01/03/20   [name], MD  Lactulose 20 GM/30ML SOLN Take 30 ml by mouth every 3 hours until bowel movement is had; then continue taking 30 ml by mouth once daily 01/18/20   [name], MD  lidocaine-prilocaine  (EMLA) cream Apply a small amount to port a cath site and cover with plastic wrap 1 hour prior to chemotherapy appointments 01/10/20   [name], MD  Misc. Devices MISC Bedside Commode  Adjustable bedside table  Shower stool (not shower chair)  Lightweight wheelchair DX: C34.91- Right lung cancer 02/13/20   [name], MD  omeprazole (PRILOSEC) 40 MG capsule Take 40 mg by mouth daily.    [provider]  ondansetron (ZOFRAN ODT) 8 MG disintegrating tablet Take 1 tablet (8 mg total) by mouth every 8 (eight) hours as needed for nausea or vomiting. 02/14/20   [name], MD  oxyCODONE (OXY IR/ROXICODONE) 5 MG immediate release tablet Take 2 tablets (10 mg total) by mouth every 6 (six) hours as needed for severe pain. 02/26/20   [name], MD  PEMEtrexed 500 mg/m2 in sodium chloride 0.9 % 100 mL Inject 500 mg/m2 into the vein every 21 ( twenty-one) days. 12/18/19   [provider]  promethazine (PHENERGAN) 25 MG tablet Take 1 tablet (25 mg total) by mouth every 6 (six) hours as needed for nausea or vomiting. 02/28/20   [name], MD  sodium chloride 0.9 % SOLN 50 mL with pembrolizumab 100 MG/4ML SOLN 2 mg/kg Inject 200 mg into the vein every 21 ( twenty-one) days. 01/17/20   [provider]    Physical Exam: Vitals:   03/02/20 1200 03/02/20 1230 03/02/20 1300 03/02/20 1315  BP: 103/78 99/72 99/71    Pulse: (!) 58 (!) 152 (!) 105 85  Resp: (!) 21 20 14 20   Temp:      TempSrc:      SpO2: 96% 97% 96% 97%  Weight:      Height:        Constitutional: NAD, calm, comfortable Vitals:   03/02/20 1200 03/02/20 1230 03/02/20 1300 03/02/20 1315  BP: 103/78 99/72 99/71    Pulse: (!) 58 (!) 152 (!) 105 85  Resp: (!) 21 20 14 20   Temp:      TempSrc:      SpO2: 96% 97% 96% 97%  Weight:      Height:       Eyes: lids and conjunctivae normal ENMT: Mucous membranes are moist.  Neck: normal, supple Respiratory: clear to  auscultation bilaterally. Normal respiratory effort. No accessory muscle use.  Cardiovascular: Irregular rate and rhythm, no murmurs. No extremity edema.  Tachycardic. Abdomen: no tenderness, no distention. Bowel sounds positive.  Musculoskeletal:  No joint deformity upper and lower extremities.   Skin: no rashes, lesions, ulcers.  Psychiatric: Normal judgment and insight. Alert and oriented x 3. Normal mood.   Labs on Admission: I have personally reviewed following labs and imaging studies  CBC: Recent Labs  Lab 02/26/20 1006 02/28/20 0753 03/02/20 1024  WBC 4.2 5.6 6.8  NEUTROABS 1.7 2.2 5.0  HGB 14.0 13.5 15.2  HCT 40.6  39.0 43.6  MCV 90.6 91.3 90.8  PLT 196 275 010   Basic Metabolic Panel: Recent Labs  Lab 02/26/20 1006 02/28/20 0753 03/02/20 1024  NA 134* 133* 134*  K 3.6 3.3* 3.5  CL 96* 99 96*  CO2 28 25 25   GLUCOSE 115* 118* 146*  BUN 15 11 14   CREATININE 1.21 0.91 1.11  CALCIUM 8.3* 8.3* 8.5*  MG 1.7  --   --    GFR: Estimated Creatinine Clearance: 69 mL/min (by C-G formula based on SCr of 1.11 mg/dL). Liver Function Tests: Recent Labs  Lab 02/26/20 1006 02/28/20 0753 03/02/20 1024  AST 13* 22 35  ALT 21 19 39  ALKPHOS 115 114 145*  BILITOT 1.0 0.6 1.0  PROT 6.3* 6.4* 7.4  ALBUMIN 2.9* 2.9* 3.1*   No results for input(s): LIPASE, AMYLASE in the last 168 hours. No results for input(s): AMMONIA in the last 168 hours. Coagulation Profile: No results for input(s): INR, PROTIME in the last 168 hours. Cardiac Enzymes: No results for input(s): CKTOTAL, CKMB, CKMBINDEX, TROPONINI in the last 168 hours. BNP (last 3 results) No results for input(s): PROBNP in the last 8760 hours. HbA1C: No results for input(s): HGBA1C in the last 72 hours. CBG: No results for input(s): GLUCAP in the last 168 hours. Lipid Profile: No results for input(s): CHOL, HDL, LDLCALC, TRIG, CHOLHDL, LDLDIRECT in the last 72 hours. Thyroid Function Tests: No results for  input(s): TSH, T4TOTAL, FREET4, T3FREE, THYROIDAB in the last 72 hours. Anemia Panel: No results for input(s): VITAMINB12, FOLATE, FERRITIN, TIBC, IRON, RETICCTPCT in the last 72 hours. Urine analysis:    Component Value Date/Time   COLORURINE YELLOW 03/02/2020 1144   APPEARANCEUR CLEAR 03/02/2020 1144   LABSPEC 1.013 03/02/2020 1144   PHURINE 5.0 03/02/2020 1144   GLUCOSEU NEGATIVE 03/02/2020 1144   HGBUR MODERATE (A) 03/02/2020 1144   BILIRUBINUR NEGATIVE 03/02/2020 1144   BILIRUBINUR small 07/06/2019 0848   KETONESUR 20 (A) 03/02/2020 1144   PROTEINUR 30 (A) 03/02/2020 1144   UROBILINOGEN negative (A) 07/06/2019 0848   NITRITE NEGATIVE 03/02/2020 1144   LEUKOCYTESUR NEGATIVE 03/02/2020 1144    Radiological Exams on Admission: DG Chest Port 1 View  Result Date: 03/02/2020 CLINICAL DATA:  Weakness and dehydration.  History of lung cancer. EXAM: PORTABLE CHEST 1 VIEW COMPARISON:  01/14/2020 FINDINGS: Left subclavian Port-A-Cath unchanged. Lungs are adequately inflated without focal airspace consolidation or effusion. Known right apical nodule not well visualized. Minimal biapical pleural thickening unchanged. Cardiomediastinal silhouette and remainder of the exam is unchanged. IMPRESSION: No acute cardiopulmonary disease. Electronically Signed   By: Marin Olp M.D.   On: 03/02/2020 11:47    EKG: Independently reviewed. Afib 159bpm.  Assessment/Plan Active Problems:   New onset a-fib ([place])    New onset atrial fibrillation with RVR -Plan to obtain TSH and 2D echocardiogram -Patient currently appears rate controlled and appropriate for telemetry -IV metoprolol pushes as needed for heart rate control -Start Cardizem 30 mg every 8 hours and monitor -CHA2DS2-VASc score of 1, plan to avoid anticoagulation for now and continue home aspirin -Appreciate cardiology evaluation in a.m. for further recommendations  Intractable nausea and vomiting -Likely secondary to recent  chemotherapy -Clear liquid diet and advance as tolerated  History of stage IV lung adenocarcinoma with metastasis/prostate cancer/brain metastasis -Follow-up with oncology -Plan to consult palliative care for goals of care discussion   DVT prophylaxis: Lovenox Code Status: Full code Family Communication: Wife at bedside Disposition Plan:Admit for treatment of n/v/dehdration  and afib/rvr evaluation Consults called:Cardiology Admission status: Obs, SDU   Lance Bonadonna D Lo[place] Neuroth DO [facility]  If 7PM-7AM, please contact night-coverage [email or web address]  03/02/2020, 2:05 PM

## 2020-03-02 NOTE — ED Notes (Signed)
Pt was informed that we need a urine specimen. Urinal at bedside.

## 2020-03-03 ENCOUNTER — Observation Stay (HOSPITAL_BASED_OUTPATIENT_CLINIC_OR_DEPARTMENT_OTHER): Payer: Medicare Other

## 2020-03-03 ENCOUNTER — Encounter (HOSPITAL_COMMUNITY): Payer: Self-pay | Admitting: Internal Medicine

## 2020-03-03 DIAGNOSIS — Z515 Encounter for palliative care: Secondary | ICD-10-CM

## 2020-03-03 DIAGNOSIS — C787 Secondary malignant neoplasm of liver and intrahepatic bile duct: Secondary | ICD-10-CM | POA: Diagnosis not present

## 2020-03-03 DIAGNOSIS — C3490 Malignant neoplasm of unspecified part of unspecified bronchus or lung: Secondary | ICD-10-CM

## 2020-03-03 DIAGNOSIS — C7971 Secondary malignant neoplasm of right adrenal gland: Secondary | ICD-10-CM | POA: Diagnosis not present

## 2020-03-03 DIAGNOSIS — I351 Nonrheumatic aortic (valve) insufficiency: Secondary | ICD-10-CM | POA: Diagnosis not present

## 2020-03-03 DIAGNOSIS — Z7189 Other specified counseling: Secondary | ICD-10-CM | POA: Diagnosis not present

## 2020-03-03 DIAGNOSIS — I340 Nonrheumatic mitral (valve) insufficiency: Secondary | ICD-10-CM | POA: Diagnosis not present

## 2020-03-03 DIAGNOSIS — C7931 Secondary malignant neoplasm of brain: Secondary | ICD-10-CM

## 2020-03-03 DIAGNOSIS — E876 Hypokalemia: Secondary | ICD-10-CM

## 2020-03-03 DIAGNOSIS — I4891 Unspecified atrial fibrillation: Secondary | ICD-10-CM

## 2020-03-03 LAB — BASIC METABOLIC PANEL
Anion gap: 11 (ref 5–15)
BUN: 12 mg/dL (ref 8–23)
CO2: 22 mmol/L (ref 22–32)
Calcium: 7.9 mg/dL — ABNORMAL LOW (ref 8.9–10.3)
Chloride: 102 mmol/L (ref 98–111)
Creatinine, Ser: 0.91 mg/dL (ref 0.61–1.24)
GFR, Estimated: >60 mL/min (ref >60)
Glucose, Bld: 104 mg/dL — ABNORMAL HIGH (ref 70–99)
Potassium: 3.2 mmol/L — ABNORMAL LOW (ref 3.5–5.1)
Sodium: 135 mmol/L (ref 135–145)

## 2020-03-03 LAB — CBC
HCT: 38.4 % — ABNORMAL LOW (ref 39.0–52.0)
Hemoglobin: 13.2 g/dL (ref 13.0–17.0)
MCH: 31.8 pg (ref 26.0–34.0)
MCHC: 34.4 g/dL (ref 30.0–36.0)
MCV: 92.5 fL (ref 80.0–100.0)
Platelets: 283 10*3/uL (ref 150–400)
RBC: 4.15 MIL/uL — ABNORMAL LOW (ref 4.22–5.81)
RDW: 14.2 % (ref 11.5–15.5)
WBC: 4.9 10*3/uL (ref 4.0–10.5)
nRBC: 0 % (ref 0.0–0.2)

## 2020-03-03 LAB — ECHOCARDIOGRAM COMPLETE
Area-P 1/2: 5.06 cm2
Height: 73 in
P 1/2 time: 543 msec
S' Lateral: 2.4 cm
Weight: 3061.75 oz

## 2020-03-03 LAB — MAGNESIUM: Magnesium: 1.7 mg/dL (ref 1.7–2.4)

## 2020-03-03 LAB — MRSA PCR SCREENING: MRSA by PCR: NEGATIVE

## 2020-03-03 MED ORDER — POTASSIUM CHLORIDE CRYS ER 20 MEQ PO TBCR
40.0000 meq | EXTENDED_RELEASE_TABLET | Freq: Once | ORAL | Status: AC
  Administered 2020-03-03: 40 meq via ORAL
  Filled 2020-03-03: qty 2

## 2020-03-03 MED ORDER — MAGNESIUM SULFATE 2 GM/50ML IV SOLN
2.0000 g | Freq: Once | INTRAVENOUS | Status: AC
  Administered 2020-03-03: 2 g via INTRAVENOUS
  Filled 2020-03-03: qty 50

## 2020-03-03 MED ORDER — DILTIAZEM HCL ER COATED BEADS 120 MG PO CP24: 120.0000 mg | ORAL_CAPSULE | Freq: Every day | ORAL | 11 refills | Status: AC

## 2020-03-03 MED ORDER — DILTIAZEM HCL 30 MG PO TABS
30.0000 mg | ORAL_TABLET | Freq: Three times a day (TID) | ORAL | Status: DC
  Administered 2020-03-03: 30 mg via ORAL
  Filled 2020-03-03: qty 1

## 2020-03-03 NOTE — Progress Notes (Signed)
*  PRELIMINARY RESULTS* Echocardiogram 2D Echocardiogram has been performed.  Lance Stafford 03/03/2020, 2:12 PM

## 2020-03-03 NOTE — Progress Notes (Signed)
Patient refused colace - reports has been having some loose stools so felt like he should hold off. Also reports taking Lipitor at hs and will take when he gets home tonight since pending discharge today. MD notified - will cont to monitor.

## 2020-03-03 NOTE — Discharge Summary (Signed)
Physician Discharge Summary  [name] URK:[id number] DOB: 06-07-1945 DOA: 03/02/2020  PCP: Lance Crigler, NP  Admit date: 03/02/2020  Discharge date: 03/03/2020  Admitted From:Home  Disposition:  Home  Recommendations for Outpatient Follow-up:  1. Follow up with PCP in 1-2 weeks 2. Follow-up with cardiology in the next 1-2 weeks to follow-up for A. fib evaluation and management 3. Continue on Cardizem 120 mg CD daily 4. Continue on aspirin 81 mg daily as well as other home medications  Home Health:None  Equipment/Devices:None  Discharge Condition:Stable  CODE STATUS: Full  Diet recommendation: Heart Healthy  Brief/Interim Summary: Per HPI: Lance Stafford is a 74 y.o. 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  He denies any palpitations, chest pain, shortness of breath, fevers, or chills.  He denies any specific abdominal pain as well.  -Patient was admitted with symptoms of nausea, vomiting, and weakness secondary to recent chemotherapy session.  He was also found to have new onset atrial fibrillation with RVR for which he was monitored overnight.  He is currently in sinus rhythm.  He has had work-up to include TSH level which was 2.3 and 2D echocardiogram shows LVEF 60-65% with mild MR and AR, no other acute findings.  He has been seen by cardiology and was noted to be in sinus rhythm on the morning of discharge with no further symptoms noted.  He is tolerating his diet and does not describe any chest pain, palpitations, or shortness of breath.  He is in stable condition discharged on Cardizem 120 mg CD daily and follow-up with cardiology in the outpatient setting.  He is not a candidate for anticoagulation and will remain on  aspirin 81 mg daily.  Discharge Diagnoses:  Active Problems:   New onset a-fib [facility])  Principal discharge diagnosis: New onset atrial fibrillation with RVR-resolved.  Discharge Instructions  Discharge Instructions    Amb Referral to Palliative Care   Complete by: As directed    Ambulatory referral to Cardiology   Complete by: As directed    F/u for afib with RVR-new onset.   Diet - low sodium heart healthy   Complete by: As directed    Increase activity slowly   Complete by: As directed      Allergies as of 03/03/2020   No Known Allergies     Medication List    TAKE these medications   aspirin EC 81 MG tablet Take 81 mg by mouth daily.   atorvastatin 40 MG tablet Commonly known as: LIPITOR Take 40 mg by mouth daily.   CARBOPLATIN IV Inject into the vein every 21 ( twenty-one) days.   dexamethasone 2 MG tablet Commonly known as: DECADRON Take 1 tablet (2 mg total) by mouth daily with breakfast.   diltiazem 120 MG 24 hr capsule Commonly known as: Cardizem CD Take 1 capsule (120 mg total) by mouth daily.   docusate sodium 100 MG capsule Commonly known as: COLACE Take 200 mg by mouth daily.   folic acid 1 MG tablet Commonly known as: FOLVITE Take 1 tablet (1 mg total) by mouth daily.   Lactulose 20 GM/30ML Soln Take 30 ml by mouth every 3 hours until bowel movement is had; then continue taking 30 ml by mouth once daily   lidocaine-prilocaine cream Commonly known  as: EMLA Apply a small amount to port a cath site and cover with plastic wrap 1 hour prior to chemotherapy appointments   Misc. Devices Misc Bedside Commode  Adjustable bedside table  Shower stool (not shower chair)  Lightweight wheelchair DX: C34.91- Right lung cancer   omeprazole 40 MG capsule Commonly known as: PRILOSEC Take 40 mg by mouth daily.   ondansetron 8 MG disintegrating tablet Commonly known as: Zofran ODT Take 1 tablet (8 mg total) by mouth every 8 (eight) hours as needed  for nausea or vomiting.   oxyCODONE 5 MG immediate release tablet Commonly known as: Oxy IR/ROXICODONE Take 2 tablets (10 mg total) by mouth every 6 (six) hours as needed for severe pain.   PEMEtrexed 500 mg/m2 in sodium chloride 0.9 % 100 mL Inject 500 mg/m2 into the vein every 21 ( twenty-one) days.   promethazine 25 MG tablet Commonly known as: PHENERGAN Take 1 tablet (25 mg total) by mouth every 6 (six) hours as needed for nausea or vomiting.   sodium chloride 0.9 % SOLN 50 mL with pembrolizumab 100 MG/4ML SOLN 2 mg/kg Inject 200 mg into the vein every 21 ( twenty-one) days.            Durable Medical Equipment  (From admission, onward)         Start     Ordered   03/03/20 1045  For home use only DME 3 n 1  Once        03/03/20 1044   03/03/20 1041  For home use only DME lightweight manual wheelchair with seat cushion  Once       Comments: Patient suffers from weakness which impairs their ability to perform daily activities like walking, and performing ADL's in the home.  A walker or cane will not resolve  issue with performing activities of daily living. A wheelchair will allow patient to safely perform daily activities. Patient is not able to propel themselves in the home using a standard weight wheelchair due to weakness after chemo treatments. Patient can self propel in the lightweight wheelchair. Length of need 99 months. Accessories: elevating leg rests (ELRs), wheel locks, extensions and anti-tippers.   03/03/20 1043          Follow-up Information    Lance Crigler, NP Follow up in 1 week(s).   Specialty: Nurse Practitioner Contact information: [place] [address] [phone]        [place] Follow up in 2 week(s).   Specialty: Cardiology Contact information: [address] [place] [phone]             No Known  Allergies  Consultations:  Cardiology   Procedures/Studies: [facility] Chest Port 1 View  Result Date: 03/02/2020 CLINICAL DATA:  Weakness and dehydration.  History of lung cancer. EXAM: PORTABLE CHEST 1 VIEW COMPARISON:  01/14/2020 FINDINGS: Left subclavian Port-A-Cath unchanged. Lungs are adequately inflated without focal airspace consolidation or effusion. Known right apical nodule not well visualized. Minimal biapical pleural thickening unchanged. Cardiomediastinal silhouette and remainder of the exam is unchanged. IMPRESSION: No acute cardiopulmonary disease. Electronically Signed   By: Marin Olp M.D.   On: 03/02/2020 11:47   ECHOCARDIOGRAM COMPLETE  Result Date: 03/03/2020    ECHOCARDIOGRAM REPORT   Patient Name:   Lance Stafford Date of Exam: 03/03/2020 Medical Rec #:  [id number]      Height:       73.0 in Accession #:  [id number]     Weight:       191.4 lb Date of Birth:  17-Feb-1946     BSA:          2.112 m Patient Age:    74 years       BP:           111/78 mmHg Patient Gender: M              HR:           85 bpm. Exam Location:  [facility] Procedure: 2D Echo Indications:    Atrial Fibrillation 427.31/l48.91  History:        Patient has no prior history of Echocardiogram examinations.                 Arrythmias:Atrial Fibrillation; Risk Factors:Dyslipidemia.                 Malignant neoplasm of right lung, Metastases to the liver,                 Prostate cancer ([place]) (From Hx).  Sonographer:    Alvino Chapel RCS Referring Phys: [id number] [place]  1. Left ventricular ejection fraction, by estimation, is 60 to 65%. The left ventricle has normal function. The left ventricle has no regional wall motion abnormalities. Left ventricular diastolic parameters are indeterminate.  2. Right ventricular systolic function is normal. The right ventricular size is normal.  3. The mitral valve is normal in structure. Mild mitral valve regurgitation.  4. The aortic valve is normal in  structure. Aortic valve regurgitation is mild.  5. The inferior vena cava is normal in size with greater than 50% respiratory variability, suggesting right atrial pressure of 3 mmHg. FINDINGS  Left Ventricle: Left ventricular ejection fraction, by estimation, is 60 to 65%. The left ventricle has normal function. The left ventricle has no regional wall motion abnormalities. The left ventricular internal cavity size was normal in size. There is  no left ventricular hypertrophy. Left ventricular diastolic parameters are indeterminate. Right Ventricle: The right ventricular size is normal. Right vetricular wall thickness was not assessed. Right ventricular systolic function is normal. Left Atrium: Left atrial size was normal in size. Right Atrium: Right atrial size was normal in size. Pericardium: There is no evidence of pericardial effusion. Mitral Valve: The mitral valve is normal in structure. Mild mitral valve regurgitation. Tricuspid Valve: The tricuspid valve is normal in structure. Tricuspid valve regurgitation is trivial. Aortic Valve: The aortic valve is normal in structure. Aortic valve regurgitation is mild. Aortic regurgitation PHT measures 543 msec. Pulmonic Valve: The pulmonic valve was not well visualized. Pulmonic valve regurgitation is not visualized. No evidence of pulmonic stenosis. Aorta: The aortic root is normal in size and structure. Venous: The inferior vena cava is normal in size with greater than 50% respiratory variability, suggesting right atrial pressure of 3 mmHg. IAS/Shunts: No atrial level shunt detected by color flow Doppler.  LEFT VENTRICLE PLAX 2D LVIDd:         3.50 cm  Diastology LVIDs:         2.40 cm  LV e' medial:    7.62 cm/s LV PW:         0.90 cm  LV E/e' medial:  7.5 LV IVS:        0.90 cm  LV e' lateral:   9.46 cm/s LVOT diam:     2.00 cm  LV E/e' lateral: 6.0 LV SV:  72 LV SV Index:   34 LVOT Area:     3.14 cm  RIGHT VENTRICLE TAPSE (M-mode): 1.4 cm LEFT ATRIUM              Index       RIGHT ATRIUM           Index LA diam:        3.00 cm 1.42 cm/m  RA Area:     15.40 cm LA Vol (A2C):   37.9 ml 17.95 ml/m RA Volume:   35.10 ml  16.62 ml/m LA Vol (A4C):   45.4 ml 21.50 ml/m LA Biplane Vol: 41.7 ml 19.75 ml/m  AORTIC VALVE LVOT Vmax:   132.00 cm/s LVOT Vmean:  84.600 cm/s LVOT VTI:    0.228 m AI PHT:      543 msec  AORTA Ao Root diam: 3.60 cm MITRAL VALVE MV Area (PHT): 5.06 cm     SHUNTS MV Decel Time: 150 msec     Systemic VTI:  0.23 m MV E velocity: 57.00 cm/s   Systemic Diam: 2.00 cm MV A velocity: 104.00 cm/s MV E/A ratio:  0.55 Dorris Carnes MD Electronically signed by Dorris Carnes MD Signature Date/Time: 03/03/2020/2:36:55 PM    Final     Discharge Exam: Vitals:   03/03/20 0400 03/03/20 1134  BP:    Pulse:    Resp:    Temp: 98.6 F (37 C) (!) 97.3 F (36.3 C)  SpO2:     Vitals:   03/03/20 0000 03/03/20 0100 03/03/20 0400 03/03/20 1134  BP:      Pulse: 74 76    Resp: 17 18    Temp: 97.9 F (36.6 C)  98.6 F (37 C) (!) 97.3 F (36.3 C)  TempSrc: Oral  Oral Oral  SpO2: 95% 93%    Weight:      Height:        General: Pt is alert, awake, not in acute distress Cardiovascular: RRR, S1/S2 +, no rubs, no gallops Respiratory: CTA bilaterally, no wheezing, no rhonchi Abdominal: Soft, NT, ND, bowel sounds + Extremities: no edema, no cyanosis    The results of significant diagnostics from this hospitalization (including imaging, microbiology, ancillary and laboratory) are listed below for reference.     Microbiology: Recent Results (from the past 240 hour(s))  Resp Panel by RT-PCR (Flu A&B, Covid) Nasopharyngeal Swab     Status: None   Collection Time: 03/02/20 10:21 AM   Specimen: Nasopharyngeal Swab; Nasopharyngeal(NP) swabs in vial transport medium  Result Value Ref Range Status   SARS Coronavirus 2 by RT PCR NEGATIVE NEGATIVE Final    Comment: (NOTE) SARS-CoV-2 target nucleic acids are NOT DETECTED.  The SARS-CoV-2 RNA is generally  detectable in upper respiratory specimens during the acute phase of infection. The lowest concentration of SARS-CoV-2 viral copies this assay can detect is 138 copies/mL. A negative result does not preclude SARS-Cov-2 infection and should not be used as the sole basis for treatment or other patient management decisions. A negative result may occur with  improper specimen collection/handling, submission of specimen other than nasopharyngeal swab, presence of viral mutation(s) within the areas targeted by this assay, and inadequate number of viral copies(<138 copies/mL). A negative result must be combined with clinical observations, patient history, and epidemiological information. The expected result is Negative.  Fact Sheet for Patients:  EntrepreneurPulse.com.au  Fact Sheet for Healthcare Providers:  IncredibleEmployment.be  This test is no t yet approved or cleared by the [place]  FDA and  has been authorized for detection and/or diagnosis of SARS-CoV-2 by FDA under an Emergency Use Authorization (EUA). This EUA will remain  in effect (meaning this test can be used) for the duration of the COVID-19 declaration under Section 564(b)(1) of the Act, 21 U.S.C.section 360bbb-3(b)(1), unless the authorization is terminated  or revoked sooner.       Influenza A by PCR NEGATIVE NEGATIVE Final   Influenza B by PCR NEGATIVE NEGATIVE Final    Comment: (NOTE) The Xpert Xpress SARS-CoV-2/FLU/RSV plus assay is intended as an aid in the diagnosis of influenza from Nasopharyngeal swab specimens and should not be used as a sole basis for treatment. Nasal washings and aspirates are unacceptable for Xpert Xpress SARS-CoV-2/FLU/RSV testing.  Fact Sheet for Patients: EntrepreneurPulse.com.au  Fact Sheet for Healthcare Providers: IncredibleEmployment.be  This test is not yet approved or cleared by the [place] FDA  and has been authorized for detection and/or diagnosis of SARS-CoV-2 by FDA under an Emergency Use Authorization (EUA). This EUA will remain in effect (meaning this test can be used) for the duration of the COVID-19 declaration under Section 564(b)(1) of the Act, 21 U.S.C. section 360bbb-3(b)(1), unless the authorization is terminated or revoked.  Performed at [facility], [address], [place], [place] [address]   MRSA PCR Screening     Status: None   Collection Time: 03/02/20  4:26 PM   Specimen: Nasal Mucosa; Nasopharyngeal  Result Value Ref Range Status   MRSA by PCR NEGATIVE NEGATIVE Final    Comment:        The GeneXpert MRSA Assay (FDA approved for NASAL specimens only), is one component of a comprehensive MRSA colonization surveillance program. It is not intended to diagnose MRSA infection nor to guide or monitor treatment for MRSA infections. Performed at [facility], [address], [place],  [address]      Labs: BNP (last 3 results) No results for input(s): BNP in the last 8760 hours. Basic Metabolic Panel: Recent Labs  Lab 02/26/20 1006 02/28/20 0753 03/02/20 1024 03/03/20 0423  NA 134* 133* 134* 135  K 3.6 3.3* 3.5 3.2*  CL 96* 99 96* 102  CO2 28 25 25 22   GLUCOSE 115* 118* 146* 104*  BUN 15 11 14 12   CREATININE 1.21 0.91 1.11 0.91  CALCIUM 8.3* 8.3* 8.5* 7.9*  MG 1.7  --   --  1.7   Liver Function Tests: Recent Labs  Lab 02/26/20 1006 02/28/20 0753 03/02/20 1024  AST 13* 22 35  ALT 21 19 39  ALKPHOS 115 114 145*  BILITOT 1.0 0.6 1.0  PROT 6.3* 6.4* 7.4  ALBUMIN 2.9* 2.9* 3.1*   No results for input(s): LIPASE, AMYLASE in the last 168 hours. No results for input(s): AMMONIA in the last 168 hours. CBC: Recent Labs  Lab 02/26/20 1006 02/28/20 0753 03/02/20 1024 03/03/20 0423  WBC 4.2 5.6 6.8 4.9  NEUTROABS 1.7 2.2 5.0  --   HGB 14.0 13.5 15.2 13.2  HCT 40.6 39.0 43.6 38.4*  MCV 90.6 91.3 90.8 92.5  PLT 196 275 303 283    Cardiac Enzymes: No results for input(s): CKTOTAL, CKMB, CKMBINDEX, TROPONINI in the last 168 hours. BNP: Invalid input(s): POCBNP CBG: No results for input(s): GLUCAP in the last 168 hours. D-Dimer No results for input(s): DDIMER in the last 72 hours. Hgb A1c No results for input(s): HGBA1C in the last 72 hours. Lipid Profile No results for input(s): CHOL, HDL, LDLCALC, TRIG, CHOLHDL, LDLDIRECT in  the last 72 hours. Thyroid function studies Recent Labs    03/02/20 1024  TSH 2.333   Anemia work up No results for input(s): VITAMINB12, FOLATE, FERRITIN, TIBC, IRON, RETICCTPCT in the last 72 hours. Urinalysis    Component Value Date/Time   COLORURINE YELLOW 03/02/2020 1144   APPEARANCEUR CLEAR 03/02/2020 1144   LABSPEC 1.013 03/02/2020 1144   PHURINE 5.0 03/02/2020 1144   GLUCOSEU NEGATIVE 03/02/2020 1144   HGBUR MODERATE (A) 03/02/2020 1144   BILIRUBINUR NEGATIVE 03/02/2020 1144   BILIRUBINUR small 07/06/2019 0848   KETONESUR 20 (A) 03/02/2020 1144   PROTEINUR 30 (A) 03/02/2020 1144   UROBILINOGEN negative (A) 07/06/2019 0848   NITRITE NEGATIVE 03/02/2020 1144   LEUKOCYTESUR NEGATIVE 03/02/2020 1144   Sepsis Labs Invalid input(s): PROCALCITONIN,  WBC,  LACTICIDVEN Microbiology Recent Results (from the past 240 hour(s))  Resp Panel by RT-PCR (Flu A&B, Covid) Nasopharyngeal Swab     Status: None   Collection Time: 03/02/20 10:21 AM   Specimen: Nasopharyngeal Swab; Nasopharyngeal(NP) swabs in vial transport medium  Result Value Ref Range Status   SARS Coronavirus 2 by RT PCR NEGATIVE NEGATIVE Final    Comment: (NOTE) SARS-CoV-2 target nucleic acids are NOT DETECTED.  The SARS-CoV-2 RNA is generally detectable in upper respiratory specimens during the acute phase of infection. The lowest concentration of SARS-CoV-2 viral copies this assay can detect is 138 copies/mL. A negative result does not preclude SARS-Cov-2 infection and should not be used as the sole basis  for treatment or other patient management decisions. A negative result may occur with  improper specimen collection/handling, submission of specimen other than nasopharyngeal swab, presence of viral mutation(s) within the areas targeted by this assay, and inadequate number of viral copies(<138 copies/mL). A negative result must be combined with clinical observations, patient history, and epidemiological information. The expected result is Negative.  Fact Sheet for Patients:  EntrepreneurPulse.com.au  Fact Sheet for Healthcare Providers:  IncredibleEmployment.be  This test is no t yet approved or cleared by the [place] FDA and  has been authorized for detection and/or diagnosis of SARS-CoV-2 by FDA under an Emergency Use Authorization (EUA). This EUA will remain  in effect (meaning this test can be used) for the duration of the COVID-19 declaration under Section 564(b)(1) of the Act, 21 U.S.C.section 360bbb-3(b)(1), unless the authorization is terminated  or revoked sooner.       Influenza A by PCR NEGATIVE NEGATIVE Final   Influenza B by PCR NEGATIVE NEGATIVE Final    Comment: (NOTE) The Xpert Xpress SARS-CoV-2/FLU/RSV plus assay is intended as an aid in the diagnosis of influenza from Nasopharyngeal swab specimens and should not be used as a sole basis for treatment. Nasal washings and aspirates are unacceptable for Xpert Xpress SARS-CoV-2/FLU/RSV testing.  Fact Sheet for Patients: EntrepreneurPulse.com.au  Fact Sheet for Healthcare Providers: IncredibleEmployment.be  This test is not yet approved or cleared by the [place] FDA and has been authorized for detection and/or diagnosis of SARS-CoV-2 by FDA under an Emergency Use Authorization (EUA). This EUA will remain in effect (meaning this test can be used) for the duration of the COVID-19 declaration under Section 564(b)(1) of the Act, 21  U.S.C. section 360bbb-3(b)(1), unless the authorization is terminated or revoked.  Performed at [facility], [address], [place], [place] [address]   MRSA PCR Screening     Status: None   Collection Time: 03/02/20  4:26 PM   Specimen: Nasal Mucosa; Nasopharyngeal  Result Value Ref Range Status  MRSA by PCR NEGATIVE NEGATIVE Final    Comment:        The GeneXpert MRSA Assay (FDA approved for NASAL specimens only), is one component of a comprehensive MRSA colonization surveillance program. It is not intended to diagnose MRSA infection nor to guide or monitor treatment for MRSA infections. Performed at [facility], [address], [place], [place] [address]      Time coordinating discharge: 35 minutes  SIGNED:   Rodena Goldmann, DO [facility] 03/03/2020, 2:50 PM  If 7PM-7AM, please contact night-coverage [email or web address]

## 2020-03-03 NOTE — TOC Transition Note (Signed)
Transition of Care [facility]) - CM/SW Discharge Note   Patient Details  Name: Lance Stafford MRN: [id number] Date of Birth: 1946-01-21  Transition of Care [facility]) CM/SW Contact:  Boneta Lucks, RN Phone Number: 03/03/2020, 10:52 AM   Clinical Narrative:   Patient admitted in OBS for New onset a-fib. Cancer patient, lives at home with his wife. Patient has a need for wheel chair and 3n1. Referral given to [facility] with [employer]. OBS forms signed and given.     Final next level of care: Home/Self Care Barriers to Discharge: Continued Medical Work up   Patient Goals and CMS Choice Patient states their goals for this hospitalization and ongoing recovery are:: to go home. CMS Medicare.gov Compare Post Acute Care list provided to:: Patient Choice offered to / list presented to : Patient         Discharge Plan and Services     Post Acute Care Choice: Durable Medical Equipment          DME Arranged: Walker rolling with seat, Bedside commode DME Agency: [employer] Date DME Agency Contacted: 03/03/20 Time DME Agency Contacted: [phone] Representative spoke with at DME Agency: Barbaraann Rondo

## 2020-03-03 NOTE — Consult Note (Addendum)
Cardiology Consult    Patient ID: Lance Stafford; [id number]; May 05, 1945   Admit date: 03/02/2020 Date of Consult: 03/03/2020  Primary Care Provider: [name], NP Primary Cardiologist: New to [facility] - Dr. Harrington Challenger  Patient Profile    Lance Stafford is a 74 y.o. 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 Manuella Ghazi.   History of Present Illness    Lance Stafford presented to [facility] ED on 03/02/2020 for evaluation of nausea, vomiting and diarrhea occurring since his last chemotherapy session session on 02/28/2020. He reports having nausea and vomiting since starting chemotherapy and anitemetic therapy has not been helpful thus far. His diarrhea was worse on the day of admission than previously, therefore he came to the ED for evaluation. He denies any chest pain or palpitations. Was unaware of his arrhythmia. No recent orthopnea, PND or edema. Unaware of any personal history of CAD, CHF or cardiac arrhythmias. No known family history of cardiac issues.   Initial labs showed WBC 6.8, Hgb 15.2, platelets 303, Na+ 134, K+ 3.5 and creatinine 1.11. Albumin 3.1. AST 35, ALT 39 and Alk Phos 145. TSH 2.333. Mg 1.7. Initial HS Troponin with repeat of 11. COVID negative. CXR with no acute cardiopulmonary disease. EKG showed atrial fibrillation with RVR, HR 156 with nonspecific ST abnormality along lateral leads.   He initially received intermittent doses of IV Lopressor and was mentioned in the notes to start PO Cardizem but this has not yet been ordered. He did convert back to NSR yesterday afternoon and has been in NSR since with brief episodes of sinus tachycardia overnight. Repeat labs this AM show K+ is low at 3.2 and Mg 1.7.  Past Medical History:  Diagnosis Date  . Arthritis   . Chronic low  back pain    truck driver  . GERD (gastroesophageal reflux disease)   . Hyperlipidemia   . 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 Nocturia   . Port-A-Cath in place 01/10/2020  . Prostate cancer ([place]) UROLOGIST-  DR WRENN/  ONCOLOGIST-  DR MANNING   dx 02/ 2017via TRUSPbx---  Stage T1c,  Gleason 3+3,  PSA 11.9  . Wears glasses   . Wears partial dentures    upper and lower    Past Surgical History:  Procedure Laterality Date  . CATARACT EXTRACTION W/ INTRAOCULAR LENS  IMPLANT, BILATERAL  2015  . CHOLECYSTECTOMY    . CYSTOSCOPY  09/30/2016   Procedure: CYSTOSCOPY;  Surgeon: Irine Seal, MD;  [facility];  Service: Urology;;  no seeds found in bladder  . PORTACATH PLACEMENT Left 01/14/2020   Procedure: INSERTION PORT-A-CATH;  Surgeon: Aviva Signs, MD;  Location: [facility];  Service: General;  Laterality: Left;  . RADIOACTIVE SEED IMPLANT N/A 09/30/2016   Procedure: RADIOACTIVE SEED IMPLANT/BRACHYTHERAPY IMPLANT, SPACE OAR;  Surgeon: Irine Seal, MD;  Location: [facility];  Service: Urology;  Laterality: N/A;  70 seeds implanted  . SPERMATOCELECTOMY Left 01/30/2015   Procedure: SPERMATOCELECTOMY;  Surgeon: Irine Seal, MD;  Location: [facility];  Service: Urology;  Laterality: Left;     Home Medications:  Prior to Admission medications   Medication Sig Start Date End Date Taking? Authorizing Provider  aspirin EC 81 MG  tablet Take 81 mg by mouth daily.     [provider]  atorvastatin (LIPITOR) 40 MG tablet Take 40 mg by mouth daily.    [provider]  CARBOPLATIN IV Inject into the vein every 21 ( twenty-one) days. 01/17/20   [provider]  dexamethasone (DECADRON) 2 MG tablet Take 1 tablet (2 mg total) by mouth daily with breakfast. 02/26/20   [name], MD  docusate sodium (COLACE) 100 MG capsule Take 200 mg by mouth daily.    [provider]  folic acid (FOLVITE) 1  MG tablet Take 1 tablet (1 mg total) by mouth daily. 01/03/20   [name], MD  Lactulose 20 GM/30ML SOLN Take 30 ml by mouth every 3 hours until bowel movement is had; then continue taking 30 ml by mouth once daily 01/18/20   [name], MD  lidocaine-prilocaine (EMLA) cream Apply a small amount to port a cath site and cover with plastic wrap 1 hour prior to chemotherapy appointments 01/10/20   [name], MD  Misc. Devices MISC Bedside Commode  Adjustable bedside table  Shower stool (not shower chair)  Lightweight wheelchair DX: C34.91- Right lung cancer 02/13/20   [name], MD  omeprazole (PRILOSEC) 40 MG capsule Take 40 mg by mouth daily.    [provider]  ondansetron (ZOFRAN ODT) 8 MG disintegrating tablet Take 1 tablet (8 mg total) by mouth every 8 (eight) hours as needed for nausea or vomiting. 02/14/20   [name], MD  oxyCODONE (OXY IR/ROXICODONE) 5 MG immediate release tablet Take 2 tablets (10 mg total) by mouth every 6 (six) hours as needed for severe pain. 02/26/20   [name], MD  PEMEtrexed 500 mg/m2 in sodium chloride 0.9 % 100 mL Inject 500 mg/m2 into the vein every 21 ( twenty-one) days. 12/18/19   [provider]  promethazine (PHENERGAN) 25 MG tablet Take 1 tablet (25 mg total) by mouth every 6 (six) hours as needed for nausea or vomiting. 02/28/20   [name], MD  sodium chloride 0.9 % SOLN 50 mL with pembrolizumab 100 MG/4ML SOLN 2 mg/kg Inject 200 mg into the vein every 21 ( twenty-one) days. 01/17/20   [provider]    Inpatient Medications: Scheduled Meds: . aspirin EC  81 mg Oral Daily  . atorvastatin  40 mg Oral Daily  . Chlorhexidine Gluconate Cloth  6 each Topical Q0600  . dexamethasone  2 mg Oral Q breakfast  . diltiazem  30 mg Oral Q8H  . docusate sodium  200 mg Oral Daily  . enoxaparin (LOVENOX) injection  40 mg Subcutaneous Q24H  . folic acid  1 mg Oral Daily    . pantoprazole (PROTONIX) IV  40 mg Intravenous Q24H  . potassium chloride  40 mEq Oral Once   Continuous Infusions: . magnesium sulfate bolus IVPB     PRN Meds: acetaminophen **OR** acetaminophen, ondansetron **OR** ondansetron (ZOFRAN) IV, oxyCODONE  Allergies:   No Known Allergies  Social History:   Social History   Socioeconomic History  . Marital status: Married    Spouse name: Not on file  . Number of children: 2  . Years of education: Not on file  . Highest education level: Not on file  Occupational History  . Occupation: truck Geophysicist/field seismologist  Tobacco Use  . Smoking status: Former Smoker    Packs/day: 2.00    Years: 33.00    Pack years: 66.00    Types: Cigarettes    Quit date: 01/23/1991  Years since quitting: 29.1  . Smokeless tobacco: Never Used  Vaping Use  . Vaping Use: Never used  Substance and Sexual Activity  . Alcohol use: No  . Drug use: No  . Sexual activity: Yes  Other Topics Concern  . Not on file  Social History Narrative  . Not on file   Social Determinants of Health   Financial Resource Strain: Low Risk   . Difficulty of Paying Living Expenses: Not hard at all  Food Insecurity: No Food Insecurity  . Worried About Charity fundraiser in the Last Year: Never true  . Ran Out of Food in the Last Year: Never true  Transportation Needs: No Transportation Needs  . Lack of Transportation (Medical): No  . Lack of Transportation (Non-Medical): No  Physical Activity: Sufficiently Active  . Days of Exercise per Week: 1 day  . Minutes of Exercise per Session: 150+ min  Stress: No Stress Concern Present  . Feeling of Stress : Only a little  Social Connections: Socially Integrated  . Frequency of Communication with Friends and Family: More than three times a week  . Frequency of Social Gatherings with Friends and Family: Three times a week  . Attends Religious Services: More than 4 times per year  . Active Member of Clubs or Organizations: Yes  .  Attends Archivist Meetings: More than 4 times per year  . Marital Status: Married  Human resources officer Violence: Not At Risk  . Fear of Current or Ex-Partner: No  . Emotionally Abused: No  . Physically Abused: No  . Sexually Abused: No     Family History:    Family History  Problem Relation Age of Onset  . Diabetes Mother   . Stroke Mother   . Throat cancer Mother   . Skin cancer Sister   . Prostate cancer Brother   . Cancer Paternal Grandmother   . Clotting disorder Daughter       Review of Systems    General:  No chills, fever, night sweats or weight changes. Positive for fatigue.  Cardiovascular:  No chest pain, dyspnea on exertion, edema, orthopnea, palpitations, paroxysmal nocturnal dyspnea. Dermatological: No rash, lesions/masses Respiratory: No cough, dyspnea Urologic: No hematuria, dysuria Abdominal:   No diarrhea, bright red blood per rectum, melena, or hematemesis. Positive for nausea and vomiting.  Neurologic:  No visual changes, wkns, changes in mental status. All other systems reviewed and are otherwise negative except as noted above.  Physical Exam/Data    Vitals:   03/02/20 2300 03/03/20 0000 03/03/20 0100 03/03/20 0400  BP:      Pulse: 74 74 76   Resp: (!) _0 Temp:  97.9 F (36.6 C)  98.6 F (37 C)  TempSrc:  Oral  Oral  SpO2: 93% 95% 93%   Weight:      Height:        Intake/Output Summary (Last 24 hours) at 03/03/2020 0850 Last data filed at 03/03/2020 0100 Gross per 24 hour  Intake 2390.61 ml  Output --  Net 2390.61 ml   Filed Weights   03/02/20 1015 03/02/20 1613  Weight: 88.5 kg 86.8 kg   Body mass index is 25.25 kg/m.   General: Pleasant male appearing in NAD Psych: Normal affect. Neuro: Alert and oriented X 3. Moves all extremities spontaneously. HEENT: Normal  Neck: Supple without bruits or JVD. Lungs:  Resp regular and unlabored, CTA without wheezing or rales. Heart: RRR no s3, s4, or murmurs.  Abdomen:  Soft, non-tender, non-distended, BS + x 4.  Extremities: No clubbing, cyanosis or lower extremity edema. DP/Lance Stafford/Radials 2+ and equal bilaterally.   EKG:  The EKG was personally reviewed and demonstrates: Atrial fibrillation with RVR, HR 156 with nonspecific ST abnormality along lateral leads.   Telemetry:  Telemetry was personally reviewed and demonstrates: Converted to NSR on 11/21 around 1600. In NSR since with HR in 70's to 80's. Episodes of sinus tachycardia around 0400 with HR into 120's.   Labs/Studies     Relevant CV Studies:  Echocardiogram: Pending  Laboratory Data:  Chemistry Recent Labs  Lab 02/28/20 0753 03/02/20 1024 03/03/20 0423  NA 133* 134* 135  K 3.3* 3.5 3.2*  CL 99 96* 102  CO2 _0 GLUCOSE 118* 146* 104*  BUN _1 CREATININE 0.91 1.11 0.91  CALCIUM 8.3* 8.5* 7.9*  GFRNONAA >60 >60 >60  ANIONGAP _2 Recent Labs  Lab 02/26/20 1006 02/28/20 0753 03/02/20 1024  PROT 6.3* 6.4* 7.4  ALBUMIN 2.9* 2.9* 3.1*  AST 13* 22 35  ALT 21 19 39  ALKPHOS 115 114 145*  BILITOT 1.0 0.6 1.0   Hematology Recent Labs  Lab 02/28/20 0753 03/02/20 1024 03/03/20 0423  WBC 5.6 6.8 4.9  RBC 4.27 4.80 4.15*  HGB 13.5 15.2 13.2  HCT 39.0 43.6 38.4*  MCV 91.3 90.8 92.5  MCH 31.6 31.7 31.8  MCHC 34.6 34.9 34.4  RDW 14.2 14.2 14.2  PLT 275 303 283   Cardiac EnzymesNo results for input(s): TROPONINI in the last 168 hours. No results for input(s): TROPIPOC in the last 168 hours.  BNPNo results for input(s): BNP, PROBNP in the last 168 hours.  DDimer No results for input(s): DDIMER in the last 168 hours.  Radiology/Studies:  DG Chest Port 1 View  Result Date: 03/02/2020 CLINICAL DATA:  Weakness and dehydration.  History of lung cancer. EXAM: PORTABLE CHEST 1 VIEW COMPARISON:  01/14/2020 FINDINGS: Left subclavian Port-A-Cath unchanged. Lungs are adequately inflated without focal airspace consolidation or effusion. Known right apical nodule not  well visualized. Minimal biapical pleural thickening unchanged. Cardiomediastinal silhouette and remainder of the exam is unchanged. IMPRESSION: No acute cardiopulmonary disease. Electronically Signed   By: Marin Olp M.D.   On: 03/02/2020 11:47     Assessment & Plan    1. New-Onset Atrial Fibrillation with RVR - Presented with nausea, vomiting and diarrhea following his chemotherapy session and found to be in atrial fibrillation with RVR and HR in the 150's. Says his HR is sometimes in the 120's when undergoing treatment but no persistent tachycardia. Was unaware of his arrhythmia and no prior history of this. He did convert back to NSR yesterday afternoon and has been in NSR since.  - TSH WNL. K+ and Mg are low with supplementation ordered. Suspect his arrhythmia was triggered by dehydration and could possibly be due to one of his chemotherapy agents (Pembrolizumab) as outlined below.  - An echocardiogram is pending to assess EF and wall motion. For now, would continue short-acting Cardizem 35m Q8H given soft BP. Can consolidate to Cardizem CD 1235mdaily at discharge if BP allows and EF normal by echo. If EF is reduced, would transition to Toprol-XL.  - This patients CHA2DS2-VASc Score and unadjusted Ischemic Stroke Rate (% per year) is equal to 2.2 % stroke rate/year from a score of 2 (Aortic Plaque, Age). Increased thromboembolic risk reviewed with the patient and his family. Would not  anticipate starting anticoagulation at this time in the setting of his Stage 4 Lung Cancer and known brain mets. Will review further with Dr. Harrington Challenger. Remains on ASA 29m daily.   2. Stage 4 Lung Cancer with with mets to Liver, Adrenal gland and Brain - Currently undergoing chemotherapy and being followed by Dr. KDelton Coombes Current medical therapy includes Carboplatin, Pemetrexed and Pembrolizumab by review notes. In review of Up-To-Date, Pembrolizumab can have cardiovascular side-effects including 4-11% of cardiac  arrhythmia and 11-15% of peripheral edema.  - Echocardiogram is pending to assess for any structural abnormalities.   3. Hypokalemia/Hypomagnesemia - K+ 3.2 this AM and Mg 1.7. Try to keep K+ ~ 4.0 and Mg ~ 2.0. Supplementation ordered.     For questions or updates, please contact C[place] MadisonPlease consult [email or web address] for contact info under Cardiology/STEMI.  Signed, BErma Heritage PA-C 03/03/2020, 8:50 AM Pager: [phone] Patient seen and exmained  I agree with findings as noted above by B Strader  Lance Stafford is comfortable   Not SOB   Eager to go home  On exam:   Tele with SR   Neck:  JVP is normal Lungs are relatively clear to auscul Cardiac exam:   RRR  N oS3  No signif murmrus  Ext are without edema  Impression  PAF   Currently back in SR   Note echo is pending  If LVEf normal would switch to Cardiazem CD    If not then would transition to Toprol  Lance Stafford with increased CHADSVASc of 2  Given Stage 4 Lung CA though with brain met, would recomm continued ASA

## 2020-03-03 NOTE — Progress Notes (Signed)
Nsg Discharge Note  Admit Date:  03/02/2020 Discharge date: 03/03/2020   Lance Stafford to be D/C'd home per MD order.  AVS completed.  Copy for chart, and copy for patient signed, and dated. Patient/caregiver able to verbalize understanding.  Discharge Medication: Allergies as of 03/03/2020   No Known Allergies     Medication List    TAKE these medications   aspirin EC 81 MG tablet Take 81 mg by mouth daily.   atorvastatin 40 MG tablet Commonly known as: LIPITOR Take 40 mg by mouth daily.   CARBOPLATIN IV Inject into the vein every 21 ( twenty-one) days.   dexamethasone 2 MG tablet Commonly known as: DECADRON Take 1 tablet (2 mg total) by mouth daily with breakfast.   diltiazem 120 MG 24 hr capsule Commonly known as: Cardizem CD Take 1 capsule (120 mg total) by mouth daily.   docusate sodium 100 MG capsule Commonly known as: COLACE Take 200 mg by mouth daily.   folic acid 1 MG tablet Commonly known as: FOLVITE Take 1 tablet (1 mg total) by mouth daily.   Lactulose 20 GM/30ML Soln Take 30 ml by mouth every 3 hours until bowel movement is had; then continue taking 30 ml by mouth once daily   lidocaine-prilocaine cream Commonly known as: EMLA Apply a small amount to port a cath site and cover with plastic wrap 1 hour prior to chemotherapy appointments   Misc. Devices Misc Bedside Commode  Adjustable bedside table  Shower stool (not shower chair)  Lightweight wheelchair DX: C34.91- Right lung cancer   omeprazole 40 MG capsule Commonly known as: PRILOSEC Take 40 mg by mouth daily.   ondansetron 8 MG disintegrating tablet Commonly known as: Zofran ODT Take 1 tablet (8 mg total) by mouth every 8 (eight) hours as needed for nausea or vomiting.   oxyCODONE 5 MG immediate release tablet Commonly known as: Oxy IR/ROXICODONE Take 2 tablets (10 mg total) by mouth every 6 (six) hours as needed for severe pain.   PEMEtrexed 500 mg/m2 in sodium chloride 0.9 %  100 mL Inject 500 mg/m2 into the vein every 21 ( twenty-one) days.   promethazine 25 MG tablet Commonly known as: PHENERGAN Take 1 tablet (25 mg total) by mouth every 6 (six) hours as needed for nausea or vomiting.   sodium chloride 0.9 % SOLN 50 mL with pembrolizumab 100 MG/4ML SOLN 2 mg/kg Inject 200 mg into the vein every 21 ( twenty-one) days.            Durable Medical Equipment  (From admission, onward)         Start     Ordered   03/03/20 1045  For home use only DME 3 n 1  Once        03/03/20 1044   03/03/20 1041  For home use only DME lightweight manual wheelchair with seat cushion  Once       Comments: Patient suffers from weakness which impairs their ability to perform daily activities like walking, and performing ADL's in the home.  A walker or cane will not resolve  issue with performing activities of daily living. A wheelchair will allow patient to safely perform daily activities. Patient is not able to propel themselves in the home using a standard weight wheelchair due to weakness after chemo treatments. Patient can self propel in the lightweight wheelchair. Length of need 99 months. Accessories: elevating leg rests (ELRs), wheel locks, extensions and anti-tippers.   03/03/20 1043  Discharge Assessment: Vitals:   03/03/20 0400 03/03/20 1134  BP:    Pulse:    Resp:    Temp: 98.6 F (37 C) (!) 97.3 F (36.3 C)  SpO2:     Skin clean, dry and intact without evidence of skin break down, no evidence of skin tears noted. IV catheter discontinued intact. Site without signs and symptoms of complications - no redness or edema noted at insertion site, patient denies c/o pain - only slight tenderness at site.  Dressing with slight pressure applied.  D/c Instructions-Education: Discharge instructions given to patient/family with verbalized understanding. D/c education completed with patient/family including follow up instructions, medication list, d/c  activities limitations if indicated, with other d/c instructions as indicated by MD - patient able to verbalize understanding, all questions fully answered. Patient instructed to return to ED, call 911, or call MD for any changes in condition.  Patient escorted via [place], and D/C home via private auto.  Felicie Morn, RN 03/03/2020 3:35 PM

## 2020-03-03 NOTE — TOC Progression Note (Signed)
Transition of Care [facility]) - Progression Note    Patient Details  Name: Lance Stafford MRN: [id number] Date of Birth: October 25, 1945  Transition of Care [facility]) CM/SW Contact  Boneta Lucks, RN Phone Number: 03/03/2020, 4:21 PM  Clinical Narrative:   Faxed new orders, notes and DC summary to [facility] in [place], [place]  For wheelchair and 3N1.     Expected Discharge Plan: Home/Self Care Barriers to Discharge: Continued Medical Work up  Expected Discharge Plan and Services Expected Discharge Plan: Home/Self Care     Post Acute Care Choice: Durable Medical Equipment Living arrangements for the past 2 months: Single Family Home Expected Discharge Date: 03/03/20               DME Arranged: Gilford Rile rolling with seat, Bedside commode DME Agency: [employer] Date DME Agency Contacted: 03/03/20 Time DME Agency Contacted: [id number] Representative spoke with at DME Agency: Barbaraann Rondo

## 2020-03-03 NOTE — Consult Note (Signed)
Consultation Note Date: 03/03/2020   Patient Name: Lance Stafford  DOB: October 05, 1945  MRN: [id number]  Age / Sex: 74 y.o., male  PCP: [name], NP Referring Physician: No att. providers found  Reason for Consultation: Establishing goals of care and Psychosocial/spiritual support  HPI/Patient Profile: 74 y.o. 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 fib with RVR, intractable nausea and vomiting 2/2 recent chemotherapy.   Clinical Assessment and Goals of Care: [name], is sitting up in the bed in his room.  He greets me making and mostly keeping eye contact.  He is alert and oriented, able to make his basic needs known.  He is a little thin, but not overly frail.  Present today at bedside is his wife of 15 years, Joycelyn Schmid.  We talked about what has brought him into the hospital.  I shared that it is not uncommon for people to have issues like this related to chemotherapy, and encouraged him to keep a good attitude.  We talk about healthcare power of attorney, see below. We talked in detail about CODE STATUS.  [name] states that several people have discussed this with him.  I share that we talk about this because it is so important to respect him as a person, what matters to him.  [name] states that she had encouraged to build to discuss this in the past, but he is now much more open to discussing the important things.  She states that they have talked about funeral arrangements also.  We talked about preferred place of death.  I reassure [name] that I do not believe that he is dying at this point, but respect for person means that we talk about what matters to him.  He is tearful at times, but not inappropriate by any means.   Thankfully, [name] is present and open to these discussions.  She shares that she sees the benefits and talking about what matters to Mr. Sharp.  At this point continue to treat the treatable, open to cancer treatments as offered.  He will likely discharge home today, no identified needs at this time.  The sharps live in [place], and unfortunately there is no outpatient palliative service in their area.  Conference with attending, bedside nursing staff, transition of care team related to patient condition, needs, goals of care.   HCPOA    NEXT OF KIN -wife of 54 years, [name].    SUMMARY OF RECOMMENDATIONS   At this point full scope/full code Continue cancer treatments as offered Rehospitalize as needed Continue goals of care discussions   Code Status/Advance Care Planning:  Full code -we talked about the concept of "treat the treatable but allowing natural death".  We talked about respect for person during these discussions.  I shared that what [name] wants today may be different next month or next  year.  Symptom Management:   Per hospitalist, no additional needs at this time.  Palliative Prophylaxis:   Frequent Pain Assessment and Oral Care  Additional Recommendations (Limitations, Scope, Preferences):  Full Scope Treatment  Psycho-social/Spiritual:   Desire for further Chaplaincy support:no  Additional Recommendations: Caregiving  Support/Resources and Education on Hospice  Prognosis:   Unable to determine, based on outcomes.  Based on response to chemotherapy/cancer treatments, functional status, nutritional status.  6 months or less would not be surprising based on lung cancer with metastatic burden.  Discharge Planning: Home.  No home health needs noted at this time.      Primary Diagnoses: Present on Admission: . New onset a-fib ([place])   I have reviewed the medical record, interviewed the patient and family, and examined the patient.  The following aspects are pertinent.  Past Medical History:  Diagnosis Date  . Arthritis   . Chronic low back pain    truck driver  . GERD (gastroesophageal reflux disease)   . Hyperlipidemia   . Lung cancer ([place])    stage IV non small cell lung ca  . Nocturia   . Port-A-Cath in place 01/10/2020  . Prostate cancer ([place]) UROLOGIST-  DR WRENN/  ONCOLOGIST-  DR MANNING   dx 02/ 2017via [facility]---  Stage T1c,  Gleason 3+3,  PSA 11.9  . Wears glasses   . Wears partial dentures    upper and lower   Social History   Socioeconomic History  . Marital status: Married    Spouse name: Not on file  . Number of children: 2  . Years of education: Not on file  . Highest education level: Not on file  Occupational History  . Occupation: truck Geophysicist/field seismologist  Tobacco Use  . Smoking status: Former Smoker    Packs/day: 2.00    Years: 33.00    Pack years: 66.00    Types: Cigarettes    Quit date: 01/23/1991    Years since quitting: 29.1  . Smokeless tobacco: Never Used  Vaping Use  . Vaping Use: Never used  Substance and Sexual Activity  . Alcohol use: No  . Drug use: No  . Sexual activity: Yes  Other Topics Concern  . Not on file  Social History Narrative  . Not on file   Social Determinants of Health   Financial Resource Strain: Low Risk   . Difficulty of Paying Living Expenses: Not hard at all  Food Insecurity: No Food Insecurity  . Worried About Charity fundraiser in the Last Year: Never true  . Ran Out of Food in the Last Year: Never true  Transportation Needs: No Transportation Needs  . Lack of Transportation (Medical): No  . Lack of Transportation (Non-Medical): No  Physical Activity: Sufficiently Active  . Days of Exercise per Week: 1 day  . Minutes of Exercise per Session: 150+ min  Stress: No Stress Concern Present  . Feeling of Stress : Only a little  Social Connections: Socially Integrated  . Frequency of Communication with Friends and Family: More than three times a  week  . Frequency of Social Gatherings with Friends and Family: Three times a week  . Attends Religious Services: More than 4 times per year  . Active Member of Clubs or Organizations: Yes  . Attends Archivist Meetings: More than 4 times per year  . Marital Status: Married   Family History  Problem Relation Age of Onset  . Diabetes Mother   . Stroke Mother   .  Throat cancer Mother   . Skin cancer Sister   . Prostate cancer Brother   . Cancer Paternal Grandmother   . Clotting disorder Daughter    Scheduled Meds: . aspirin EC  81 mg Oral Daily  . atorvastatin  40 mg Oral Daily  . Chlorhexidine Gluconate Cloth  6 each Topical Q0600  . dexamethasone  2 mg Oral Q breakfast  . diltiazem  30 mg Oral Q8H  . docusate sodium  200 mg Oral Daily  . enoxaparin (LOVENOX) injection  40 mg Subcutaneous Q24H  . folic acid  1 mg Oral Daily  . pantoprazole (PROTONIX) IV  40 mg Intravenous Q24H   Continuous Infusions: PRN Meds:.acetaminophen **OR** acetaminophen, ondansetron **OR** ondansetron (ZOFRAN) IV, oxyCODONE Medications Prior to Admission:  Prior to Admission medications   Medication Sig Start Date End Date Taking? Authorizing Provider  aspirin EC 81 MG tablet Take 81 mg by mouth daily.     [provider]  atorvastatin (LIPITOR) 40 MG tablet Take 40 mg by mouth daily.    [provider]  CARBOPLATIN IV Inject into the vein every 21 ( twenty-one) days. 01/17/20   [provider]  dexamethasone (DECADRON) 2 MG tablet Take 1 tablet (2 mg total) by mouth daily with breakfast. 02/26/20   [name], MD  diltiazem (CARDIZEM CD) 120 MG 24 hr capsule Take 1 capsule (120 mg total) by mouth daily. 03/03/20 03/03/21  Manuella Ghazi, Pratik D, DO  docusate sodium (COLACE) 100 MG capsule Take 200 mg by mouth daily.    [provider]  folic acid (FOLVITE) 1 MG tablet Take 1 tablet (1 mg total) by mouth daily. 01/03/20   [name], MD  Lactulose  20 GM/30ML SOLN Take 30 ml by mouth every 3 hours until bowel movement is had; then continue taking 30 ml by mouth once daily 01/18/20   [name], MD  lidocaine-prilocaine (EMLA) cream Apply a small amount to port a cath site and cover with plastic wrap 1 hour prior to chemotherapy appointments 01/10/20   [name], MD  Misc. Devices MISC Bedside Commode  Adjustable bedside table  Shower stool (not shower chair)  Lightweight wheelchair DX: C34.91- Right lung cancer 02/13/20   [name], MD  omeprazole (PRILOSEC) 40 MG capsule Take 40 mg by mouth daily.    [provider]  ondansetron (ZOFRAN ODT) 8 MG disintegrating tablet Take 1 tablet (8 mg total) by mouth every 8 (eight) hours as needed for nausea or vomiting. 02/14/20   [name], MD  oxyCODONE (OXY IR/ROXICODONE) 5 MG immediate release tablet Take 2 tablets (10 mg total) by mouth every 6 (six) hours as needed for severe pain. 02/26/20   [name], MD  PEMEtrexed 500 mg/m2 in sodium chloride 0.9 % 100 mL Inject 500 mg/m2 into the vein every 21 ( twenty-one) days. 12/18/19   [provider]  promethazine (PHENERGAN) 25 MG tablet Take 1 tablet (25 mg total) by mouth every 6 (six) hours as needed for nausea or vomiting. 02/28/20   [name], MD  sodium chloride 0.9 % SOLN 50 mL with pembrolizumab 100 MG/4ML SOLN 2 mg/kg Inject 200 mg into the vein every 21 ( twenty-one) days. 01/17/20   [provider]   No Known Allergies Review of Systems  Unable to perform ROS: Other    Physical Exam Vitals and nursing note reviewed.  Constitutional:      General: He is not in acute distress.    Appearance: He  is normal weight. He is not ill-appearing.  HENT:     Mouth/Throat:     Mouth: Mucous membranes are moist.  Cardiovascular:     Rate and Rhythm: Normal rate.  Pulmonary:     Effort: Pulmonary effort is normal. No respiratory distress.  Skin:    General:  Skin is dry.  Neurological:     Mental Status: He is alert and oriented to person, place, and time.  Psychiatric:        Mood and Affect: Mood normal.        Behavior: Behavior normal.        Thought Content: Thought content normal.     Vital Signs: BP (!) 96/55   Pulse 76   Temp (!) 97.3 F (36.3 C) (Oral)   Resp 18   Ht 6\' 1"  (1.854 m)   Wt 86.8 kg   SpO2 93%   BMI 25.25 kg/m  Pain Scale: 0-10 POSS *See Group Information*: 1-Acceptable,Awake and alert Pain Score: 4    SpO2: SpO2: 93 % O2 Device:SpO2: 93 % O2 Flow Rate: .   IO: Intake/output summary:   Intake/Output Summary (Last 24 hours) at 03/03/2020 1601 Last data filed at 03/03/2020 0100 Gross per 24 hour  Intake 1008.66 ml  Output --  Net 1008.66 ml    LBM:   Baseline Weight: Weight: 88.5 kg Most recent weight: Weight: 86.8 kg     Palliative Assessment/Data:   Flowsheet Rows     Most Recent Value  Intake Tab  Referral Department Hospitalist  Unit at Time of Referral Intermediate Care Unit  Palliative Care Primary Diagnosis Cardiac  Date Notified 03/02/20  Palliative Care Type New Palliative care  Reason for referral Clarify Goals of Care  Date of Admission 03/02/20  Date first seen by Palliative Care 03/03/20  # of days Palliative referral response time 1 Day(s)  # of days IP prior to Palliative referral 0  Clinical Assessment  Palliative Performance Scale Score 60%  Pain Max last 24 hours Not able to report  Pain Min Last 24 hours Not able to report  Dyspnea Max Last 24 Hours Not able to report  Dyspnea Min Last 24 hours Not able to report  Psychosocial & Spiritual Assessment  Palliative Care Outcomes      Time In: 1450 Time Out: 1520 Time Total: 30 minutes  Greater than 50%  of this time was spent counseling and coordinating care related to the above assessment and plan.  Signed by: Drue Novel, NP   Please contact Palliative Medicine Team phone at [phone] for questions and  concerns.  For individual provider: See Shea Evans

## 2020-03-03 NOTE — Care Management Obs Status (Signed)
[place] NOTIFICATION   Patient Details  Name: Abdel Effinger MRN: [id number] Date of Birth: [birth year]   Medicare Observation Status Notification Given:  Yes    Boneta Lucks, RN 03/03/2020, 10:56 AM

## 2020-03-04 ENCOUNTER — Other Ambulatory Visit (HOSPITAL_COMMUNITY): Payer: Self-pay

## 2020-03-04 MED ORDER — DEXAMETHASONE 2 MG PO TABS: 2.0000 mg | ORAL_TABLET | Freq: Two times a day (BID) | ORAL | 1 refills | Status: DC

## 2020-03-04 MED ORDER — METOCLOPRAMIDE HCL 10 MG PO TABS: 10.0000 mg | ORAL_TABLET | Freq: Three times a day (TID) | ORAL | 1 refills | Status: DC

## 2020-03-04 NOTE — Progress Notes (Signed)
Patient wife called in stating patient is unable to keep down any food or liquids. Has been attempting to take Zofran and Promethazine to help with the nausea.  Patient is also wearing a Sancuso patch. Spoke with MD and received orders to stop taking Zofran and Promethazine.  Start taking Reglan 10 mg twice daily, Increase Decadron to two tablets daily and continue to wear the Sancuso patch.  Wife and patient made aware of MD orders.

## 2020-03-05 ENCOUNTER — Encounter (HOSPITAL_COMMUNITY): Payer: Self-pay

## 2020-03-05 ENCOUNTER — Emergency Department (HOSPITAL_COMMUNITY)
Admission: EM | Admit: 2020-03-05 | Discharge: 2020-03-06 | Disposition: A | Discharge status: Home / Self Care | Payer: Medicare Other | Attending: Emergency Medicine | Admitting: Emergency Medicine

## 2020-03-05 ENCOUNTER — Other Ambulatory Visit: Payer: Self-pay

## 2020-03-05 ENCOUNTER — Encounter (HOSPITAL_COMMUNITY): Payer: Self-pay | Admitting: Emergency Medicine

## 2020-03-05 DIAGNOSIS — Z7982 Long term (current) use of aspirin: Secondary | ICD-10-CM | POA: Diagnosis not present

## 2020-03-05 DIAGNOSIS — Z8546 Personal history of malignant neoplasm of prostate: Secondary | ICD-10-CM | POA: Diagnosis not present

## 2020-03-05 DIAGNOSIS — R112 Nausea with vomiting, unspecified: Secondary | ICD-10-CM | POA: Diagnosis present

## 2020-03-05 DIAGNOSIS — Z87891 Personal history of nicotine dependence: Secondary | ICD-10-CM | POA: Diagnosis not present

## 2020-03-05 DIAGNOSIS — Z85118 Personal history of other malignant neoplasm of bronchus and lung: Secondary | ICD-10-CM | POA: Diagnosis not present

## 2020-03-05 DIAGNOSIS — R000 Tachycardia, unspecified: Secondary | ICD-10-CM | POA: Insufficient documentation

## 2020-03-05 DIAGNOSIS — R197 Diarrhea, unspecified: Secondary | ICD-10-CM | POA: Insufficient documentation

## 2020-03-05 LAB — CBC
HCT: 40.4 % (ref 39.0–52.0)
Hemoglobin: 13.9 g/dL (ref 13.0–17.0)
MCH: 31.2 pg (ref 26.0–34.0)
MCHC: 34.4 g/dL (ref 30.0–36.0)
MCV: 90.8 fL (ref 80.0–100.0)
Platelets: 208 10*3/uL (ref 150–400)
RBC: 4.45 MIL/uL (ref 4.22–5.81)
RDW: 14 % (ref 11.5–15.5)
WBC: 2.5 10*3/uL — ABNORMAL LOW (ref 4.0–10.5)
nRBC: 0 % (ref 0.0–0.2)

## 2020-03-05 LAB — COMPREHENSIVE METABOLIC PANEL
ALT: 35 U/L (ref 0–44)
AST: 32 U/L (ref 15–41)
Albumin: 3.1 g/dL — ABNORMAL LOW (ref 3.5–5.0)
Alkaline Phosphatase: 105 U/L (ref 38–126)
Anion gap: 15 (ref 5–15)
BUN: 16 mg/dL (ref 8–23)
CO2: 24 mmol/L (ref 22–32)
Calcium: 8.3 mg/dL — ABNORMAL LOW (ref 8.9–10.3)
Chloride: 97 mmol/L — ABNORMAL LOW (ref 98–111)
Creatinine, Ser: 1.09 mg/dL (ref 0.61–1.24)
GFR, Estimated: >60 mL/min (ref >60)
Glucose, Bld: 116 mg/dL — ABNORMAL HIGH (ref 70–99)
Potassium: 3.2 mmol/L — ABNORMAL LOW (ref 3.5–5.1)
Sodium: 136 mmol/L (ref 135–145)
Total Bilirubin: 1.3 mg/dL — ABNORMAL HIGH (ref 0.3–1.2)
Total Protein: 6.8 g/dL (ref 6.5–8.1)

## 2020-03-05 LAB — URINALYSIS, ROUTINE W REFLEX MICROSCOPIC
Bacteria, UA: NONE SEEN
Glucose, UA: 50 mg/dL — AB
Ketones, ur: 20 mg/dL — AB
Leukocytes,Ua: NEGATIVE
Nitrite: NEGATIVE
Protein, ur: 100 mg/dL — AB
Specific Gravity, Urine: 1.026 (ref 1.005–1.030)
pH: 5 (ref 5.0–8.0)

## 2020-03-05 LAB — LIPASE, BLOOD: Lipase: 23 U/L (ref 11–51)

## 2020-03-05 MED ORDER — METOCLOPRAMIDE HCL 5 MG/ML IJ SOLN
10.0000 mg | Freq: Once | INTRAMUSCULAR | Status: AC
  Administered 2020-03-05: 10 mg via INTRAVENOUS
  Filled 2020-03-05: qty 2

## 2020-03-05 MED ORDER — PROMETHAZINE HCL 25 MG RE SUPP: 25.0000 mg | Freq: Four times a day (QID) | RECTAL | 0 refills | Status: AC | PRN

## 2020-03-05 MED ORDER — POTASSIUM CHLORIDE CRYS ER 20 MEQ PO TBCR: 20.0000 meq | EXTENDED_RELEASE_TABLET | Freq: Two times a day (BID) | ORAL | 0 refills | Status: DC

## 2020-03-05 MED ORDER — FAMOTIDINE IN NACL 20-0.9 MG/50ML-% IV SOLN
20.0000 mg | Freq: Once | INTRAVENOUS | Status: AC
  Administered 2020-03-05: 20 mg via INTRAVENOUS
  Filled 2020-03-05: qty 50

## 2020-03-05 MED ORDER — SUCRALFATE 1 G PO TABS
1.0000 g | ORAL_TABLET | Freq: Once | ORAL | Status: AC
  Administered 2020-03-06: 1 g via ORAL
  Filled 2020-03-05: qty 1

## 2020-03-05 MED ORDER — PROMETHAZINE HCL 25 MG RE SUPP
25.0000 mg | Freq: Four times a day (QID) | RECTAL | Status: DC | PRN
  Administered 2020-03-06: 25 mg via RECTAL
  Filled 2020-03-05: qty 1

## 2020-03-05 MED ORDER — SODIUM CHLORIDE 0.9 % IV BOLUS
1000.0000 mL | Freq: Once | INTRAVENOUS | Status: AC
  Administered 2020-03-05: 1000 mL via INTRAVENOUS

## 2020-03-05 MED ORDER — SUCRALFATE 1 G PO TABS: 1.0000 g | ORAL_TABLET | Freq: Three times a day (TID) | ORAL | 0 refills | Status: DC

## 2020-03-05 MED ORDER — ONDANSETRON HCL 4 MG/2ML IJ SOLN
4.0000 mg | Freq: Once | INTRAMUSCULAR | Status: AC
  Administered 2020-03-05: 4 mg via INTRAVENOUS
  Filled 2020-03-05: qty 2

## 2020-03-05 NOTE — Discharge Instructions (Addendum)
Start taking the reglan that was prescribed by Dr. Raliegh Ip yesterday for your nausea and vomiting.  Adding the carafate prescribed before meals and at bedtime may also help you with your symptoms. Take frequent small sips of fluids to avoid dehydration. Your lab tests tonight are reassuring. You do have a low potassium from the vomiting and diarrhea loss - you have been prescribed this supplement to take .   Also, if you take the reglan tablet and you cannot keep it down, you may use the phenergan suppository which is more likely to get in your system and be effective. Do not take both of these medicines at the same time, however.  You have been given a sample of this and a prescription if this is helpful.

## 2020-03-05 NOTE — ED Triage Notes (Signed)
Pt c/o emesis since DC from the hospital on Monday. Pt is a CA pt with chemo q3wks.

## 2020-03-05 NOTE — ED Notes (Signed)
Pt unable to tolerate PO liquids.

## 2020-03-05 NOTE — ED Provider Notes (Signed)
[facility] EMERGENCY DEPARTMENT Provider Note   CSN: [id number] Arrival date & time: 03/05/20  1701     History Chief Complaint  Patient presents with  . Emesis    Lance Stafford is a 74 y.o. 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  He was observed overnight and started on Cardizem which has been controlling his rate.  He was also able to tolerate p.o. intake during his admission, but since he has been home he has been unable to keep any p.o. intake down.  He states any liquid or solid consumption will result in vomiting within 30 minutes.  He also has had several episodes of diarrhea which have been nonbloody daily.  He denies fevers or chills.  He does endorse generalized weakness.  He denies palpitations, chest pain, shortness of breath.  HPI     Past Medical History:  Diagnosis Date  . Arthritis   . Chronic low back pain    truck driver  . GERD (gastroesophageal reflux disease)   . Hyperlipidemia   . Lung cancer ([place])    stage IV non small cell lung ca  . Nocturia   . Port-A-Cath in place 01/10/2020  . Prostate cancer ([place]) UROLOGIST-  DR WRENN/  ONCOLOGIST-  DR MANNING   dx 02/ 2017via TRUSPbx---  Stage T1c,  Gleason 3+3,  PSA 11.9  . Wears glasses   . Wears partial dentures    upper and lower    Patient Active Problem List   Diagnosis Date Noted  . Palliative care by specialist   . DNR (do not resuscitate) discussion   . New onset a-fib ([place]) 03/02/2020  . Dehydration 02/14/2020  . Brain metastases ([place]) 01/22/2020  . Bone metastases ([place]) 01/22/2020  . Metastases to the liver ([place]) 01/22/2020  . 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  Port-A-Cath in place 01/10/2020  . Goals of care, counseling/discussion 01/03/2020  . Malignant neoplasm of lung ([place]) 12/20/2019  . Liver mass 11/26/2019  . 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 GERD (gastroesophageal reflux disease) 11/26/2019  . Abnormal CT of the abdomen 10/25/2019  . Abdominal pain 10/23/2019  . Chest pain 10/23/2019  . Smoker 10/23/2019  . Vaccine refused by patient 10/23/2019  . Dysphagia 10/16/2018  . Prostate cancer ([place]) 06/18/2016  . Carcinoma of prostate ([place]) 06/17/2016  . Hyperlipidemia 06/17/2016  . Lumbago 07/01/2006  . Osteoarthritis 07/01/2006  . Benign neoplasm of colon 12/25/2002  . Cardiomegaly 12/10/2002  . Edema leg 11/16/2002  . Pure hypercholesterolemia 07/13/2002    Past Surgical History:  Procedure Laterality Date  . CATARACT EXTRACTION W/ INTRAOCULAR LENS  IMPLANT, BILATERAL  2015  . CHOLECYSTECTOMY    . CYSTOSCOPY  09/30/2016   Procedure: CYSTOSCOPY;  Surgeon: Irine Seal, MD;  Location: [facility];  Service: Urology;;  no seeds found in bladder  . PORTACATH PLACEMENT Left 01/14/2020   Procedure: INSERTION PORT-A-CATH;  Surgeon: Aviva Signs, MD;  Location: [facility];  Service: General;  Laterality: Left;  . RADIOACTIVE SEED IMPLANT N/A 09/30/2016   Procedure: RADIOACTIVE SEED IMPLANT/BRACHYTHERAPY IMPLANT, SPACE OAR;  Surgeon: Irine Seal, MD;  Location: [facility];  Service: Urology;  Laterality: N/A;  70 seeds implanted  .  SPERMATOCELECTOMY Left 01/30/2015   Procedure: SPERMATOCELECTOMY;  Surgeon: Irine Seal, MD;  Location[facility];  Service: Urology;  Laterality: Left;       Family History  Problem Relation Age of Onset  . Diabetes Mother   . Stroke Mother   . Throat cancer Mother   . Skin cancer Sister   . Prostate cancer Brother   . Cancer Paternal Grandmother   . Clotting disorder Daughter     Social History   Tobacco Use  . Smoking status: Former Smoker    Packs/day: 2.00      Years: 33.00    Pack years: 66.00    Types: Cigarettes    Quit date: 01/23/1991    Years since quitting: 29.1  . Smokeless tobacco: Never Used  Vaping Use  . Vaping Use: Never used  Substance Use Topics  . Alcohol use: No  . Drug use: No    Home Medications Prior to Admission medications   Medication Sig Start Date End Date Taking? Authorizing Provider  aspirin EC 81 MG tablet Take 81 mg by mouth daily.    Yes [provider]  atorvastatin (LIPITOR) 40 MG tablet Take 40 mg by mouth daily.   Yes [provider]  CARBOPLATIN IV Inject into the vein every 21 ( twenty-one) days. 01/17/20  Yes [provider]  dexamethasone (DECADRON) 2 MG tablet Take 1 tablet (2 mg total) by mouth 2 (two) times daily. 03/04/20  Yes [name], MD  diltiazem (CARDIZEM CD) 120 MG 24 hr capsule Take 1 capsule (120 mg total) by mouth daily. 03/03/20 03/03/21 Yes Shah, Pratik D, DO  docusate sodium (COLACE) 100 MG capsule Take 200 mg by mouth daily.   Yes [provider]  folic acid (FOLVITE) 1 MG tablet Take 1 tablet (1 mg total) by mouth daily. 01/03/20  Yes [name], MD  Lactulose 20 GM/30ML SOLN Take 30 ml by mouth every 3 hours until bowel movement is had; then continue taking 30 ml by mouth once daily 01/18/20  Yes [name], MD  lidocaine-prilocaine (EMLA) cream Apply a small amount to port a cath site and cover with plastic wrap 1 hour prior to chemotherapy appointments 01/10/20  Yes [name], MD  metoCLOPramide (REGLAN) 10 MG tablet Take 1 tablet (10 mg total) by mouth every 8 (eight) hours. 03/04/20  Yes [name], MD  omeprazole (PRILOSEC) 40 MG capsule Take 40 mg by mouth daily.   Yes [provider]  oxyCODONE (OXY IR/ROXICODONE) 5 MG immediate release tablet Take 2 tablets (10 mg total) by mouth every 6 (six) hours as needed for severe pain. 02/26/20  Yes [name], MD  sodium chloride 0.9  % SOLN 50 mL with pembrolizumab 100 MG/4ML SOLN 2 mg/kg Inject 200 mg into the vein every 21 ( twenty-one) days. 01/17/20  Yes [provider]  [place]. Devices MISC Bedside Commode  Adjustable bedside table  Shower stool (not shower chair)  Lightweight wheelchair DX: C34.91- Right lung cancer 02/13/20   [name], MD  PEMEtrexed 500 mg/m2 in sodium chloride 0.9 % 100 mL Inject 500 mg/m2 into the vein every 21 ( twenty-one) days. 12/18/19   [provider]  promethazine (PHENERGAN) 25 MG tablet Take 1 tablet (25 mg total) by mouth every 6 (six) hours as needed for nausea or vomiting. Patient not taking: Reported on 03/05/2020 02/28/20   [name], MD    Allergies    Patient has no known allergies.  Review of Systems   Review of Systems  Constitutional: Positive for fatigue. Negative for chills and fever.  HENT: Negative for congestion and sore throat.   Eyes: Negative.   Respiratory: Negative for chest tightness and shortness of breath.   Cardiovascular: Negative for chest pain.  Gastrointestinal: Positive for diarrhea, nausea and vomiting. Negative for abdominal pain and blood in stool.  Genitourinary: Negative.  Negative for dysuria.  Musculoskeletal: Negative for arthralgias, joint swelling and neck pain.  Skin: Negative.  Negative for rash and wound.  Neurological: Negative for dizziness, weakness, light-headedness, numbness and headaches.  Psychiatric/Behavioral: Negative.     Physical Exam Updated Vital Signs BP (!) 161/78   Pulse 63   Temp 97.6 F (36.4 C) (Oral)   Resp 15   Ht 6\' 1"  (1.854 m)   Wt 84.4 kg   SpO2 100%   BMI 24.54 kg/m   Physical Exam Vitals and nursing note reviewed.  Constitutional:      Appearance: He is well-developed.  HENT:     Head: Normocephalic and atraumatic.     Mouth/Throat:     Mouth: Mucous membranes are dry.     Pharynx: Oropharynx is clear.  Cardiovascular:     Rate and Rhythm: Normal rate  and regular rhythm.     Heart sounds: Normal heart sounds.     Comments: Borderline tachycardia Pulmonary:     Effort: Pulmonary effort is normal.     Breath sounds: Normal breath sounds. No wheezing.  Abdominal:     General: Bowel sounds are normal. There is no distension.     Palpations: Abdomen is soft.     Tenderness: There is no abdominal tenderness. There is no guarding.  Musculoskeletal:        General: Normal range of motion.     Cervical back: Normal range of motion.  Skin:    General: Skin is warm and dry.  Neurological:     General: No focal deficit present.     Mental Status: He is alert.  Psychiatric:        Mood and Affect: Mood normal.     ED Results / Procedures / Treatments   Labs (all labs ordered are listed, but only abnormal results are displayed) Labs Reviewed  COMPREHENSIVE METABOLIC PANEL - Abnormal; Notable for the following components:      Result Value   Potassium 3.2 (*)    Chloride 97 (*)    Glucose, Bld 116 (*)    Calcium 8.3 (*)    Albumin 3.1 (*)    Total Bilirubin 1.3 (*)    All other components within normal limits  CBC - Abnormal; Notable for the following components:   WBC 2.5 (*)    All other components within normal limits  URINALYSIS, ROUTINE W REFLEX MICROSCOPIC - Abnormal; Notable for the following components:   APPearance HAZY (*)    Glucose, UA 50 (*)    Hgb urine dipstick SMALL (*)    Bilirubin Urine SMALL (*)    Ketones, ur 20 (*)    Protein, ur 100 (*)    All other components within normal limits  LIPASE, BLOOD    EKG None  Radiology No results found.  Procedures Procedures (including critical care time)  Medications Ordered in ED Medications  sucralfate (CARAFATE) tablet 1 g (has no administration in time range)  sodium chloride 0.9 % bolus 1,000 mL (0 mLs Intravenous Stopped 03/05/20 2131)  ondansetron (ZOFRAN) injection 4 mg (4 mg Intravenous Given  03/05/20 1919)  sodium chloride 0.9 % bolus 1,000 mL  (1,000 mLs Intravenous New Bag/Given 03/05/20 2230)  famotidine (PEPCID) IVPB 20 mg premix (0 mg Intravenous Stopped 03/05/20 2308)  metoCLOPramide (REGLAN) injection 10 mg (10 mg Intravenous Given 03/05/20 2259)    ED Course  I have reviewed the triage vital signs and the nursing notes.  Pertinent labs & imaging results that were available during my care of the patient were reviewed by me and considered in my medical decision making (see chart for details).  Clinical Course as of Mar 05 2321  Wed Nov 24, 11088  [id number] 74 year old male with history of cancer on active chemo here with persistent vomiting.  He usually gets some vomiting after chemotherapy but usually improves with some fluids.  Labs not showing any significant derangement.  Received some fluids and nausea medication here in ice chips but still unable to drink.  He would like to return home so we will continue to try to manage his symptoms.  May need admission.   [MB]    Clinical Course User Index [MB] Hayden Rasmussen, MD   MDM Rules/Calculators/A&P                          Patient was given a liter of IV fluids along with Zofran and he had no nausea and no diarrhea after receiving this.  He was given ice chips which he tolerated.  As soon as he was given an oral challenge with his preferred drink of lemon lime soda it immediately caused vomiting.  At this time his urinalysis resulted and he did have 20 ketones in his urine, he was given a second IV fluid bolus.  Added Reglan IV, carafate PO ordered.  Will re-attempt PO challenge after pt has received these medicines.  If he tolerates this, will plan outpt f/u with reglan and carafate prescriptions for home use.    Final Clinical Impression(s) / ED Diagnoses Final diagnoses:  Nausea vomiting and diarrhea    Rx / DC Orders ED Discharge Orders    None       Landis Martins 03/05/20 2344    Hayden Rasmussen, MD 03/06/20 1009

## 2020-03-05 NOTE — Progress Notes (Signed)
Call received from Amy with patient experience regarding patient's nausea. I have called the patient's wife who reports that the patient has been vomiting almost continuously for the past two days with no relief. Patient is unable to keep fluids and foods down. Patient instructed to go to the Emergency Dept for further evaluation. ED Triage RN made aware.

## 2020-03-06 DIAGNOSIS — R112 Nausea with vomiting, unspecified: Secondary | ICD-10-CM | POA: Diagnosis not present

## 2020-03-06 MED ORDER — HEPARIN SOD (PORK) LOCK FLUSH 100 UNIT/ML IV SOLN
500.0000 [IU] | Freq: Once | INTRAVENOUS | Status: AC
  Administered 2020-03-06: 500 [IU]
  Filled 2020-03-06: qty 5

## 2020-03-12 ENCOUNTER — Other Ambulatory Visit (HOSPITAL_COMMUNITY): Payer: Self-pay

## 2020-03-12 ENCOUNTER — Other Ambulatory Visit: Payer: Self-pay

## 2020-03-12 ENCOUNTER — Encounter: Payer: Self-pay | Admitting: Urology

## 2020-03-12 ENCOUNTER — Ambulatory Visit
Admission: RE | Admit: 2020-03-12 | Discharge: 2020-03-12 | Disposition: A | Discharge status: Home / Self Care | Payer: Medicare Other | Source: Ambulatory Visit | Attending: Urology | Admitting: Urology

## 2020-03-12 ENCOUNTER — Other Ambulatory Visit (HOSPITAL_COMMUNITY): Payer: Self-pay | Admitting: Hematology

## 2020-03-12 DIAGNOSIS — C7931 Secondary malignant neoplasm of brain: Secondary | ICD-10-CM

## 2020-03-12 MED ORDER — SUCRALFATE 1 G PO TABS
1.0000 g | ORAL_TABLET | Freq: Three times a day (TID) | ORAL | 1 refills | Status: DC
Start: 2020-03-12 — End: 2020-03-13

## 2020-03-12 MED ORDER — POTASSIUM CHLORIDE CRYS ER 20 MEQ PO TBCR
20.0000 meq | EXTENDED_RELEASE_TABLET | Freq: Two times a day (BID) | ORAL | 0 refills | Status: DC
Start: 2020-03-12 — End: 2020-03-12

## 2020-03-12 MED ORDER — POTASSIUM CHLORIDE CRYS ER 20 MEQ PO TBCR
20.0000 meq | EXTENDED_RELEASE_TABLET | Freq: Every day | ORAL | 1 refills | Status: DC
Start: 2020-03-12 — End: 2020-03-13

## 2020-03-12 MED ORDER — SUCRALFATE 1 G PO TABS
1.0000 g | ORAL_TABLET | Freq: Three times a day (TID) | ORAL | 1 refills | Status: DC
Start: 2020-03-12 — End: 2020-03-12

## 2020-03-12 NOTE — Progress Notes (Signed)
Radiation Oncology         [phone] ________________________________  Name: Lance Stafford MRN: [id number]  Date: 03/12/2020  DOB: 03-13-1946  Post Treatment Note  CC: Lance Crigler, NP  Lance Jack, MD  Diagnosis:   74 y.o. 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  The patient was notified in advance and gave permission to proceed with this visit format.  He tolerated the [facility] treatment well without any negative side effects and was discharged home in stable condition.                              On review of systems, the patient states that he is doing fairly well in general.  He denies any neurologic issues or ill side effects from his [facility] treatment.  He has continued on systemic chemotherapy with Carboplatin/Pemetrexed/Keytruda, under the care and direction of Dr. Delton Stafford.  Unfortunately, he has had several bouts of intractable nausea and vomiting following his chemo treatments, requiring evaluation in the emergency department for fluid resuscitation, most recently on 03/05/2020.  However, since his last ER visit, he has had much improved appetite and energy and has not had further N/V which he is quite pleased with. He specifically denies headaches, changes in visual or auditory acuity, dizziness/imbalance, focal weakness in the upper or lower extremities, tenderness or seizure activity.  ALLERGIES:  has No  Known Allergies.  Meds: Current Outpatient Medications  Medication Sig Dispense Refill  . aspirin EC 81 MG tablet Take 81 mg by mouth daily.     Marland Kitchen atorvastatin (LIPITOR) 40 MG tablet Take 40 mg by mouth daily.    Marland Kitchen CARBOPLATIN IV Inject into the vein every 21 ( twenty-one) days.    Marland Kitchen dexamethasone (DECADRON) 2 MG tablet Take 1 tablet (2 mg total) by mouth 2 (two) times daily. 60 tablet 1  . diltiazem (CARDIZEM CD) 120 MG 24 hr capsule Take 1 capsule (120 mg total) by mouth daily. 30 capsule 11  . docusate sodium (COLACE) 100 MG capsule Take 200 mg by mouth daily.    . folic acid (FOLVITE) 1 MG tablet Take 1 tablet (1 mg total) by mouth daily. 90 tablet 1  . Lactulose 20 GM/30ML SOLN Take 30 ml by mouth every 3 hours until bowel movement is had; then continue taking 30 ml by mouth once daily 450 mL 2  . lidocaine-prilocaine (EMLA) cream Apply a small amount to port a cath site and cover with plastic wrap 1 hour prior to chemotherapy appointments 30 g 3  . metoCLOPramide (REGLAN) 10 MG tablet Take 1 tablet (10 mg total) by mouth every 8 (eight) hours. 90 tablet 1  . Misc. Devices MISC Bedside Commode  Adjustable bedside table  Shower stool (not shower chair)  Lightweight wheelchair DX: C34.91- Right lung cancer 1 each 99  . omeprazole (PRILOSEC) 40 MG capsule Take 40 mg  by mouth daily.    Marland Kitchen oxyCODONE (OXY IR/ROXICODONE) 5 MG immediate release tablet Take 2 tablets (10 mg total) by mouth every 6 (six) hours as needed for severe pain. 120 tablet 0  . PEMEtrexed 500 mg/m2 in sodium chloride 0.9 % 100 mL Inject 500 mg/m2 into the vein every 21 ( twenty-one) days.    . potassium chloride SA (KLOR-CON) 20 MEQ tablet Take 1 tablet (20 mEq total) by mouth 2 (two) times daily. 14 tablet 0  . promethazine (PHENERGAN) 25 MG suppository Place 1 suppository (25 mg total) rectally every 6 (six) hours as needed for nausea or vomiting. 12 each 0  . promethazine (PHENERGAN) 25 MG tablet Take 1 tablet (25 mg  total) by mouth every 6 (six) hours as needed for nausea or vomiting. (Patient not taking: Reported on 03/05/2020) 30 tablet 2  . sodium chloride 0.9 % SOLN 50 mL with pembrolizumab 100 MG/4ML SOLN 2 mg/kg Inject 200 mg into the vein every 21 ( twenty-one) days.    . sucralfate (CARAFATE) 1 g tablet Take 1 tablet (1 g total) by mouth 4 (four) times daily -  with meals and at bedtime. 30 tablet 0   No current facility-administered medications for this visit.    Physical Findings:  vitals were not taken for this visit.   /Unable to assess due to telephone follow-up visit format.  Lab Findings: Lab Results  Component Value Date   WBC 2.5 (L) 03/05/2020   HGB 13.9 03/05/2020   HCT 40.4 03/05/2020   MCV 90.8 03/05/2020   PLT 208 03/05/2020     Radiographic Findings: DG Chest Port 1 View  Result Date: 03/02/2020 CLINICAL DATA:  Weakness and dehydration.  History of lung cancer. EXAM: PORTABLE CHEST 1 VIEW COMPARISON:  01/14/2020 FINDINGS: Left subclavian Port-A-Cath unchanged. Lungs are adequately inflated without focal airspace consolidation or effusion. Known right apical nodule not well visualized. Minimal biapical pleural thickening unchanged. Cardiomediastinal silhouette and remainder of the exam is unchanged. IMPRESSION: No acute cardiopulmonary disease. Electronically Signed   By: Marin Olp M.D.   On: 03/02/2020 11:47   ECHOCARDIOGRAM COMPLETE  Result Date: 03/03/2020    ECHOCARDIOGRAM REPORT   Patient Name:   Lance Stafford Date of Exam: 03/03/2020 Medical Rec #:  [id number]      Height:       73.0 in Accession #:    [id number]     Weight:       191.4 lb Date of Birth:  1945/07/30     BSA:          2.112 m Patient Age:    69 years       BP:           111/78 mmHg Patient Gender: M              HR:           85 bpm. Exam Location:  [facility] Procedure: 2D Echo Indications:    Atrial Fibrillation 427.31/l48.91  History:        Patient has no prior history of Echocardiogram  examinations.                 Arrythmias:Atrial Fibrillation; Risk Factors:Dyslipidemia.                 Malignant neoplasm of right lung, Metastases to the liver,                 Prostate cancer ([place]) (From Hx).  Sonographer:    Alvino Chapel RCS Referring Phys: [id number] Vinton D Rainier  1. Left ventricular ejection fraction, by estimation, is 60 to 65%. The left ventricle has normal function. The left ventricle has no regional wall motion abnormalities. Left ventricular diastolic parameters are indeterminate.  2. Right ventricular systolic function is normal. The right ventricular size is normal.  3. The mitral valve is normal in structure. Mild mitral valve regurgitation.  4. The aortic valve is normal in structure. Aortic valve regurgitation is mild.  5. The inferior vena cava is normal in size with greater than 50% respiratory variability, suggesting right atrial pressure of 3 mmHg. FINDINGS  Left Ventricle: Left ventricular ejection fraction, by estimation, is 60 to 65%. The left ventricle has normal function. The left ventricle has no regional wall motion abnormalities. The left ventricular internal cavity size was normal in size. There is  no left ventricular hypertrophy. Left ventricular diastolic parameters are indeterminate. Right Ventricle: The right ventricular size is normal. Right vetricular wall thickness was not assessed. Right ventricular systolic function is normal. Left Atrium: Left atrial size was normal in size. Right Atrium: Right atrial size was normal in size. Pericardium: There is no evidence of pericardial effusion. Mitral Valve: The mitral valve is normal in structure. Mild mitral valve regurgitation. Tricuspid Valve: The tricuspid valve is normal in structure. Tricuspid valve regurgitation is trivial. Aortic Valve: The aortic valve is normal in structure. Aortic valve regurgitation is mild. Aortic regurgitation PHT measures 543 msec. Pulmonic Valve: The pulmonic valve was not  well visualized. Pulmonic valve regurgitation is not visualized. No evidence of pulmonic stenosis. Aorta: The aortic root is normal in size and structure. Venous: The inferior vena cava is normal in size with greater than 50% respiratory variability, suggesting right atrial pressure of 3 mmHg. IAS/Shunts: No atrial level shunt detected by color flow Doppler.  LEFT VENTRICLE PLAX 2D LVIDd:         3.50 cm  Diastology LVIDs:         2.40 cm  LV e' medial:    7.62 cm/s LV PW:         0.90 cm  LV E/e' medial:  7.5 LV IVS:        0.90 cm  LV e' lateral:   9.46 cm/s LVOT diam:     2.00 cm  LV E/e' lateral: 6.0 LV SV:         72 LV SV Index:   34 LVOT Area:     3.14 cm  RIGHT VENTRICLE TAPSE (M-mode): 1.4 cm LEFT ATRIUM             Index       RIGHT ATRIUM           Index LA diam:        3.00 cm 1.42 cm/m  RA Area:     15.40 cm LA Vol (A2C):   37.9 ml 17.95 ml/m RA Volume:   35.10 ml  16.62 ml/m LA Vol (A4C):   45.4 ml 21.50 ml/m LA Biplane Vol: 41.7 ml 19.75 ml/m  AORTIC VALVE LVOT Vmax:   132.00 cm/s LVOT Vmean:  84.600 cm/s LVOT VTI:    0.228 m AI PHT:      543 msec  AORTA Ao Root diam: 3.60 cm MITRAL VALVE MV Area (PHT): 5.06 cm     SHUNTS MV Decel Time: 150 msec     Systemic VTI:  0.23 m MV E velocity: 57.00 cm/s  Systemic Diam: 2.00 cm MV A velocity: 104.00 cm/s MV E/A ratio:  0.55 Dorris Carnes MD Electronically signed by Dorris Carnes MD Signature Date/Time: 03/03/2020/2:36:55 PM    Final     Impression/Plan: 4. 74 y.o. 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 He appears to have recovered well from the effects of his recent SRS brain treatment and is currently without complaints.  We discussed the plan to repeat MRI brain in January 2022 to assess his response to treatment.  We will plan to follow-up by phone thereafter to review results and any recommendations from the multidisciplinary brain conference where his case and imaging will be  reviewed prior to his follow-up visit.  He will also continue in routine follow-up under the care and direction of Dr. Delton Stafford.  He is scheduled for a repeat PET scan on 03/17/2020 and follow-up thereafter with Faythe Casa, NP with Dr. Delton Stafford on 03/20/2020.  He knows that he is welcome to call with any questions or concerns related to his radiotherapy in the interim.    Nicholos Johns, PA-C

## 2020-03-13 ENCOUNTER — Other Ambulatory Visit (HOSPITAL_COMMUNITY): Payer: Self-pay

## 2020-03-13 MED ORDER — SUCRALFATE 1 G PO TABS
1.0000 g | ORAL_TABLET | Freq: Three times a day (TID) | ORAL | 1 refills | Status: DC
Start: 2020-03-13 — End: 2020-04-22

## 2020-03-13 MED ORDER — POTASSIUM CHLORIDE CRYS ER 20 MEQ PO TBCR
20.0000 meq | EXTENDED_RELEASE_TABLET | Freq: Every day | ORAL | 1 refills | Status: DC
Start: 2020-03-13 — End: 2020-04-07

## 2020-03-17 ENCOUNTER — Other Ambulatory Visit: Payer: Self-pay

## 2020-03-17 ENCOUNTER — Ambulatory Visit (HOSPITAL_COMMUNITY)
Admission: RE | Admit: 2020-03-17 | Discharge: 2020-03-17 | Disposition: A | Discharge status: Home / Self Care | Payer: Medicare Other | Source: Ambulatory Visit | Attending: Hematology | Admitting: Hematology

## 2020-03-17 DIAGNOSIS — C3491 Malignant neoplasm of unspecified part of right bronchus or lung: Secondary | ICD-10-CM | POA: Insufficient documentation

## 2020-03-17 DIAGNOSIS — C787 Secondary malignant neoplasm of liver and intrahepatic bile duct: Secondary | ICD-10-CM | POA: Insufficient documentation

## 2020-03-17 MED ORDER — FLUDEOXYGLUCOSE F - 18 (FDG) INJECTION
13.2000 | Freq: Once | INTRAVENOUS | Status: AC | PRN
  Administered 2020-03-17: 13.2 via INTRAVENOUS

## 2020-03-20 ENCOUNTER — Inpatient Hospital Stay (HOSPITAL_COMMUNITY): Payer: Medicare Other | Attending: Hematology and Oncology | Admitting: Oncology

## 2020-03-20 ENCOUNTER — Inpatient Hospital Stay (HOSPITAL_COMMUNITY): Payer: Medicare Other

## 2020-03-20 ENCOUNTER — Other Ambulatory Visit: Payer: Self-pay | Admitting: Hematology and Oncology

## 2020-03-20 ENCOUNTER — Other Ambulatory Visit: Payer: Self-pay

## 2020-03-20 VITALS — BP 127/86 | HR 70 | Temp 97.3°F | Resp 18 | Wt 201.1 lb

## 2020-03-20 VITALS — BP 121/76 | HR 72 | Temp 97.0°F | Resp 18

## 2020-03-20 DIAGNOSIS — Z5112 Encounter for antineoplastic immunotherapy: Secondary | ICD-10-CM | POA: Insufficient documentation

## 2020-03-20 DIAGNOSIS — Z5111 Encounter for antineoplastic chemotherapy: Secondary | ICD-10-CM | POA: Insufficient documentation

## 2020-03-20 DIAGNOSIS — Z95828 Presence of other vascular implants and grafts: Secondary | ICD-10-CM

## 2020-03-20 DIAGNOSIS — C3490 Malignant neoplasm of unspecified part of unspecified bronchus or lung: Secondary | ICD-10-CM

## 2020-03-20 DIAGNOSIS — C787 Secondary malignant neoplasm of liver and intrahepatic bile duct: Secondary | ICD-10-CM | POA: Diagnosis not present

## 2020-03-20 DIAGNOSIS — Z79899 Other long term (current) drug therapy: Secondary | ICD-10-CM | POA: Insufficient documentation

## 2020-03-20 DIAGNOSIS — C3491 Malignant neoplasm of unspecified part of right bronchus or lung: Secondary | ICD-10-CM

## 2020-03-20 DIAGNOSIS — Z87891 Personal history of nicotine dependence: Secondary | ICD-10-CM | POA: Diagnosis not present

## 2020-03-20 DIAGNOSIS — C7971 Secondary malignant neoplasm of right adrenal gland: Secondary | ICD-10-CM | POA: Insufficient documentation

## 2020-03-20 LAB — COMPREHENSIVE METABOLIC PANEL
ALT: 24 U/L (ref 0–44)
AST: 17 U/L (ref 15–41)
Albumin: 2.8 g/dL — ABNORMAL LOW (ref 3.5–5.0)
Alkaline Phosphatase: 67 U/L (ref 38–126)
Anion gap: 8 (ref 5–15)
BUN: 15 mg/dL (ref 8–23)
CO2: 23 mmol/L (ref 22–32)
Calcium: 7.7 mg/dL — ABNORMAL LOW (ref 8.9–10.3)
Chloride: 104 mmol/L (ref 98–111)
Creatinine, Ser: 0.91 mg/dL (ref 0.61–1.24)
GFR, Estimated: >60 mL/min (ref >60)
Glucose, Bld: 105 mg/dL — ABNORMAL HIGH (ref 70–99)
Potassium: 3.6 mmol/L (ref 3.5–5.1)
Sodium: 135 mmol/L (ref 135–145)
Total Bilirubin: 0.5 mg/dL (ref 0.3–1.2)
Total Protein: 5.6 g/dL — ABNORMAL LOW (ref 6.5–8.1)

## 2020-03-20 LAB — CBC WITH DIFFERENTIAL/PLATELET
Abs Immature Granulocytes: 0.34 10*3/uL — ABNORMAL HIGH (ref 0.00–0.07)
Basophils Absolute: 0.1 10*3/uL (ref 0.0–0.1)
Basophils Relative: 1 %
Eosinophils Absolute: 0 10*3/uL (ref 0.0–0.5)
Eosinophils Relative: 0 %
HCT: 33.4 % — ABNORMAL LOW (ref 39.0–52.0)
Hemoglobin: 10.8 g/dL — ABNORMAL LOW (ref 13.0–17.0)
Immature Granulocytes: 4 %
Lymphocytes Relative: 18 %
Lymphs Abs: 1.5 10*3/uL (ref 0.7–4.0)
MCH: 31.6 pg (ref 26.0–34.0)
MCHC: 32.3 g/dL (ref 30.0–36.0)
MCV: 97.7 fL (ref 80.0–100.0)
Monocytes Absolute: 1.3 10*3/uL — ABNORMAL HIGH (ref 0.1–1.0)
Monocytes Relative: 16 %
Neutro Abs: 5 10*3/uL (ref 1.7–7.7)
Neutrophils Relative %: 61 %
Platelets: 165 10*3/uL (ref 150–400)
RBC: 3.42 MIL/uL — ABNORMAL LOW (ref 4.22–5.81)
RDW: 17.4 % — ABNORMAL HIGH (ref 11.5–15.5)
WBC: 8.1 10*3/uL (ref 4.0–10.5)
nRBC: 1.1 % — ABNORMAL HIGH (ref 0.0–0.2)

## 2020-03-20 LAB — MAGNESIUM: Magnesium: 1.5 mg/dL — ABNORMAL LOW (ref 1.7–2.4)

## 2020-03-20 MED ORDER — HEPARIN SOD (PORK) LOCK FLUSH 100 UNIT/ML IV SOLN
500.0000 [IU] | Freq: Once | INTRAVENOUS | Status: AC | PRN
  Administered 2020-03-20: 500 [IU]

## 2020-03-20 MED ORDER — PALONOSETRON HCL INJECTION 0.25 MG/5ML
0.2500 mg | Freq: Once | INTRAVENOUS | Status: AC
  Administered 2020-03-20: 0.25 mg via INTRAVENOUS
  Filled 2020-03-20: qty 5

## 2020-03-20 MED ORDER — SODIUM CHLORIDE 0.9 % IV SOLN: Freq: Once | INTRAVENOUS | Status: AC

## 2020-03-20 MED ORDER — SODIUM CHLORIDE 0.9 % IV SOLN
150.0000 mg | Freq: Once | INTRAVENOUS | Status: AC
  Administered 2020-03-20: 150 mg via INTRAVENOUS
  Filled 2020-03-20: qty 60

## 2020-03-20 MED ORDER — SODIUM CHLORIDE 0.9 % IV SOLN
10.0000 mg | Freq: Once | INTRAVENOUS | Status: AC
  Administered 2020-03-20: 10 mg via INTRAVENOUS
  Filled 2020-03-20: qty 10

## 2020-03-20 MED ORDER — SODIUM CHLORIDE 0.9% FLUSH
10.0000 mL | INTRAVENOUS | Status: DC | PRN
  Administered 2020-03-20: 10 mL

## 2020-03-20 MED ORDER — SODIUM CHLORIDE 0.9 % IV SOLN
200.0000 mg | Freq: Once | INTRAVENOUS | Status: AC
  Administered 2020-03-20: 200 mg via INTRAVENOUS
  Filled 2020-03-20: qty 8

## 2020-03-20 MED ORDER — SODIUM CHLORIDE 0.9 % IV SOLN
500.0000 mg/m2 | Freq: Once | INTRAVENOUS | Status: AC
  Administered 2020-03-20: 1100 mg via INTRAVENOUS
  Filled 2020-03-20: qty 40

## 2020-03-20 MED ORDER — SODIUM CHLORIDE 0.9 % IV SOLN
590.0000 mg | Freq: Once | INTRAVENOUS | Status: AC
  Administered 2020-03-20: 590 mg via INTRAVENOUS
  Filled 2020-03-20: qty 59

## 2020-03-20 NOTE — Patient Instructions (Signed)
[facility] Discharge Instructions for Patients Receiving Chemotherapy   Beginning January 23rd 2017 lab work for the [facility] will be done in the  Main lab at [facility] on 1st floor. If you have a lab appointment with the [facility] please come in thru the  Main Entrance and check in at the main information desk   Today you received the following chemotherapy agents Carbo, Alimta, and Keytruda  To help prevent nausea and vomiting after your treatment, we encourage you to take your nausea medication   If you develop nausea and vomiting, or diarrhea that is not controlled by your medication, call the clinic.  The clinic phone number is [phone]. Office hours are Monday-Friday 8:30am-5:00pm.  BELOW ARE SYMPTOMS THAT SHOULD BE REPORTED IMMEDIATELY:  *FEVER GREATER THAN 101.0 F  *CHILLS WITH OR WITHOUT FEVER  NAUSEA AND VOMITING THAT IS NOT CONTROLLED WITH YOUR NAUSEA MEDICATION  *UNUSUAL SHORTNESS OF BREATH  *UNUSUAL BRUISING OR BLEEDING  TENDERNESS IN MOUTH AND THROAT WITH OR WITHOUT PRESENCE OF ULCERS  *URINARY PROBLEMS  *BOWEL PROBLEMS  UNUSUAL RASH Items with * indicate a potential emergency and should be followed up as soon as possible. If you have an emergency after office hours please contact your primary care physician or go to the nearest emergency department.  Please call the clinic during office hours if you have any questions or concerns.   You may also contact the Patient Navigator at [phone] should you have any questions or need assistance in obtaining follow up care.      Resources For Cancer Patients and their Caregivers ? [employer]: Can assist with transportation, wigs, general needs, runs Look Good Feel Better.        5147920421 ? Cancer Care: Provides financial assistance, online support groups, medication/co-pay assistance.  1-800-813-HOPE 804-197-1300) ? [facility]  [place] cancer patients and their families through emotional , educational and financial support.  619 510 8999 ? [place] DSS Where to apply for food stamps, Medicaid and utility assistance. 478-461-2753 ? [facility]: Transportation to medical appointments. 949-860-6854 ? Social Security Administration: May apply for disability if have a Stage IV cancer. 315-514-2279 307-821-0910 ? [employer], Disability and Transit Services: Assists with nutrition, care and transit needs. [phone]

## 2020-03-20 NOTE — Progress Notes (Signed)
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  PRIOR THERAPY: SRS on 01/31/2020  NGS Results: PD-L1 TPS 1%, Foundation 1 MS--stable, TMB 10 Muts/Mb  CURRENT THERAPY: Carboplatin, pemetrexed and Keytruda every 3 weeks  BRIEF ONCOLOGIC HISTORY:  Oncology History  Malignant neoplasm of lung ([place])  12/20/2019 Initial Diagnosis   Adenocarcinoma of lung, stage 4, right ([place])   12/20/2019 Cancer Staging   Staging form: Lung, AJCC 8th Edition - Clinical: Stage IVB (cT1b, cN2, pM1c) - Signed by [name], MD on 12/20/2019   12/25/2019 Genetic Testing   PDL1     12/31/2019 [place] [facility]     01/17/2020 -  Chemotherapy   The patient had palonosetron (ALOXI) injection 0.25 mg, 0.25 mg, Intravenous,  Once, 3 of 4 cycles Administration: 0.25 mg (01/17/2020), 0.25 mg (02/28/2020), 0.25 mg (02/07/2020) PEMEtrexed (ALIMTA) 1,100 mg in sodium chloride 0.9 % 100 mL chemo infusion, 500 mg/m2 = 1,100 mg, Intravenous,  Once, 3 of 6 cycles Administration: 1,100 mg (01/17/2020), 1,100 mg (02/28/2020), 1,100 mg (02/07/2020) CARBOplatin (PARAPLATIN) 590 mg in sodium chloride 0.9 % 250 mL chemo infusion, 590 mg (100 % of original dose 588.5 mg), Intravenous,  Once, 3 of 4 cycles Dose modification:   (original dose 588.5 mg, Cycle 1),   (original dose 612 mg, Cycle 3),   (original dose 612 mg, Cycle 2) Administration: 590 mg (01/17/2020), 590 mg (02/28/2020), 590 mg (02/07/2020) fosaprepitant (EMEND) 150 mg in sodium chloride 0.9 % 145 mL IVPB, 150 mg, Intravenous,  Once, 3 of 4 cycles Administration: 150 mg (01/17/2020), 150 mg (02/28/2020), 150 mg (02/07/2020) pembrolizumab (KEYTRUDA) 200 mg in sodium chloride 0.9 % 50 mL chemo infusion, 200 mg, Intravenous, Once, 3 of 6  cycles Administration: 200 mg (01/17/2020), 200 mg (02/28/2020), 200 mg (02/07/2020)  for chemotherapy treatment.      CANCER STAGING: Cancer Staging Malignant neoplasm of lung [facility]) Staging form: Lung, AJCC 8th Edition - Clinical: Stage IVB (cT1b, cN2, pM1c) - Signed by [name], MD on 12/20/2019   INTERVAL HISTORY:  [name], a 74 y.o. male, returns for routine follow-up and consideration for next cycle of chemotherapy. Grace was last seen on 02/28/2020.  Due for cycle #4 of carboplatin, pemetrexed and Keytruda today.   Today he is accompanied by his wife. Overall, he tells me he has been much improved since his last treatment on 02/28/2020.  On 03/02/2020 he presented to the emergency room with tachycardia and was found to be in atrial fibrillation with RVR.  He was hydrated and started on Cardizem.  He was evaluated again on 03/05/2020 with non-intractable nausea and vomiting.  He was given IV fluids and given steroids and antiemetics with much improvement of his symptoms.  In the interim, he has done extremely well.  His wife states that since Thanksgiving, he has had an appetite and has not had any additional nausea or vomiting.  He is using his new antinausea medicine that appears to be helping well.  His energy levels have improved and he denies any pains.  He uses a walker when walking long distances and to ensure stability.  At home he does not require any assistive devices.  He denies any pain.  He is very anxious to review his  PET scan results.  Overall, he feels ready for next cycle of chemo today.    REVIEW OF SYSTEMS:  Review of Systems  Constitutional: Negative.  Negative for appetite change, chills, fatigue and fever.  HENT:  Negative.  Negative for hearing loss, lump/mass, mouth sores and nosebleeds.   Eyes: Negative.  Negative for eye problems.  Respiratory: Negative for cough, hemoptysis and shortness of breath.   Cardiovascular:  Negative.  Negative for chest pain and leg swelling.  Gastrointestinal: Negative.  Negative for abdominal pain, blood in stool, constipation, diarrhea, nausea and vomiting.  Endocrine: Negative.  Negative for hot flashes.  Genitourinary: Negative.  Negative for bladder incontinence, difficulty urinating, dysuria, frequency and hematuria.   Musculoskeletal: Negative.  Negative for back pain, flank pain, gait problem and myalgias.  Skin: Negative.  Negative for itching and rash.  Neurological: Negative.  Negative for dizziness, gait problem, headaches, light-headedness and numbness.  Hematological: Negative.  Negative for adenopathy.  Psychiatric/Behavioral: Negative for confusion. The patient is nervous/anxious.     PAST MEDICAL/SURGICAL HISTORY:  Past Medical History:  Diagnosis Date  . Arthritis   . Chronic low back pain    truck driver  . GERD (gastroesophageal reflux disease)   . Hyperlipidemia   . Lung cancer ([place])    stage IV non small cell lung ca  . Nocturia   . Port-A-Cath in place 01/10/2020  . Prostate cancer ([place]) UROLOGIST-  DR WRENN/  ONCOLOGIST-  DR MANNING   dx 02/ 2017via TRUSPbx---  Stage T1c,  Gleason 3+3,  PSA 11.9  . Wears glasses   . Wears partial dentures    upper and lower   Past Surgical History:  Procedure Laterality Date  . CATARACT EXTRACTION W/ INTRAOCULAR LENS  IMPLANT, BILATERAL  2015  . CHOLECYSTECTOMY    . CYSTOSCOPY  09/30/2016   Procedure: CYSTOSCOPY;  Surgeon: Irine Seal, MD;  [facility];  Service: Urology;;  no seeds found in bladder  . PORTACATH PLACEMENT Left 01/14/2020   Procedure: INSERTION PORT-A-CATH;  Surgeon: Aviva Signs, MD;  Location: [facility];  Service: General;  Laterality: Left;  . RADIOACTIVE SEED IMPLANT N/A 09/30/2016   Procedure: RADIOACTIVE SEED IMPLANT/BRACHYTHERAPY IMPLANT, SPACE OAR;  Surgeon: Irine Seal, MD;  Location: [facility];  Service: Urology;  Laterality: N/A;  70 seeds  implanted  . SPERMATOCELECTOMY Left 01/30/2015   Procedure: SPERMATOCELECTOMY;  Surgeon: Irine Seal, MD;  Location: [facility];  Service: Urology;  Laterality: Left;    SOCIAL HISTORY:  Social History   Socioeconomic History  . Marital status: Married    Spouse name: Not on file  . Number of children: 2  . Years of education: Not on file  . Highest education level: Not on file  Occupational History  . Occupation: truck Geophysicist/field seismologist  Tobacco Use  . Smoking status: Former Smoker    Packs/day: 2.00    Years: 33.00    Pack years: 66.00    Types: Cigarettes    Quit date: 01/23/1991    Years since quitting: 29.1  . Smokeless tobacco: Never Used  Vaping Use  . Vaping Use: Never used  Substance and Sexual Activity  . Alcohol use: No  . Drug use: No  . Sexual activity: Yes  Other Topics Concern  . Not on file  Social History Narrative  . Not on file   Social Determinants of Health   Financial Resource Strain: Low Risk   . Difficulty of Paying Living Expenses:  Not hard at all  Food Insecurity: No Food Insecurity  . Worried About Charity fundraiser in the Last Year: Never true  . Ran Out of Food in the Last Year: Never true  Transportation Needs: No Transportation Needs  . Lack of Transportation (Medical): No  . Lack of Transportation (Non-Medical): No  Physical Activity: Sufficiently Active  . Days of Exercise per Week: 1 day  . Minutes of Exercise per Session: 150+ min  Stress: No Stress Concern Present  . Feeling of Stress : Only a little  Social Connections: Socially Integrated  . Frequency of Communication with Friends and Family: More than three times a week  . Frequency of Social Gatherings with Friends and Family: Three times a week  . Attends Religious Services: More than 4 times per year  . Active Member of Clubs or Organizations: Yes  . Attends Archivist Meetings: More than 4 times per year  . Marital Status: Married  Human resources officer  Violence: Not At Risk  . Fear of Current or Ex-Partner: No  . Emotionally Abused: No  . Physically Abused: No  . Sexually Abused: No    FAMILY HISTORY:  Family History  Problem Relation Age of Onset  . Diabetes Mother   . Stroke Mother   . Throat cancer Mother   . Skin cancer Sister   . Prostate cancer Brother   . Cancer Paternal Grandmother   . Clotting disorder Daughter     CURRENT MEDICATIONS:  Current Outpatient Medications  Medication Sig Dispense Refill  . aspirin EC 81 MG tablet Take 81 mg by mouth daily.     Marland Kitchen atorvastatin (LIPITOR) 40 MG tablet Take 40 mg by mouth daily.    Marland Kitchen CARBOPLATIN IV Inject into the vein every 21 ( twenty-one) days.    Marland Kitchen dexamethasone (DECADRON) 2 MG tablet Take 1 tablet (2 mg total) by mouth 2 (two) times daily. 60 tablet 1  . diltiazem (CARDIZEM CD) 120 MG 24 hr capsule Take 1 capsule (120 mg total) by mouth daily. 30 capsule 11  . docusate sodium (COLACE) 100 MG capsule Take 200 mg by mouth daily.    . folic acid (FOLVITE) 1 MG tablet Take 1 tablet (1 mg total) by mouth daily. 90 tablet 1  . Lactulose 20 GM/30ML SOLN Take 30 ml by mouth every 3 hours until bowel movement is had; then continue taking 30 ml by mouth once daily 450 mL 2  . lidocaine-prilocaine (EMLA) cream Apply a small amount to port a cath site and cover with plastic wrap 1 hour prior to chemotherapy appointments 30 g 3  . metoCLOPramide (REGLAN) 10 MG tablet Take 1 tablet (10 mg total) by mouth every 8 (eight) hours. 90 tablet 1  . Misc. Devices MISC Bedside Commode  Adjustable bedside table  Shower stool (not shower chair)  Lightweight wheelchair DX: C34.91- Right lung cancer 1 each 99  . nabumetone (RELAFEN) 500 MG tablet nabumetone 500 mg tablet  Take 1 tablet twice a day by oral route.    Marland Kitchen omeprazole (PRILOSEC) 40 MG capsule Take 40 mg by mouth daily.    Marland Kitchen oxyCODONE (OXY IR/ROXICODONE) 5 MG immediate release tablet Take 2 tablets (10 mg total) by mouth every 6 (six)  hours as needed for severe pain. 120 tablet 0  . PEMEtrexed 500 mg/m2 in sodium chloride 0.9 % 100 mL Inject 500 mg/m2 into the vein every 21 ( twenty-one) days.    . potassium chloride SA (KLOR-CON)  20 MEQ tablet Take 1 tablet (20 mEq total) by mouth daily. 30 tablet 1  . promethazine (PHENERGAN) 25 MG suppository Place 1 suppository (25 mg total) rectally every 6 (six) hours as needed for nausea or vomiting. 12 each 0  . promethazine (PHENERGAN) 25 MG tablet Take 1 tablet (25 mg total) by mouth every 6 (six) hours as needed for nausea or vomiting. 30 tablet 2  . sodium chloride 0.9 % SOLN 50 mL with pembrolizumab 100 MG/4ML SOLN 2 mg/kg Inject 200 mg into the vein every 21 ( twenty-one) days.    . sucralfate (CARAFATE) 1 g tablet Take 1 tablet (1 g total) by mouth 4 (four) times daily -  with meals and at bedtime. 60 tablet 1   No current facility-administered medications for this visit.    ALLERGIES:  No Known Allergies  PHYSICAL EXAM:  Performance status (ECOG): 1 - Symptomatic but completely ambulatory  Vitals:   03/20/20 0941  BP: 127/86  Pulse: 70  Resp: 18  Temp: (!) 97.3 F (36.3 C)  SpO2: 97%   Wt Readings from Last 3 Encounters:  03/20/20 201 lb 1.6 oz (91.2 kg)  03/05/20 186 lb (84.4 kg)  03/02/20 191 lb 5.8 oz (86.8 kg)   Physical Exam Vitals reviewed.  Constitutional:      Appearance: Normal appearance.  Cardiovascular:     Rate and Rhythm: Normal rate and regular rhythm.     Pulses: Normal pulses.     Heart sounds: Normal heart sounds.  Pulmonary:     Effort: Pulmonary effort is normal.     Breath sounds: Normal breath sounds.  Chest:     Comments: Port-a-Cath in L chest Neurological:     General: No focal deficit present.     Mental Status: He is alert and oriented to person, place, and time.  Psychiatric:        Mood and Affect: Mood normal.        Behavior: Behavior normal.     LABORATORY DATA:  I have reviewed the labs as listed.  CBC Latest  Ref Rng & Units 03/20/2020 03/05/2020 03/03/2020  WBC 4.0 - 10.5 K/uL 8.1 2.5(L) 4.9  Hemoglobin 13.0 - 17.0 g/dL 10.8(L) 13.9 13.2  Hematocrit 39.0 - 52.0 % 33.4(L) 40.4 38.4(L)  Platelets 150 - 400 K/uL 165 208 283   CMP Latest Ref Rng & Units 03/20/2020 03/05/2020 03/03/2020  Glucose 70 - 99 mg/dL 105(H) 116(H) 104(H)  BUN 8 - 23 mg/dL _0 Creatinine 0.61 - 1.24 mg/dL 0.91 1.09 0.91  Sodium 135 - 145 mmol/L 135 136 135  Potassium 3.5 - 5.1 mmol/L 3.6 3.2(L) 3.2(L)  Chloride 98 - 111 mmol/L 104 97(L) 102  CO2 22 - 32 mmol/L _1 Calcium 8.9 - 10.3 mg/dL 7.7(L) 8.3(L) 7.9(L)  Total Protein 6.5 - 8.1 g/dL 5.6(L) 6.8 -  Total Bilirubin 0.3 - 1.2 mg/dL 0.5 1.3(H) -  Alkaline Phos 38 - 126 U/L 67 105 -  AST 15 - 41 U/L 17 32 -  ALT 0 - 44 U/L 24 35 -    DIAGNOSTIC IMAGING:  I have independently reviewed the scans and discussed with the patient. NM PET Image Restag (PS) Skull Base To Thigh  Result Date: 03/18/2020 CLINICAL DATA:  Subsequent treatment strategy for stage IV non-small cell lung cancer. Additional history of prostate cancer. EXAM: NUCLEAR MEDICINE PET SKULL BASE TO THIGH TECHNIQUE: 13.2 mCi F-18 FDG was injected intravenously. Full-ring PET imaging was  performed from the skull base to thigh after the radiotracer. CT data was obtained and used for attenuation correction and anatomic localization. Fasting blood glucose: 115 mg/dl COMPARISON:  01/21/2020 PET-CT. FINDINGS: Mediastinal blood pool activity: SUV max 2.9 Liver activity: SUV max NA NECK: No hypermetabolic lymph nodes in the neck. Incidental CT findings: Left subclavian Port-A-Cath terminates in upper third of the SVC. CHEST: Spiculated solid 1.5 cm apical right upper lobe pulmonary nodule with max SUV 4.7 (series 3/image 75), previously 2.0 cm with max SUV 7.3, decreased in size and metabolism. No new hypermetabolic pulmonary findings. Hypermetabolic right hilar and right paratracheal lymphadenopathy is decreased  in size and metabolism. Representative right hilar lymph node with max SUV 4.5, previous max SUV 8.1. Representative 0.8 cm right paratracheal lymph node with max SUV 3.0 (series 3/image 96), previously 1.6 cm with max SUV 4.9. Hypermetabolic 0.9 cm right retrocrural node with max SUV 5.0 (series 3/image 152), previously 0.9 cm with max SUV 5.4, not appreciably changed. No newly enlarged or newly hypermetabolic mediastinal or hilar nodes. Incidental CT findings: Coronary atherosclerosis. Atherosclerotic nonaneurysmal thoracic aorta. ABDOMEN/PELVIS: Hypermetabolic hypodense 1.3 cm segment 6 right liver lesion with max SUV 5.8 (series 3/image 178), previously 2.5 cm with max SUV 6.0, decreased in size and slightly decreased in metabolism. No additional discrete hypermetabolic liver lesions. Right adrenal 1.1 cm nodule with max SUV 6.2, previously 3.0 cm with max SUV 8.8, decreased in size and metabolism. Resolved hypermetabolic left adrenal nodule. No abnormal hypermetabolic activity within the pancreas or spleen. No hypermetabolic lymph nodes in the abdomen or pelvis. Incidental CT findings: Cholecystectomy. Atherosclerotic abdominal aorta with stable ectatic 2.9 cm infrarenal abdominal aorta. Brachytherapy sees throughout the normal size prostate. SKELETON: Widespread hypermetabolic faintly sclerotic lesions throughout the axial skeleton are stable to decreased in metabolism. No new or progressive hypermetabolic bone lesions. Representative bone lesions as follows: -medial right iliac bone lesion with max SUV 4.4, previous max SUV 6.6, decreased -sternal lesion with max SUV 5.4, previous max SUV 8.9, decreased -right T7 vertebral lesion with max SUV 9.6, previous max SUV 9.1, not appreciably changed -lateral right second rib lesion with max SUV 3.2, previous max SUV 7.5, decreased Incidental CT findings: none IMPRESSION: 1. Interval partial metabolic response. No new or progressive hypermetabolic metastatic disease.  2. Spiculated apical right upper lobe pulmonary nodule is decreased in size and metabolism. 3. Right hilar and right paratracheal lymphadenopathy is decreased in size and metabolism. Right retrocrural hypermetabolic nodal metastasis is stable. 4. Segment 6 right liver metastasis is decreased in size and slightly decreased in metabolism. 5. Right adrenal hypermetabolic metastasis is decreased in size and metabolism. Left adrenal metastasis has resolved. 6. Widespread sclerotic hypermetabolic bone metastases are stable to decreased in metabolism as detailed. 7.  Aortic Atherosclerosis (ICD10-I70.0). Electronically Signed   By: Ilona Sorrel M.D.   On: 03/18/2020 13:40   DG Chest Port 1 View  Result Date: 03/02/2020 CLINICAL DATA:  Weakness and dehydration.  History of lung cancer. EXAM: PORTABLE CHEST 1 VIEW COMPARISON:  01/14/2020 FINDINGS: Left subclavian Port-A-Cath unchanged. Lungs are adequately inflated without focal airspace consolidation or effusion. Known right apical nodule not well visualized. Minimal biapical pleural thickening unchanged. Cardiomediastinal silhouette and remainder of the exam is unchanged. IMPRESSION: No acute cardiopulmonary disease. Electronically Signed   By: Marin Olp M.D.   On: 03/02/2020 11:47   ECHOCARDIOGRAM COMPLETE  Result Date: 03/03/2020    ECHOCARDIOGRAM REPORT   Patient Name:   JAIZON DEROOS Date of  Exam: 03/03/2020 Medical Rec #:  [id number]      Height:       73.0 in Accession #:    [id number]     Weight:       191.4 lb Date of Birth:  1945/08/29     BSA:          2.112 m Patient Age:    5 years       BP:           111/78 mmHg Patient Gender: M              HR:           85 bpm. Exam Location:  [facility] Procedure: 2D Echo Indications:    Atrial Fibrillation 427.31/l48.91  History:        Patient has no prior history of Echocardiogram examinations.                 Arrythmias:Atrial Fibrillation; Risk Factors:Dyslipidemia.                 Malignant neoplasm  of right lung, Metastases to the liver,                 Prostate cancer ([place]) (From Hx).  Sonographer:    Alvino Chapel RCS Referring Phys: [id number] [place]  1. Left ventricular ejection fraction, by estimation, is 60 to 65%. The left ventricle has normal function. The left ventricle has no regional wall motion abnormalities. Left ventricular diastolic parameters are indeterminate.  2. Right ventricular systolic function is normal. The right ventricular size is normal.  3. The mitral valve is normal in structure. Mild mitral valve regurgitation.  4. The aortic valve is normal in structure. Aortic valve regurgitation is mild.  5. The inferior vena cava is normal in size with greater than 50% respiratory variability, suggesting right atrial pressure of 3 mmHg. FINDINGS  Left Ventricle: Left ventricular ejection fraction, by estimation, is 60 to 65%. The left ventricle has normal function. The left ventricle has no regional wall motion abnormalities. The left ventricular internal cavity size was normal in size. There is  no left ventricular hypertrophy. Left ventricular diastolic parameters are indeterminate. Right Ventricle: The right ventricular size is normal. Right vetricular wall thickness was not assessed. Right ventricular systolic function is normal. Left Atrium: Left atrial size was normal in size. Right Atrium: Right atrial size was normal in size. Pericardium: There is no evidence of pericardial effusion. Mitral Valve: The mitral valve is normal in structure. Mild mitral valve regurgitation. Tricuspid Valve: The tricuspid valve is normal in structure. Tricuspid valve regurgitation is trivial. Aortic Valve: The aortic valve is normal in structure. Aortic valve regurgitation is mild. Aortic regurgitation PHT measures 543 msec. Pulmonic Valve: The pulmonic valve was not well visualized. Pulmonic valve regurgitation is not visualized. No evidence of pulmonic stenosis. Aorta: The aortic root is  normal in size and structure. Venous: The inferior vena cava is normal in size with greater than 50% respiratory variability, suggesting right atrial pressure of 3 mmHg. IAS/Shunts: No atrial level shunt detected by color flow Doppler.  LEFT VENTRICLE PLAX 2D LVIDd:         3.50 cm  Diastology LVIDs:         2.40 cm  LV e' medial:    7.62 cm/s LV PW:         0.90 cm  LV E/e' medial:  7.5 LV IVS:  0.90 cm  LV e' lateral:   9.46 cm/s LVOT diam:     2.00 cm  LV E/e' lateral: 6.0 LV SV:         72 LV SV Index:   34 LVOT Area:     3.14 cm  RIGHT VENTRICLE TAPSE (M-mode): 1.4 cm LEFT ATRIUM             Index       RIGHT ATRIUM           Index LA diam:        3.00 cm 1.42 cm/m  RA Area:     15.40 cm LA Vol (A2C):   37.9 ml 17.95 ml/m RA Volume:   35.10 ml  16.62 ml/m LA Vol (A4C):   45.4 ml 21.50 ml/m LA Biplane Vol: 41.7 ml 19.75 ml/m  AORTIC VALVE LVOT Vmax:   132.00 cm/s LVOT Vmean:  84.600 cm/s LVOT VTI:    0.228 m AI PHT:      543 msec  AORTA Ao Root diam: 3.60 cm MITRAL VALVE MV Area (PHT): 5.06 cm     SHUNTS MV Decel Time: 150 msec     Systemic VTI:  0.23 m MV E velocity: 57.00 cm/s   Systemic Diam: 2.00 cm MV A velocity: 104.00 cm/s MV E/A ratio:  0.55 Dorris Carnes MD Electronically signed by Dorris Carnes MD Signature Date/Time: 03/03/2020/2:36:55 PM    Final      ASSESSMENT:  1. 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 Jenetta Downer. -CT abdomen on 12/03/2019 showed posterior right lobe lesion measuring 2.1 x 1.9 cm and another anterior liver dome lesion measuring 1.3 x 1.2 cm. Right adrenal mass measuring 3.3 x 1.8 cm. -MRI of the liver on 12/05/2019 showed rim-enhancing lesion of the right adrenal gland measuring 3.0 x 2.5 cm. There is rim-enhancing lesion of the superior pole of the left kidney measuring 1.9 x 1.7 cm. Right lobe of the liver lesion measuring 2.2 x 2.0 cm. Anterior liver dome lesion measuring 1.1 x  0.9 cm. -CT chest without contrast on 12/07/2019 showed 1.9 cm spiculated nodule in the right lung apex. Right paratracheal lymph node 1.3 cm. 1.5 cm lower right paratracheal node. -Right lobe of liver needle biopsy consistent with adenocarcinoma, positive for CK7, TTF-1. Negative for CK20, CDX2, Napsin a and CK 5/6. -PD-L1 TPS 1% -Foundation 1 with TMB high, MS-stable, no other targetable mutations. -PET scan on 01/21/2020 showed 2 cm nodule in the right lung apex, thoracic nodal metastasis.  2.5 cm hepatic meta stasis.  Additional small hepatic lesions incompletely characterized.  Bilateral adrenal meta stasis.  Multifocal bone metastasis throughout the axial and appendicular skeleton. -Cycle 1 of carboplatin, pemetrexed and pembrolizumab started on 01/17/2020.  2. Prostate cancer: -Diagnosed in 2017, seed implants done in June 2018.  3. Social/family history: -He drives a Teacher, English as a foreign language. Quit smoking in 1992, 2 packs/day for 32 years. -Brother had prostate cancer. Sister had skin cancer and mother had throat cancer.  4. Brain metastasis: -MRI of the brain on 12/28/2019 shows bilateral cerebellar metastasis measuring 6 to 7 mm. Enhancing 1.2 cm left clival metastasis. -SRS to the brain lesion on 01/25/2020.   PLAN:  1. 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  Tolerated cycle 3 fair. -He was admitted to the hospital (03/02/20-03/03/20) for new onset A. fib with  RVR along with nausea and vomiting.  He was observed overnight and started on Cardizem. -He was seen again on 03/05/2020 for persistent nausea and vomiting.  Symptoms resolved and he was discharged home. -Reviewed his labs from today.  Albumin is low at 2.8.  CBC is fairly stable.  Mild anemia with a hemoglobin of 10.8.  Low magnesium at 1.5. -He is eating and drinking well since last ED visit. -He has gained 2 pounds since  Thanksgiving. -Reviewed PET scan from 03/17/2020 which shows significant response to treatment.  -Proceed with cycle 4 of carbo, pemetrexed and Keytruda.  2. Brain metastasis: -SRS to the brain lesion completed on 01/25/2020. -No more headaches reported.  3. Generalized pains: -He will use oxycodone 10 mg as needed.  He is not requiring on regular basis.  4.  Uncontrolled nausea/vomiting: -He presented to the emergency room after cycle 3 with non-intractable nausea and vomiting and he was started on Phenergan suppositories. -His nausea has significantly improved since this visit and he denies any additional nausea or vomiting. -He continues his S[place]uso patch. -Continue the patch on Monday and leave it for 5-7 days.  -Continue Phenergan suppository as needed. -He will continue dexamethasone 2 mg in the mornings for nausea. -Would recommend olanzapine 5-10 days post treatment if nausea returns.  5.  Hypomagnesemia: -Lab work from 03/20/2020 show a magnesium level of 1.5. -Discussed magnesium rich foods. -Given he is no longer experiencing nausea or vomiting, patient will try to increase his magnesium levels through his diet.  -We will recheck his lab work at his next visit.  6.  Anemia: -Secondary to treatment. -No active bleeding. -Continue to monitor lab work.  Disposition: -RTC in 3 weeks for repeat labs and cycle 5.  Greater than 50% was spent in counseling and coordination of care with this patient including but not limited to discussion of the relevant topics above (See A&P) including, but not limited to diagnosis and management of acute and chronic medical conditions.   Faythe Casa, NP 03/20/2020 12:49 PM  Orders placed this encounter:  No orders of the defined types were placed in this encounter.  Faythe Casa, NP 03/20/2020 12:49 PM  [place] [phone]

## 2020-03-20 NOTE — Progress Notes (Signed)
Lance Stafford presents today for D1C4 Carbo/Pem/Pem. Pt denies any new changes or symptoms since last treatment. Lab results and vitals have been reviewed and are stable and within parameters for treatment. Patient has been assessed by Faythe Casa, NP, who has approved proceeding with treatment today as planned.  Infusions tolerated without incident or complaint. VSS upon completion of treatment. Port flushed and deaccessed per protocol, see MAR and IV flowsheet for details. Discharged in satisfactory condition with follow up instructions.

## 2020-03-21 ENCOUNTER — Other Ambulatory Visit: Payer: Medicare Other

## 2020-03-21 DIAGNOSIS — C61 Malignant neoplasm of prostate: Secondary | ICD-10-CM

## 2020-03-22 LAB — PSA: Prostate Specific Ag, Serum: <0.1 ng/mL (ref 0.0–4.0)

## 2020-03-26 NOTE — Progress Notes (Signed)
Subjective:  1. History of prostate cancer   2. Urgency of urination      Lance Stafford returns today in f/u for his history of prostate cancer treated with seeds on 09/30/16. His PSA remains low at <0.1 and it was 13.6 preoperatively. He has had LUTS with an IPSS of 3 which is stable.  He is off of tamsulosin.   He has had no hematuria.  UA is clear.   He has no other associated signs or symptoms.   He was diagnosed with stage 4 lung cancer in September and he is currently getting chemotherapy and has had some reduction in the tumor size.   He had lost 60lb but is back up some.  He was having pain in the right chest prior to diagnosis.       ROS:  ROS:  A complete review of systems was performed.  All systems are negative except for pertinent findings as noted.   Review of Systems  Constitutional: Positive for weight loss.  Respiratory: Positive for shortness of breath.   Gastrointestinal: Abdominal pain: He has occasional suprapubic pain with walking.   All other systems reviewed and are negative.   No Known Allergies  Outpatient Encounter Medications as of 03/28/2020  Medication Sig  . aspirin EC 81 MG tablet Take 81 mg by mouth daily.   Marland Kitchen atorvastatin (LIPITOR) 40 MG tablet Take 40 mg by mouth daily.  Marland Kitchen CARBOPLATIN IV Inject into the vein every 21 ( twenty-one) days.  Marland Kitchen dexamethasone (DECADRON) 2 MG tablet Take 1 tablet (2 mg total) by mouth 2 (two) times daily.  Marland Kitchen diltiazem (CARDIZEM CD) 120 MG 24 hr capsule Take 1 capsule (120 mg total) by mouth daily.  Marland Kitchen docusate sodium (COLACE) 100 MG capsule Take 200 mg by mouth daily.  . folic acid (FOLVITE) 1 MG tablet Take 1 tablet (1 mg total) by mouth daily.  . Lactulose 20 GM/30ML SOLN Take 30 ml by mouth every 3 hours until bowel movement is had; then continue taking 30 ml by mouth once daily  . lidocaine-prilocaine (EMLA) cream Apply a small amount to port a cath site and cover with plastic wrap 1 hour prior to chemotherapy  appointments  . metoCLOPramide (REGLAN) 10 MG tablet Take 1 tablet (10 mg total) by mouth every 8 (eight) hours.  . Misc. Devices MISC Bedside Commode  Adjustable bedside table  Shower stool (not shower chair)  Lightweight wheelchair DX: C34.91- Right lung cancer  . omeprazole (PRILOSEC) 40 MG capsule Take 40 mg by mouth daily.  Marland Kitchen oxyCODONE (OXY IR/ROXICODONE) 5 MG immediate release tablet Take 2 tablets (10 mg total) by mouth every 6 (six) hours as needed for severe pain.  Marland Kitchen PEMEtrexed 500 mg/m2 in sodium chloride 0.9 % 100 mL Inject 500 mg/m2 into the vein every 21 ( twenty-one) days.  . potassium chloride SA (KLOR-CON) 20 MEQ tablet Take 1 tablet (20 mEq total) by mouth daily.  . promethazine (PHENERGAN) 25 MG suppository Place 1 suppository (25 mg total) rectally every 6 (six) hours as needed for nausea or vomiting.  . sodium chloride 0.9 % SOLN 50 mL with pembrolizumab 100 MG/4ML SOLN 2 mg/kg Inject 200 mg into the vein every 21 ( twenty-one) days.  . sucralfate (CARAFATE) 1 g tablet Take 1 tablet (1 g total) by mouth 4 (four) times daily -  with meals and at bedtime.  . [DISCONTINUED] nabumetone (RELAFEN) 500 MG tablet nabumetone 500 mg tablet  Take 1 tablet twice a  day by oral route. (Patient not taking: Reported on 03/28/2020)  . [DISCONTINUED] promethazine (PHENERGAN) 25 MG tablet Take 1 tablet (25 mg total) by mouth every 6 (six) hours as needed for nausea or vomiting.   No facility-administered encounter medications on file as of 03/28/2020.    Past Medical History:  Diagnosis Date  . Arthritis   . Chronic low back pain    truck driver  . GERD (gastroesophageal reflux disease)   . Hyperlipidemia   . Lung cancer ([place])    stage IV non small cell lung ca  . Nocturia   . Port-A-Cath in place 01/10/2020  . Prostate cancer ([place]) UROLOGIST-  DR Mykaila Blunck/  ONCOLOGIST-  DR MANNING   dx 02/ 2017via TRUSPbx---  Stage T1c,  Gleason 3+3,  PSA 11.9  . Wears glasses   . Wears partial  dentures    upper and lower    Past Surgical History:  Procedure Laterality Date  . CATARACT EXTRACTION W/ INTRAOCULAR LENS  IMPLANT, BILATERAL  2015  . CHOLECYSTECTOMY    . CYSTOSCOPY  09/30/2016   Procedure: CYSTOSCOPY;  Surgeon: Irine Seal, MD;  Location: [facility];  Service: Urology;;  no seeds found in bladder  . PORTACATH PLACEMENT Left 01/14/2020   Procedure: INSERTION PORT-A-CATH;  Surgeon: Aviva Signs, MD;  Location: [facility];  Service: General;  Laterality: Left;  . RADIOACTIVE SEED IMPLANT N/A 09/30/2016   Procedure: RADIOACTIVE SEED IMPLANT/BRACHYTHERAPY IMPLANT, SPACE OAR;  Surgeon: Irine Seal, MD;  Location: [facility];  Service: Urology;  Laterality: N/A;  70 seeds implanted  . SPERMATOCELECTOMY Left 01/30/2015   Procedure: SPERMATOCELECTOMY;  Surgeon: Irine Seal, MD;  Location: [facility];  Service: Urology;  Laterality: Left;    Social History   Socioeconomic History  . Marital status: Married    Spouse name: Not on file  . Number of children: 2  . Years of education: Not on file  . Highest education level: Not on file  Occupational History  . Occupation: truck Geophysicist/field seismologist  Tobacco Use  . Smoking status: Former Smoker    Packs/day: 2.00    Years: 33.00    Pack years: 66.00    Types: Cigarettes    Quit date: 01/23/1991    Years since quitting: 29.1  . Smokeless tobacco: Never Used  Vaping Use  . Vaping Use: Never used  Substance and Sexual Activity  . Alcohol use: No  . Drug use: No  . Sexual activity: Yes  Other Topics Concern  . Not on file  Social History Narrative  . Not on file   Social Determinants of Health   Financial Resource Strain: Low Risk   . Difficulty of Paying Living Expenses: Not hard at all  Food Insecurity: No Food Insecurity  . Worried About Charity fundraiser in the Last Year: Never true  . Ran Out of Food in the Last Year: Never true  Transportation Needs: No Transportation Needs   . Lack of Transportation (Medical): No  . Lack of Transportation (Non-Medical): No  Physical Activity: Sufficiently Active  . Days of Exercise per Week: 1 day  . Minutes of Exercise per Session: 150+ min  Stress: No Stress Concern Present  . Feeling of Stress : Only a little  Social Connections: Socially Integrated  . Frequency of Communication with Friends and Family: More than three times a week  . Frequency of Social Gatherings with Friends and Family: Three times a week  . Attends Religious  Services: More than 4 times per year  . Active Member of Clubs or Organizations: Yes  . Attends Archivist Meetings: More than 4 times per year  . Marital Status: Married  Human resources officer Violence: Not At Risk  . Fear of Current or Ex-Partner: No  . Emotionally Abused: No  . Physically Abused: No  . Sexually Abused: No    Family History  Problem Relation Age of Onset  . Diabetes Mother   . Stroke Mother   . Throat cancer Mother   . Skin cancer Sister   . Prostate cancer Brother   . Cancer Paternal Grandmother   . Clotting disorder Daughter        Objective: BP (!) 142/90   Pulse (!) 114   Temp (!) 97.5 F (36.4 C)   Ht 6\' 1"  (1.854 m)   Wt 198 lb (89.8 kg)   BMI 26.12 kg/m     Physical Exam  Lab Results:  No results found for this or any previous visit (from the past 24 hour(s)).  BMET No results for input(s): NA, K, CL, CO2, GLUCOSE, BUN, CREATININE, CALCIUM in the last 72 hours. PSA PSA  Date Value Ref Range Status  06/29/2019 <0.1 < OR = 4.0 ng/mL Final    Comment:    The total PSA value from this assay system is  standardized against the WHO standard. The test  result will be approximately 20% lower when compared  to the equimolar-standardized total PSA (Beckman  Coulter). Comparison of serial PSA results should be  interpreted with this fact in mind. . This test was performed using the Siemens  chemiluminescent method. Values obtained from   different assay methods cannot be used interchangeably. PSA levels, regardless of value, should not be interpreted as absolute evidence of the presence or absence of disease.   PSA on 03/21/20 <0.1.    No results found for: TESTOSTERONE  UA is clear.   Studies/Results:     Assessment & Plan: History of prostate cancer.   PSA remains undetectible.  Return in 38mo with a PSA.   BOO with urgency.   His symptoms are minimal off of tamsulosin.      No orders of the defined types were placed in this encounter.    Orders Placed This Encounter  Procedures  . Urinalysis, Routine w reflex microscopic  . PSA    Standing Status:   Future    Standing Expiration Date:   03/28/2021      Return in about 6 months (around 09/26/2020).   CC: [name], NP      Irine Seal 03/28/2020

## 2020-03-28 ENCOUNTER — Ambulatory Visit (INDEPENDENT_AMBULATORY_CARE_PROVIDER_SITE_OTHER): Payer: Medicare Other | Admitting: Urology

## 2020-03-28 ENCOUNTER — Other Ambulatory Visit: Payer: Self-pay

## 2020-03-28 ENCOUNTER — Ambulatory Visit (HOSPITAL_COMMUNITY): Payer: Medicare Other

## 2020-03-28 ENCOUNTER — Encounter: Payer: Self-pay | Admitting: Urology

## 2020-03-28 VITALS — BP 142/90 | HR 114 | Temp 97.5°F | Ht 73.0 in | Wt 198.0 lb

## 2020-03-28 DIAGNOSIS — Z8546 Personal history of malignant neoplasm of prostate: Secondary | ICD-10-CM

## 2020-03-28 DIAGNOSIS — R3915 Urgency of urination: Secondary | ICD-10-CM | POA: Diagnosis not present

## 2020-03-28 LAB — URINALYSIS, ROUTINE W REFLEX MICROSCOPIC
Bilirubin, UA: NEGATIVE
Glucose, UA: NEGATIVE
Leukocytes,UA: NEGATIVE
Nitrite, UA: NEGATIVE
Protein,UA: NEGATIVE
RBC, UA: NEGATIVE
Specific Gravity, UA: 1.015 (ref 1.005–1.030)
Urobilinogen, Ur: 0.2 mg/dL (ref 0.2–1.0)
pH, UA: 5 (ref 5.0–7.5)

## 2020-03-28 NOTE — Progress Notes (Signed)
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  Patient receiving chemotherapy.   Spoke with patient via phone.  Patient reports that his appetite is much better and feels hungry.  Last chemotherapy treatment did not make him sick.  Reports that he has been eating all kinds of foods (chicken, beans, potato salad, fruit, vegetables). Has been drinking mainly water and some sprite.  Reports that he is eating about every 2 hours.  Reports ensure shakes makes him sick.     Medications: dexamethasone  Labs: reviewed  Anthropometrics:   Weight 201 lb at cancer center on 12/9 from 199 lb   NUTRITION DIAGNOSIS: Inadequate oral intake improving   INTERVENTION:  Encouraged patient to continue eating high calorie, high protein foods    MONITORING, EVALUATION, GOAL: weight trends, intake   NEXT VISIT: Jan 28 phone f/u  Tenee Wish B. Zenia Resides, [place], [place] Registered Dietitian [phone] (mobile)

## 2020-03-28 NOTE — Progress Notes (Signed)
Urological Symptom Review  Patient is experiencing the following symptoms: Frequent urination Get up at night to urinate   Review of Systems  Gastrointestinal (upper)  : Negative for upper GI symptoms  Gastrointestinal (lower) : Negative for lower GI symptoms  Constitutional : Weight loss  Skin: Negative for skin symptoms  Eyes: Negative for eye symptoms  Ear/Nose/Throat : Negative for Ear/Nose/Throat symptoms  Hematologic/Lymphatic: Negative for Hematologic/Lymphatic symptoms  Cardiovascular : Negative for cardiovascular symptoms  Respiratory : Shortness of breath  Endocrine: Negative for endocrine symptoms  Musculoskeletal: Negative for musculoskeletal symptoms  Neurological: Negative for neurological symptoms  Psychologic: Negative for psychiatric symptoms

## 2020-03-31 ENCOUNTER — Other Ambulatory Visit (HOSPITAL_COMMUNITY): Payer: Self-pay

## 2020-03-31 DIAGNOSIS — C3491 Malignant neoplasm of unspecified part of right bronchus or lung: Secondary | ICD-10-CM

## 2020-03-31 MED ORDER — OXYCODONE HCL 5 MG PO TABS: 10.0000 mg | ORAL_TABLET | Freq: Four times a day (QID) | ORAL | 0 refills | Status: DC | PRN

## 2020-04-01 ENCOUNTER — Other Ambulatory Visit: Payer: Self-pay | Admitting: Radiation Therapy

## 2020-04-01 DIAGNOSIS — C7949 Secondary malignant neoplasm of other parts of nervous system: Secondary | ICD-10-CM

## 2020-04-05 ENCOUNTER — Other Ambulatory Visit (HOSPITAL_COMMUNITY): Payer: Self-pay | Admitting: Hematology

## 2020-04-10 ENCOUNTER — Inpatient Hospital Stay (HOSPITAL_BASED_OUTPATIENT_CLINIC_OR_DEPARTMENT_OTHER): Payer: Medicare Other | Admitting: Hematology

## 2020-04-10 ENCOUNTER — Inpatient Hospital Stay (HOSPITAL_COMMUNITY): Payer: Medicare Other

## 2020-04-10 ENCOUNTER — Other Ambulatory Visit (HOSPITAL_COMMUNITY): Payer: Self-pay

## 2020-04-10 ENCOUNTER — Other Ambulatory Visit: Payer: Self-pay

## 2020-04-10 VITALS — BP 106/64 | HR 74 | Temp 97.6°F | Resp 18

## 2020-04-10 VITALS — BP 123/90 | HR 87 | Temp 98.4°F | Resp 18

## 2020-04-10 DIAGNOSIS — C3490 Malignant neoplasm of unspecified part of unspecified bronchus or lung: Secondary | ICD-10-CM

## 2020-04-10 DIAGNOSIS — C787 Secondary malignant neoplasm of liver and intrahepatic bile duct: Secondary | ICD-10-CM

## 2020-04-10 DIAGNOSIS — Z5112 Encounter for antineoplastic immunotherapy: Secondary | ICD-10-CM | POA: Diagnosis not present

## 2020-04-10 DIAGNOSIS — C3491 Malignant neoplasm of unspecified part of right bronchus or lung: Secondary | ICD-10-CM

## 2020-04-10 DIAGNOSIS — Z95828 Presence of other vascular implants and grafts: Secondary | ICD-10-CM

## 2020-04-10 LAB — CBC WITH DIFFERENTIAL/PLATELET
Abs Immature Granulocytes: 0.42 10*3/uL — ABNORMAL HIGH (ref 0.00–0.07)
Basophils Absolute: 0 10*3/uL (ref 0.0–0.1)
Basophils Relative: 1 %
Eosinophils Absolute: 0 10*3/uL (ref 0.0–0.5)
Eosinophils Relative: 0 %
HCT: 31.9 % — ABNORMAL LOW (ref 39.0–52.0)
Hemoglobin: 10.2 g/dL — ABNORMAL LOW (ref 13.0–17.0)
Immature Granulocytes: 7 %
Lymphocytes Relative: 27 %
Lymphs Abs: 1.5 10*3/uL (ref 0.7–4.0)
MCH: 33.7 pg (ref 26.0–34.0)
MCHC: 32 g/dL (ref 30.0–36.0)
MCV: 105.3 fL — ABNORMAL HIGH (ref 80.0–100.0)
Monocytes Absolute: 1 10*3/uL (ref 0.1–1.0)
Monocytes Relative: 18 %
Neutro Abs: 2.7 10*3/uL (ref 1.7–7.7)
Neutrophils Relative %: 47 %
Platelets: 100 10*3/uL — ABNORMAL LOW (ref 150–400)
RBC: 3.03 MIL/uL — ABNORMAL LOW (ref 4.22–5.81)
RDW: 23.2 % — ABNORMAL HIGH (ref 11.5–15.5)
WBC: 5.7 10*3/uL (ref 4.0–10.5)
nRBC: 4 % — ABNORMAL HIGH (ref 0.0–0.2)

## 2020-04-10 LAB — COMPREHENSIVE METABOLIC PANEL
ALT: 29 U/L (ref 0–44)
AST: 24 U/L (ref 15–41)
Albumin: 3.4 g/dL — ABNORMAL LOW (ref 3.5–5.0)
Alkaline Phosphatase: 79 U/L (ref 38–126)
Anion gap: 11 (ref 5–15)
BUN: 15 mg/dL (ref 8–23)
CO2: 26 mmol/L (ref 22–32)
Calcium: 8.8 mg/dL — ABNORMAL LOW (ref 8.9–10.3)
Chloride: 97 mmol/L — ABNORMAL LOW (ref 98–111)
Creatinine, Ser: 0.78 mg/dL (ref 0.61–1.24)
GFR, Estimated: >60 mL/min (ref >60)
Glucose, Bld: 117 mg/dL — ABNORMAL HIGH (ref 70–99)
Potassium: 4.5 mmol/L (ref 3.5–5.1)
Sodium: 134 mmol/L — ABNORMAL LOW (ref 135–145)
Total Bilirubin: 0.6 mg/dL (ref 0.3–1.2)
Total Protein: 6.6 g/dL (ref 6.5–8.1)

## 2020-04-10 LAB — MAGNESIUM: Magnesium: 1.7 mg/dL (ref 1.7–2.4)

## 2020-04-10 LAB — TSH: TSH: 3.112 u[IU]/mL (ref 0.350–4.500)

## 2020-04-10 MED ORDER — SODIUM CHLORIDE 0.9 % IV SOLN
150.0000 mg | Freq: Once | INTRAVENOUS | Status: AC
  Administered 2020-04-10: 150 mg via INTRAVENOUS
  Filled 2020-04-10: qty 120

## 2020-04-10 MED ORDER — SODIUM CHLORIDE 0.9 % IV SOLN
10.0000 mg | Freq: Once | INTRAVENOUS | Status: AC
  Administered 2020-04-10: 10:00:00 10 mg via INTRAVENOUS
  Filled 2020-04-10: qty 10

## 2020-04-10 MED ORDER — SODIUM CHLORIDE 0.9 % IV SOLN
490.0000 mg | Freq: Once | INTRAVENOUS | Status: AC
  Administered 2020-04-10: 12:00:00 490 mg via INTRAVENOUS
  Filled 2020-04-10: qty 49

## 2020-04-10 MED ORDER — HEPARIN SOD (PORK) LOCK FLUSH 100 UNIT/ML IV SOLN
500.0000 [IU] | Freq: Once | INTRAVENOUS | Status: AC | PRN
  Administered 2020-04-10: 12:00:00 500 [IU]

## 2020-04-10 MED ORDER — SODIUM CHLORIDE 0.9 % IV SOLN
200.0000 mg | Freq: Once | INTRAVENOUS | Status: AC
  Administered 2020-04-10: 11:00:00 200 mg via INTRAVENOUS
  Filled 2020-04-10: qty 8

## 2020-04-10 MED ORDER — PALONOSETRON HCL INJECTION 0.25 MG/5ML
0.2500 mg | Freq: Once | INTRAVENOUS | Status: AC
  Administered 2020-04-10: 10:00:00 0.25 mg via INTRAVENOUS
  Filled 2020-04-10: qty 5

## 2020-04-10 MED ORDER — OXYCODONE HCL 5 MG PO TABS: 10.0000 mg | ORAL_TABLET | Freq: Four times a day (QID) | ORAL | 0 refills | Status: DC | PRN

## 2020-04-10 MED ORDER — SODIUM CHLORIDE 0.9% FLUSH
10.0000 mL | INTRAVENOUS | Status: DC | PRN
  Administered 2020-04-10 (×2): 10 mL

## 2020-04-10 MED ORDER — SODIUM CHLORIDE 0.9 % IV SOLN
Freq: Once | INTRAVENOUS | Status: AC
Start: 2020-04-10 — End: 2020-04-10

## 2020-04-10 MED ORDER — SODIUM CHLORIDE 0.9 % IV SOLN
500.0000 mg/m2 | Freq: Once | INTRAVENOUS | Status: AC
  Administered 2020-04-10: 11:00:00 1100 mg via INTRAVENOUS
  Filled 2020-04-10: qty 40

## 2020-04-10 NOTE — Progress Notes (Signed)
Patients port flushed without difficulty.  Good blood return noted with no bruising or swelling noted at site.  Transparent dressing applied.  Patient left accessed for chemotherapy treatment. 

## 2020-04-10 NOTE — Patient Instructions (Signed)
[facility] Discharge Instructions for Patients Receiving Chemotherapy   Beginning January 23rd 2017 lab work for the [facility] will be done in the  Main lab at [facility] on 1st floor. If you have a lab appointment with the [facility] please come in thru the  Main Entrance and check in at the main information desk   Today you received the following chemotherapy agents Carboplatin,Alimta and Keytruda. Follow-up as sheduled  To help prevent nausea and vomiting after your treatment, we encourage you to take your nausea medication   If you develop nausea and vomiting, or diarrhea that is not controlled by your medication, call the clinic.  The clinic phone number is [phone]. Office hours are Monday-Friday 8:30am-5:00pm.  BELOW ARE SYMPTOMS THAT SHOULD BE REPORTED IMMEDIATELY:  *FEVER GREATER THAN 101.0 F  *CHILLS WITH OR WITHOUT FEVER  NAUSEA AND VOMITING THAT IS NOT CONTROLLED WITH YOUR NAUSEA MEDICATION  *UNUSUAL SHORTNESS OF BREATH  *UNUSUAL BRUISING OR BLEEDING  TENDERNESS IN MOUTH AND THROAT WITH OR WITHOUT PRESENCE OF ULCERS  *URINARY PROBLEMS  *BOWEL PROBLEMS  UNUSUAL RASH Items with * indicate a potential emergency and should be followed up as soon as possible. If you have an emergency after office hours please contact your primary care physician or go to the nearest emergency department.  Please call the clinic during office hours if you have any questions or concerns.   You may also contact the Patient Navigator at [phone] should you have any questions or need assistance in obtaining follow up care.      Resources For Cancer Patients and their Caregivers ? American Cancer Society: Can assist with transportation, wigs, general needs, runs Look Good Feel Better.        831-712-9333 ? Cancer Care: Provides financial assistance, online support groups, medication/co-pay assistance.  1-800-813-HOPE 253-282-5388) ? [employer] Assists [place] cancer patients and their families through emotional , educational and financial support.  320-465-1177 ? [place] DSS Where to apply for food stamps, Medicaid and utility assistance. 985-806-2227 ? [employer]: Transportation to medical appointments. (773) 434-0027 ? Social Security Administration: May apply for disability if have a Stage IV cancer. 361-176-9578 865-297-3800 ? [employer], Disability and Transit Services: Assists with nutrition, care and transit needs. [phone]

## 2020-04-10 NOTE — Progress Notes (Signed)
[facility] Labs reviewed with and pt seen by Dr. Delton Coombes and pt approved for Alimta,Carboplatin and Keytruda infusions today per MD.                                                                                            [place] Levels tolerated chemo tx well without complaints or incident. VSS upon discharge. Pt discharged self ambulatory using his walker in satisfactory condition accompanied by his wife

## 2020-04-10 NOTE — Patient Instructions (Signed)
[facility] Discharge Instructions  You were seen today by Dr. Katragadda. He went over your recent results. You received your treatment today. Dr. Katragadda will see you back in 3 weeks for labs and follow up.   Thank you for choosing [facility] at [facility] to provide your oncology and hematology care.  To afford each patient quality time with our provider, please arrive at least 15 minutes before your scheduled appointment time.   If you have a lab appointment with the Cancer Center please come in thru the Main Entrance and check in at the main information desk  You need to re-schedule your appointment should you arrive 10 or more minutes late.  We strive to give you quality time with our providers, and arriving late affects you and other patients whose appointments are after yours.  Also, if you no show three or more times for appointments you may be dismissed from the clinic at the providers discretion.     Again, thank you for choosing [facility].  Our hope is that these requests will decrease the amount of time that you wait before being seen by our physicians.       _____________________________________________________________  Should you have questions after your visit to [facility], please contact our office at [phone] between the hours of 8:00 a.m. and 4:30 p.m.  Voicemails left after 4:00 p.m. will not be returned until the following business day.  For prescription refill requests, have your pharmacy contact our office and allow 72 hours.    Cancer Center Support Programs:   > Cancer Support Group  2nd Tuesday of the month 1pm-2pm, Journey Room    

## 2020-04-10 NOTE — Progress Notes (Signed)
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 [facility]     01/17/2020 -  Chemotherapy   The patient had palonosetron (ALOXI) injection 0.25 mg, 0.25 mg, Intravenous,  Once, 4 of 4 cycles Administration: 0.25 mg (01/17/2020), 0.25 mg (02/28/2020), 0.25 mg (03/20/2020), 0.25 mg (02/07/2020) PEMEtrexed (ALIMTA) 1,100 mg in sodium chloride 0.9 % 100 mL chemo infusion, 500 mg/m2 = 1,100 mg, Intravenous,  Once, 4 of 6 cycles Administration: 1,100 mg (01/17/2020), 1,100 mg (02/28/2020), 1,100 mg (03/20/2020), 1,100 mg (02/07/2020) CARBOplatin (PARAPLATIN) 590 mg in sodium chloride 0.9 % 250 mL chemo infusion, 590 mg (100 % of original dose 588.5 mg), Intravenous,  Once, 4 of 4 cycles Dose modification:   (original dose 588.5 mg, Cycle 1),   (original dose 612 mg, Cycle 3),   (original dose 612 mg, Cycle 4),   (original dose 612 mg, Cycle 2) Administration: 590 mg (01/17/2020), 590 mg (02/28/2020), 590 mg (03/20/2020), 590 mg (02/07/2020) fosaprepitant (EMEND) 150 mg in sodium chloride 0.9 % 145 mL IVPB, 150 mg, Intravenous,  Once, 4 of 4 cycles Administration: 150 mg (01/17/2020), 150 mg (02/28/2020), 150 mg (03/20/2020), 150 mg  (02/07/2020) pembrolizumab (KEYTRUDA) 200 mg in sodium chloride 0.9 % 50 mL chemo infusion, 200 mg, Intravenous, Once, 4 of 6 cycles Administration: 200 mg (01/17/2020), 200 mg (02/28/2020), 200 mg (03/20/2020), 200 mg (02/07/2020)  for chemotherapy treatment.      CANCER STAGING: Cancer Staging Malignant neoplasm of lung [facility]) Staging form: Lung, AJCC 8th Edition - Clinical: Stage IVB (cT1b, cN2, pM1c) - Signed by Lance Jack, MD on 12/20/2019   INTERVAL HISTORY:  [name], a 74 y.o. male, returns for routine follow-up and consideration for next cycle of chemotherapy. Lance Stafford was last seen on 02/28/2020.  Due for cycle #5 of Keytruda and pemetrexed today.   Today he is accompanied by his wife. Overall, he tells me he has been feeling pretty well. He tolerated the previous treatment well and denies having nausea. He reports having CP when he eats and SOB when he is active. He denies having diarrhea or skin rash or itching, though he reports having dry skin on his upper back. He complains of having right lower back pain. He is taking oxycodone 10 mg every 6 hours. His last visit to the ED was on 11/24 and has not had any issues since he started taking Reglan 10 mg every 8 hours and Carafate 1 g QID. His wife has been trying to feed him smaller meals every 2 hours. He takes folic acid daily.  He is trying to be active at home and get to the garage to work as much is tolerable.  Overall, he feels ready for  next cycle of chemo today.    REVIEW OF SYSTEMS:  Review of Systems  Constitutional: Positive for fatigue (50%). Negative for appetite change.  Respiratory: Positive for shortness of breath (w/ exertion).   Cardiovascular: Positive for chest pain (w/ food).  Musculoskeletal: Positive for back pain (R lower back pain).  All other systems reviewed and are negative.   PAST MEDICAL/SURGICAL HISTORY:  Past Medical History:  Diagnosis Date  . Arthritis   .  Chronic low back pain    truck driver  . GERD (gastroesophageal reflux disease)   . Hyperlipidemia   . Lung cancer ([place])    stage IV non small cell lung ca  . Nocturia   . Port-A-Cath in place 01/10/2020  . Prostate cancer ([place]) UROLOGIST-  Lance Stafford/  ONCOLOGIST-  Lance Stafford   dx 02/ 2017via TRUSPbx---  Stage T1c,  Gleason 3+3,  PSA 11.9  . Wears glasses   . Wears partial dentures    upper and lower   Past Surgical History:  Procedure Laterality Date  . CATARACT EXTRACTION W/ INTRAOCULAR LENS  IMPLANT, BILATERAL  2015  . CHOLECYSTECTOMY    . CYSTOSCOPY  09/30/2016   Procedure: CYSTOSCOPY;  Surgeon: Lance Seal, MD;  Location: [facility];  Service: Urology;;  no seeds found in bladder  . PORTACATH PLACEMENT Left 01/14/2020   Procedure: INSERTION PORT-A-CATH;  Surgeon: Lance Signs, MD;  Location: [facility];  Service: General;  Laterality: Left;  . RADIOACTIVE SEED IMPLANT N/A 09/30/2016   Procedure: RADIOACTIVE SEED IMPLANT/BRACHYTHERAPY IMPLANT, SPACE OAR;  Surgeon: Lance Seal, MD;  Location[facility];  Service: Urology;  Laterality: N/A;  70 seeds implanted  . SPERMATOCELECTOMY Left 01/30/2015   Procedure: SPERMATOCELECTOMY;  Surgeon: Lance Seal, MD;  Location: [facility];  Service: Urology;  Laterality: Left;    SOCIAL HISTORY:  Social History   Socioeconomic History  . Marital status: Married    Spouse name: Not on file  . Number of children: 2  . Years of education: Not on file  . Highest education level: Not on file  Occupational History  . Occupation: truck Geophysicist/field seismologist  Tobacco Use  . Smoking status: Former Smoker    Packs/day: 2.00    Years: 33.00    Pack years: 66.00    Types: Cigarettes    Quit date: 01/23/1991    Years since quitting: 29.2  . Smokeless tobacco: Never Used  Vaping Use  . Vaping Use: Never used  Substance and Sexual Activity  . Alcohol use: No  . Drug use: No  . Sexual activity: Yes  Other Topics  Concern  . Not on file  Social History Narrative  . Not on file   Social Determinants of Health   Financial Resource Strain: Low Risk   . Difficulty of Paying Living Expenses: Not hard at all  Food Insecurity: No Food Insecurity  . Worried About Charity fundraiser in the Last Year: Never true  . Ran Out of Food in the Last Year: Never true  Transportation Needs: No Transportation Needs  . Lack of Transportation (Medical): No  . Lack of Transportation (Non-Medical): No  Physical Activity: Sufficiently Active  . Days of Exercise per Week: 1 day  . Minutes of Exercise per Session: 150+ min  Stress: No Stress Concern Present  . Feeling of Stress : Only a little  Social Connections: Socially Integrated  . Frequency of Communication with Friends and Family: More than three times a  week  . Frequency of Social Gatherings with Friends and Family: Three times a week  . Attends Religious Services: More than 4 times per year  . Active Member of Clubs or Organizations: Yes  . Attends Archivist Meetings: More than 4 times per year  . Marital Status: Married  Human resources officer Violence: Not At Risk  . Fear of Current or Ex-Partner: No  . Emotionally Abused: No  . Physically Abused: No  . Sexually Abused: No    FAMILY HISTORY:  Family History  Problem Relation Age of Onset  . Diabetes Mother   . Stroke Mother   . Throat cancer Mother   . Skin cancer Sister   . Prostate cancer Brother   . Cancer Paternal Grandmother   . Clotting disorder Daughter     CURRENT MEDICATIONS:  Current Outpatient Medications  Medication Sig Dispense Refill  . aspirin EC 81 MG tablet Take 81 mg by mouth daily.     Marland Kitchen atorvastatin (LIPITOR) 40 MG tablet Take 40 mg by mouth daily.    Marland Kitchen CARBOPLATIN IV Inject into the vein every 21 ( twenty-one) days.    Marland Kitchen dexamethasone (DECADRON) 2 MG tablet Take 1 tablet (2 mg total) by mouth 2 (two) times daily. 60 tablet 1  . diltiazem (CARDIZEM CD) 120 MG 24  hr capsule Take 1 capsule (120 mg total) by mouth daily. 30 capsule 11  . docusate sodium (COLACE) 100 MG capsule Take 200 mg by mouth daily.    . folic acid (FOLVITE) 1 MG tablet Take 1 tablet (1 mg total) by mouth daily. 90 tablet 1  . KLOR-CON M20 20 MEQ tablet TAKE 1 TABLET BY MOUTH EVERY DAY 30 tablet 1  . Lactulose 20 GM/30ML SOLN Take 30 ml by mouth every 3 hours until bowel movement is had; then continue taking 30 ml by mouth once daily 450 mL 2  . lidocaine-prilocaine (EMLA) cream Apply a small amount to port a cath site and cover with plastic wrap 1 hour prior to chemotherapy appointments 30 g 3  . metoCLOPramide (REGLAN) 10 MG tablet Take 1 tablet (10 mg total) by mouth every 8 (eight) hours. 90 tablet 1  . Misc. Devices MISC Bedside Commode  Adjustable bedside table  Shower stool (not shower chair)  Lightweight wheelchair DX: C34.91- Right lung cancer 1 each 99  . omeprazole (PRILOSEC) 40 MG capsule Take 40 mg by mouth daily.    Marland Kitchen oxyCODONE (OXY IR/ROXICODONE) 5 MG immediate release tablet Take 2 tablets (10 mg total) by mouth every 6 (six) hours as needed for severe pain. 120 tablet 0  . PEMEtrexed 500 mg/m2 in sodium chloride 0.9 % 100 mL Inject 500 mg/m2 into the vein every 21 ( twenty-one) days.    . promethazine (PHENERGAN) 25 MG suppository Place 1 suppository (25 mg total) rectally every 6 (six) hours as needed for nausea or vomiting. 12 each 0  . sodium chloride 0.9 % SOLN 50 mL with pembrolizumab 100 MG/4ML SOLN 2 mg/kg Inject 200 mg into the vein every 21 ( twenty-one) days.    . sucralfate (CARAFATE) 1 g tablet Take 1 tablet (1 g total) by mouth 4 (four) times daily -  with meals and at bedtime. 60 tablet 1   No current facility-administered medications for this visit.    ALLERGIES:  No Known Allergies  PHYSICAL EXAM:  Performance status (ECOG): 1 - Symptomatic but completely ambulatory  Vitals:   04/10/20 0823  BP: 123/90  Pulse:  87  Resp: 18  Temp: 98.4 F  (36.9 C)  SpO2: 98%   Wt Readings from Last 3 Encounters:  04/10/20 208 lb 8.9 oz (94.6 kg)  03/28/20 198 lb (89.8 kg)  03/20/20 201 lb 1.6 oz (91.2 kg)   Physical Exam Vitals reviewed.  Constitutional:      Appearance: Normal appearance.  Cardiovascular:     Rate and Rhythm: Normal rate and regular rhythm.     Pulses: Normal pulses.     Heart sounds: Normal heart sounds.  Pulmonary:     Effort: Pulmonary effort is normal.     Breath sounds: Normal breath sounds.  Chest:     Comments: Port-a-Cath in L chest Musculoskeletal:     Right lower leg: No edema.     Left lower leg: No edema.  Neurological:     General: No focal deficit present.     Mental Status: He is alert and oriented to person, place, and time.  Psychiatric:        Mood and Affect: Mood normal.        Behavior: Behavior normal.     LABORATORY DATA:  I have reviewed the labs as listed.  CBC Latest Ref Rng & Units 03/20/2020 03/05/2020 03/03/2020  WBC 4.0 - 10.5 K/uL 8.1 2.5(L) 4.9  Hemoglobin 13.0 - 17.0 g/dL 10.8(L) 13.9 13.2  Hematocrit 39.0 - 52.0 % 33.4(L) 40.4 38.4(L)  Platelets 150 - 400 K/uL 165 208 283   CMP Latest Ref Rng & Units 04/10/2020 03/20/2020 03/05/2020  Glucose 70 - 99 mg/dL 117(H) 105(H) 116(H)  BUN 8 - 23 mg/dL _0 Creatinine 0.61 - 1.24 mg/dL 0.78 0.91 1.09  Sodium 135 - 145 mmol/L 134(L) 135 136  Potassium 3.5 - 5.1 mmol/L 4.5 3.6 3.2(L)  Chloride 98 - 111 mmol/L 97(L) 104 97(L)  CO2 22 - 32 mmol/L _1 Calcium 8.9 - 10.3 mg/dL 8.8(L) 7.7(L) 8.3(L)  Total Protein 6.5 - 8.1 g/dL 6.6 5.6(L) 6.8  Total Bilirubin 0.3 - 1.2 mg/dL 0.6 0.5 1.3(H)  Alkaline Phos 38 - 126 U/L 79 67 105  AST 15 - 41 U/L 24 17 32  ALT 0 - 44 U/L 29 24 35    DIAGNOSTIC IMAGING:  I have independently reviewed the scans and discussed with the patient. NM PET Image Restag (PS) Skull Base To Thigh  Result Date: 03/18/2020 CLINICAL DATA:  Subsequent treatment strategy for stage IV non-small cell  lung cancer. Additional history of prostate cancer. EXAM: NUCLEAR MEDICINE PET SKULL BASE TO THIGH TECHNIQUE: 13.2 mCi F-18 FDG was injected intravenously. Full-ring PET imaging was performed from the skull base to thigh after the radiotracer. CT data was obtained and used for attenuation correction and anatomic localization. Fasting blood glucose: 115 mg/dl COMPARISON:  01/21/2020 PET-CT. FINDINGS: Mediastinal blood pool activity: SUV max 2.9 Liver activity: SUV max NA NECK: No hypermetabolic lymph nodes in the neck. Incidental CT findings: Left subclavian Port-A-Cath terminates in upper third of the SVC. CHEST: Spiculated solid 1.5 cm apical right upper lobe pulmonary nodule with max SUV 4.7 (series 3/image 75), previously 2.0 cm with max SUV 7.3, decreased in size and metabolism. No new hypermetabolic pulmonary findings. Hypermetabolic right hilar and right paratracheal lymphadenopathy is decreased in size and metabolism. Representative right hilar lymph node with max SUV 4.5, previous max SUV 8.1. Representative 0.8 cm right paratracheal lymph node with max SUV 3.0 (series 3/image 96), previously 1.6 cm with max SUV 4.9. Hypermetabolic 0.9  cm right retrocrural node with max SUV 5.0 (series 3/image 152), previously 0.9 cm with max SUV 5.4, not appreciably changed. No newly enlarged or newly hypermetabolic mediastinal or hilar nodes. Incidental CT findings: Coronary atherosclerosis. Atherosclerotic nonaneurysmal thoracic aorta. ABDOMEN/PELVIS: Hypermetabolic hypodense 1.3 cm segment 6 right liver lesion with max SUV 5.8 (series 3/image 178), previously 2.5 cm with max SUV 6.0, decreased in size and slightly decreased in metabolism. No additional discrete hypermetabolic liver lesions. Right adrenal 1.1 cm nodule with max SUV 6.2, previously 3.0 cm with max SUV 8.8, decreased in size and metabolism. Resolved hypermetabolic left adrenal nodule. No abnormal hypermetabolic activity within the pancreas or spleen. No  hypermetabolic lymph nodes in the abdomen or pelvis. Incidental CT findings: Cholecystectomy. Atherosclerotic abdominal aorta with stable ectatic 2.9 cm infrarenal abdominal aorta. Brachytherapy sees throughout the normal size prostate. SKELETON: Widespread hypermetabolic faintly sclerotic lesions throughout the axial skeleton are stable to decreased in metabolism. No new or progressive hypermetabolic bone lesions. Representative bone lesions as follows: -medial right iliac bone lesion with max SUV 4.4, previous max SUV 6.6, decreased -sternal lesion with max SUV 5.4, previous max SUV 8.9, decreased -right T7 vertebral lesion with max SUV 9.6, previous max SUV 9.1, not appreciably changed -lateral right second rib lesion with max SUV 3.2, previous max SUV 7.5, decreased Incidental CT findings: none IMPRESSION: 1. Interval partial metabolic response. No new or progressive hypermetabolic metastatic disease. 2. Spiculated apical right upper lobe pulmonary nodule is decreased in size and metabolism. 3. Right hilar and right paratracheal lymphadenopathy is decreased in size and metabolism. Right retrocrural hypermetabolic nodal metastasis is stable. 4. Segment 6 right liver metastasis is decreased in size and slightly decreased in metabolism. 5. Right adrenal hypermetabolic metastasis is decreased in size and metabolism. Left adrenal metastasis has resolved. 6. Widespread sclerotic hypermetabolic bone metastases are stable to decreased in metabolism as detailed. 7.  Aortic Atherosclerosis (ICD10-I70.0). Electronically Signed   By: Ilona Sorrel M.D.   On: 03/18/2020 13:40     ASSESSMENT:  1. 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 Jenetta Downer. -CT abdomen on 12/03/2019 showed posterior right lobe lesion measuring 2.1 x 1.9 cm and another anterior liver dome lesion measuring 1.3 x 1.2 cm. Right adrenal mass measuring 3.3 x 1.8  cm. -MRI of the liver on 12/05/2019 showed rim-enhancing lesion of the right adrenal gland measuring 3.0 x 2.5 cm. There is rim-enhancing lesion of the superior pole of the left kidney measuring 1.9 x 1.7 cm. Right lobe of the liver lesion measuring 2.2 x 2.0 cm. Anterior liver dome lesion measuring 1.1 x 0.9 cm. -CT chest without contrast on 12/07/2019 showed 1.9 cm spiculated nodule in the right lung apex. Right paratracheal lymph node 1.3 cm. 1.5 cm lower right paratracheal node. -Right lobe of liver needle biopsy consistent with adenocarcinoma, positive for CK7, TTF-1. Negative for CK20, CDX2, Napsin a and CK 5/6. -PD-L1 TPS 1% -Foundation 1 with TMB high, MS-stable, no other targetable mutations. -PET scan on 01/21/2020 showed 2 cm nodule in the right lung apex, thoracic nodal metastasis. 2.5 cm hepatic meta stasis. Additional small hepatic lesions incompletely characterized. Bilateral adrenal meta stasis. Multifocal bone metastasis throughout the axial and appendicular skeleton. -Cycle 1 of carboplatin, pemetrexed and pembrolizumab started on 01/17/2020. -PET scan on 03/17/2020 showing interval partial metabolic response with no new or progressive hypermetabolic metastatic disease.  2. Prostate cancer: -Diagnosed in 2017, seed implants done in  June 2018.  3. Social/family history: -He drives a Teacher, English as a foreign language. Quit smoking in 1992, 2 packs/day for 32 years. -Brother had prostate cancer. Sister had skin cancer and mother had throat cancer.  4. Brain metastasis: -MRI of the brain on 12/28/2019 shows bilateral cerebellar metastasis measuring 6 to 7 mm. Enhancing 1.2 cm left clival metastasis. -SRS to the brain lesion on 01/25/2020.   PLAN:  1. 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  He did not have to have ER visits. -Reviewed labs.  Platelet count is 100 with normal ANC.  LFTs are normal.  I have also reviewed PET  scan results with the patient and his wife. -TSH was 3.11.  Proceed with cycle 5 of chemotherapy today.  We will discontinue carboplatin after cycle 6 and maintain pemetrexed and pembrolizumab as maintenance therapy.  2. Brain metastasis: -SRS to the brain lesion completed on 01/25/2020.  No more headaches reported. -He has repeat MRI on 05/01/2020.  3. Generalized pains: -Continue oxycodone 10 mg every 6 hours as needed.  4.  Uncontrolled nausea/vomiting: -He is using Reglan 10 mg every 8 hours along with Carafate every 4 hours.  Nausea is under control.   Orders placed this encounter:  Orders Placed This Encounter  Procedures  . Comprehensive metabolic panel  . CBC with Differential/Platelet  . Magnesium  . TSH     Lance Jack, MD [place] [phone]   I, Milinda Antis, am acting as a scribe for Lance. Sanda Linger.  I, Lance Jack MD, have reviewed the above documentation for accuracy and completeness, and I agree with the above.

## 2020-04-14 ENCOUNTER — Other Ambulatory Visit: Payer: Self-pay | Admitting: Radiation Therapy

## 2020-04-14 DIAGNOSIS — C7949 Secondary malignant neoplasm of other parts of nervous system: Secondary | ICD-10-CM

## 2020-04-14 DIAGNOSIS — C7931 Secondary malignant neoplasm of brain: Secondary | ICD-10-CM

## 2020-04-22 ENCOUNTER — Other Ambulatory Visit (HOSPITAL_COMMUNITY): Payer: Self-pay | Admitting: Surgery

## 2020-04-22 DIAGNOSIS — C3491 Malignant neoplasm of unspecified part of right bronchus or lung: Secondary | ICD-10-CM

## 2020-04-22 MED ORDER — SUCRALFATE 1 G PO TABS: 1.0000 g | ORAL_TABLET | Freq: Three times a day (TID) | ORAL | 1 refills | Status: DC

## 2020-04-23 NOTE — Progress Notes (Signed)
  Radiation Oncology         [phone]) [phone] ________________________________  Name: Lance Stafford MRN: [id number]  Date: 01/31/2020  DOB: April 11, 1946  End of Treatment Note  Diagnosis:    75 y.o. man with six subcentimeter brain metastases from adenocarcinoma of the right upper lung - Stage IV     Indication for treatment:  Palliation       Radiation treatment dates:   01/31/20  Site/dose/beams/energy:   Craige Cotta received stereotactic radiosurgery to the following targets: Six brain metastasis targets using a single isocenter: PTV1 Rt Cerebellum 65mm PTV2 Lt Cerebellum 83mm PTV3 Lt Temporal 38mm PTV4 Lt Cerebellum 44mm PTV5 Rt Cerebellum 25mm PTV6 Rt Frontal 4mm These were treated using 6 Rapid Arc VMAT Beams to a prescription dose of 20 Gy.  ExacTrac registration was performed for each couch angle.  The 100% isodose line was prescribed with 123.5% hotspot.  6 MV X-rays were delivered in the flattening filter free beam mode.  Narrative: The patient tolerated radiation treatment relatively well.     Plan: The patient has completed radiation treatment. The patient will return to radiation oncology clinic for routine followup in one month. I advised him to call or return sooner if he has any questions or concerns related to his recovery or treatment. ________________________________  Sheral Apley. Tammi Klippel, M.D.

## 2020-04-30 ENCOUNTER — Other Ambulatory Visit (HOSPITAL_COMMUNITY): Payer: Self-pay | Admitting: Hematology

## 2020-05-01 ENCOUNTER — Inpatient Hospital Stay (HOSPITAL_BASED_OUTPATIENT_CLINIC_OR_DEPARTMENT_OTHER): Payer: Medicare Other | Admitting: Hematology

## 2020-05-01 ENCOUNTER — Other Ambulatory Visit: Payer: Self-pay

## 2020-05-01 ENCOUNTER — Inpatient Hospital Stay (HOSPITAL_COMMUNITY): Payer: Medicare Other | Attending: Hematology and Oncology

## 2020-05-01 ENCOUNTER — Encounter (HOSPITAL_COMMUNITY): Payer: Self-pay | Admitting: Hematology

## 2020-05-01 ENCOUNTER — Inpatient Hospital Stay (HOSPITAL_COMMUNITY): Payer: Medicare Other

## 2020-05-01 VITALS — BP 127/75 | HR 78 | Temp 96.8°F | Resp 18 | Wt 210.8 lb

## 2020-05-01 DIAGNOSIS — C3491 Malignant neoplasm of unspecified part of right bronchus or lung: Secondary | ICD-10-CM | POA: Insufficient documentation

## 2020-05-01 DIAGNOSIS — C7972 Secondary malignant neoplasm of left adrenal gland: Secondary | ICD-10-CM | POA: Diagnosis not present

## 2020-05-01 DIAGNOSIS — C7971 Secondary malignant neoplasm of right adrenal gland: Secondary | ICD-10-CM | POA: Diagnosis not present

## 2020-05-01 DIAGNOSIS — C787 Secondary malignant neoplasm of liver and intrahepatic bile duct: Secondary | ICD-10-CM | POA: Insufficient documentation

## 2020-05-01 DIAGNOSIS — Z87891 Personal history of nicotine dependence: Secondary | ICD-10-CM | POA: Diagnosis not present

## 2020-05-01 DIAGNOSIS — C7931 Secondary malignant neoplasm of brain: Secondary | ICD-10-CM | POA: Insufficient documentation

## 2020-05-01 DIAGNOSIS — Z79899 Other long term (current) drug therapy: Secondary | ICD-10-CM | POA: Diagnosis not present

## 2020-05-01 DIAGNOSIS — C7951 Secondary malignant neoplasm of bone: Secondary | ICD-10-CM | POA: Diagnosis not present

## 2020-05-01 DIAGNOSIS — Z95828 Presence of other vascular implants and grafts: Secondary | ICD-10-CM

## 2020-05-01 DIAGNOSIS — C3490 Malignant neoplasm of unspecified part of unspecified bronchus or lung: Secondary | ICD-10-CM

## 2020-05-01 LAB — CBC WITH DIFFERENTIAL/PLATELET
Basophils Absolute: 0 10*3/uL (ref 0.0–0.1)
Basophils Relative: 0 %
Eosinophils Absolute: 0 10*3/uL (ref 0.0–0.5)
Eosinophils Relative: 0 %
HCT: 28.3 % — ABNORMAL LOW (ref 39.0–52.0)
Hemoglobin: 9.1 g/dL — ABNORMAL LOW (ref 13.0–17.0)
Lymphocytes Relative: 30 %
Lymphs Abs: 1.3 10*3/uL (ref 0.7–4.0)
MCH: 35.3 pg — ABNORMAL HIGH (ref 26.0–34.0)
MCHC: 32.2 g/dL (ref 30.0–36.0)
MCV: 109.7 fL — ABNORMAL HIGH (ref 80.0–100.0)
Metamyelocytes Relative: 1 %
Monocytes Absolute: 0.6 10*3/uL (ref 0.1–1.0)
Monocytes Relative: 14 %
Myelocytes: 2 %
Neutro Abs: 2.1 10*3/uL (ref 1.7–7.7)
Neutrophils Relative %: 49 %
Platelets: 47 10*3/uL — ABNORMAL LOW (ref 150–400)
Promyelocytes Relative: 3 %
RBC: 2.58 MIL/uL — ABNORMAL LOW (ref 4.22–5.81)
RDW: 22.7 % — ABNORMAL HIGH (ref 11.5–15.5)
WBC: 4.4 10*3/uL (ref 4.0–10.5)

## 2020-05-01 LAB — COMPREHENSIVE METABOLIC PANEL
ALT: 30 U/L (ref 0–44)
AST: 28 U/L (ref 15–41)
Albumin: 2.9 g/dL — ABNORMAL LOW (ref 3.5–5.0)
Alkaline Phosphatase: 85 U/L (ref 38–126)
Anion gap: 9 (ref 5–15)
BUN: 14 mg/dL (ref 8–23)
CO2: 26 mmol/L (ref 22–32)
Calcium: 8.3 mg/dL — ABNORMAL LOW (ref 8.9–10.3)
Chloride: 98 mmol/L (ref 98–111)
Creatinine, Ser: 0.79 mg/dL (ref 0.61–1.24)
GFR, Estimated: >60 mL/min (ref >60)
Glucose, Bld: 120 mg/dL — ABNORMAL HIGH (ref 70–99)
Potassium: 3.8 mmol/L (ref 3.5–5.1)
Sodium: 133 mmol/L — ABNORMAL LOW (ref 135–145)
Total Bilirubin: 0.3 mg/dL (ref 0.3–1.2)
Total Protein: 6.1 g/dL — ABNORMAL LOW (ref 6.5–8.1)

## 2020-05-01 LAB — MAGNESIUM: Magnesium: 1.5 mg/dL — ABNORMAL LOW (ref 1.7–2.4)

## 2020-05-01 LAB — TSH: TSH: 1.911 u[IU]/mL (ref 0.350–4.500)

## 2020-05-01 MED ORDER — SODIUM CHLORIDE 0.9% FLUSH
10.0000 mL | INTRAVENOUS | Status: DC | PRN
  Administered 2020-05-01: 10 mL

## 2020-05-01 MED ORDER — OXYCODONE HCL 5 MG PO TABS: 10.0000 mg | ORAL_TABLET | Freq: Four times a day (QID) | ORAL | 0 refills | Status: AC | PRN

## 2020-05-01 MED ORDER — MAGNESIUM OXIDE 400 (241.3 MG) MG PO TABS: 400.0000 mg | ORAL_TABLET | Freq: Every day | ORAL | 1 refills | Status: DC

## 2020-05-01 MED ORDER — MAGNESIUM SULFATE 2 GM/50ML IV SOLN
2.0000 g | Freq: Once | INTRAVENOUS | Status: AC
  Administered 2020-05-01: 2 g via INTRAVENOUS
  Filled 2020-05-01: qty 50

## 2020-05-01 MED ORDER — HEPARIN SOD (PORK) LOCK FLUSH 100 UNIT/ML IV SOLN
500.0000 [IU] | Freq: Once | INTRAVENOUS | Status: AC | PRN
  Administered 2020-05-01: 500 [IU]

## 2020-05-01 MED ORDER — SODIUM CHLORIDE 0.9 % IV SOLN: Freq: Once | INTRAVENOUS | Status: AC

## 2020-05-01 NOTE — Patient Instructions (Signed)
[facility] Discharge Instructions for Patients Receiving Chemotherapy  Today you received the following chemotherapy agents   To help prevent nausea and vomiting after your treatment, we encourage you to take your nausea medication   If you develop nausea and vomiting that is not controlled by your nausea medication, call the clinic.   BELOW ARE SYMPTOMS THAT SHOULD BE REPORTED IMMEDIATELY:  *FEVER GREATER THAN 100.5 F  *CHILLS WITH OR WITHOUT FEVER  NAUSEA AND VOMITING THAT IS NOT CONTROLLED WITH YOUR NAUSEA MEDICATION  *UNUSUAL SHORTNESS OF BREATH  *UNUSUAL BRUISING OR BLEEDING  TENDERNESS IN MOUTH AND THROAT WITH OR WITHOUT PRESENCE OF ULCERS  *URINARY PROBLEMS  *BOWEL PROBLEMS  UNUSUAL RASH Items with * indicate a potential emergency and should be followed up as soon as possible.  Feel free to call the clinic should you have any questions or concerns. The clinic phone number is [phone].  Please show the CHEMO ALERT CARD at check-in to the Emergency Department and triage nurse.   

## 2020-05-01 NOTE — Patient Instructions (Signed)
[place] [facility] Discharge Instructions  You were seen today by Dr. Delton Coombes. He went over your recent results. You did not receive your treatment today due to low platelet count and low energy levels. You will be prescribed magnesium to take once daily. Dr. Delton Coombes will see you back in 1 week for labs and follow up.   Thank you for choosing [facility] to provide your oncology and hematology care.  To afford each patient quality time with our provider, please arrive at least 15 minutes before your scheduled appointment time.   If you have a lab appointment with the [facility] please come in thru the Main Entrance and check in at the main information desk  You need to re-schedule your appointment should you arrive 10 or more minutes late.  We strive to give you quality time with our providers, and arriving late affects you and other patients whose appointments are after yours.  Also, if you no show three or more times for appointments you may be dismissed from the clinic at the providers discretion.     Again, thank you for choosing [facility].  Our hope is that these requests will decrease the amount of time that you wait before being seen by our physicians.       _____________________________________________________________  Should you have questions after your visit to [facility], please contact our office at [phone] between the hours of 8:00 a.m. and 4:30 p.m.  Voicemails left after 4:00 p.m. will not be returned until the following business day.  For prescription refill requests, have your pharmacy contact our office and allow 72 hours.    Cancer Center Support Programs:   > Cancer Support Group  2nd Tuesday of the month 1pm-2pm, Journey Room

## 2020-05-01 NOTE — Progress Notes (Signed)
Pt here for carbo/pem/pem.  Pt feels a little more short of breath since the last treatment. Platelets 47, treatment being held. Will return in one week to reevaluate. Will receive 2g of magnesium for magnesium 1.5.  Tolerated treatment well today without incidence.  Discharged via wheelchair in stable condition.

## 2020-05-01 NOTE — Progress Notes (Signed)
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  PRIOR THERAPY: SRS on 01/31/2020  NGS Results: PD-L1 TPS 1%, Foundation 1 MS--stable, TMB 10 Muts/Mb  CURRENT THERAPY: Carboplatin, pemetrexed & Keytruda every 3 weeks  BRIEF ONCOLOGIC HISTORY:  Oncology History  Malignant neoplasm of lung ([place])  12/20/2019 Initial Diagnosis   Adenocarcinoma of lung, stage 4, right ([place])   12/20/2019 Cancer Staging   Staging form: Lung, AJCC 8th Edition - Clinical: Stage IVB (cT1b, cN2, pM1c) - Signed by [name], MD on 12/20/2019   12/25/2019 Genetic Testing   PDL1     12/31/2019 [facility]     01/17/2020 -  Chemotherapy    Patient is on Treatment Plan: LUNG CARBOPLATIN / PEMETREXED / PEMBROLIZUMAB Q21D INDUCTION X 4 CYCLES / MAINTENANCE PEMETREXED + PEMBROLIZUMAB        CANCER STAGING: Cancer Staging Malignant neoplasm of lung ([place]) Staging form: Lung, AJCC 8th Edition - Clinical: Stage IVB (cT1b, cN2, pM1c) - Signed by [name], MD on 12/20/2019   INTERVAL HISTORY:  [name], a 75 y.o. male, returns for routine follow-up and consideration for next cycle of chemotherapy. [name] was last seen on 04/10/2020.  Due for cycle #6 of carboplatin, pemetrexed and Keytruda today.   Today he is accompanied by his wife. Overall, he tells me he has been feeling okay. Several days ago he started feeling fatigued, SOB with exertion and weakness whenever he walks even to the bathroom. He denies SOB or weakness when he sits. He also reports having easy bruising on his forearms. He tolerated the previous treatment well and denies having N/V/D, which is controlled with Carafate and Reglan. He denies having nosebleeds, hematuria or  hematochezia. He continues having body aches and pains and has to take oxycodone 10 mg every 6 hours. His appetite is excellent.  He will forego treatment to improve his low platelet count and energy levels.    REVIEW OF SYSTEMS:  Review of Systems  Constitutional: Positive for fatigue (75%; easy fatiguability). Negative for appetite change.  HENT:   Negative for nosebleeds.   Respiratory: Positive for shortness of breath (w/ exertion for last several days).   Gastrointestinal: Negative for blood in stool, diarrhea, nausea and vomiting.  Genitourinary: Negative for hematuria.   Neurological: Positive for extremity weakness (for last several days).  Hematological: Bruises/bleeds easily (bruising on forearms).  All other systems reviewed and are negative.   PAST MEDICAL/SURGICAL HISTORY:  Past Medical History:  Diagnosis Date  . Arthritis   . Chronic low back pain    truck driver  . GERD (gastroesophageal reflux disease)   . Hyperlipidemia   . Lung cancer ([place])    stage IV non small cell lung ca  . Nocturia   . Port-A-Cath in place 01/10/2020  . Prostate cancer ([place]) UROLOGIST-  DR WRENN/  ONCOLOGIST-  DR MANNING   dx 02/ 2017via TRUSPbx---  Stage T1c,  Gleason 3+3,  PSA 11.9  . Wears glasses   . Wears partial dentures    upper and lower   Past Surgical History:  Procedure Laterality Date  . CATARACT EXTRACTION W/ INTRAOCULAR LENS  IMPLANT, BILATERAL  2015  . CHOLECYSTECTOMY    . CYSTOSCOPY  09/30/2016   Procedure: CYSTOSCOPY;  Surgeon: Irine Seal, MD;  Location: [facility];  Service: Urology;;  no seeds found in bladder  . PORTACATH PLACEMENT Left 01/14/2020   Procedure: INSERTION PORT-A-CATH;  Surgeon: Aviva Signs, MD;  Location: [facility];  Service: General;  Laterality: Left;  . RADIOACTIVE SEED IMPLANT N/A 09/30/2016   Procedure: RADIOACTIVE SEED IMPLANT/BRACHYTHERAPY IMPLANT, SPACE OAR;  Surgeon: Irine Seal, MD;  Location: [facility];   Service: Urology;  Laterality: N/A;  70 seeds implanted  . SPERMATOCELECTOMY Left 01/30/2015   Procedure: SPERMATOCELECTOMY;  Surgeon: Irine Seal, MD;  Location: [facility];  Service: Urology;  Laterality: Left;    SOCIAL HISTORY:  Social History   Socioeconomic History  . Marital status: Married    Spouse name: Not on file  . Number of children: 2  . Years of education: Not on file  . Highest education level: Not on file  Occupational History  . Occupation: truck Geophysicist/field seismologist  Tobacco Use  . Smoking status: Former Smoker    Packs/day: 2.00    Years: 33.00    Pack years: 66.00    Types: Cigarettes    Quit date: 01/23/1991    Years since quitting: 29.2  . Smokeless tobacco: Never Used  Vaping Use  . Vaping Use: Never used  Substance and Sexual Activity  . Alcohol use: No  . Drug use: No  . Sexual activity: Yes  Other Topics Concern  . Not on file  Social History Narrative  . Not on file   Social Determinants of Health   Financial Resource Strain: Low Risk   . Difficulty of Paying Living Expenses: Not hard at all  Food Insecurity: No Food Insecurity  . Worried About Charity fundraiser in the Last Year: Never true  . Ran Out of Food in the Last Year: Never true  Transportation Needs: No Transportation Needs  . Lack of Transportation (Medical): No  . Lack of Transportation (Non-Medical): No  Physical Activity: Sufficiently Active  . Days of Exercise per Week: 1 day  . Minutes of Exercise per Session: 150+ min  Stress: No Stress Concern Present  . Feeling of Stress : Only a little  Social Connections: Socially Integrated  . Frequency of Communication with Friends and Family: More than three times a week  . Frequency of Social Gatherings with Friends and Family: Three times a week  . Attends Religious Services: More than 4 times per year  . Active Member of Clubs or Organizations: Yes  . Attends Archivist Meetings: More than 4 times per year  .  Marital Status: Married  Human resources officer Violence: Not At Risk  . Fear of Current or Ex-Partner: No  . Emotionally Abused: No  . Physically Abused: No  . Sexually Abused: No    FAMILY HISTORY:  Family History  Problem Relation Age of Onset  . Diabetes Mother   . Stroke Mother   . Throat cancer Mother   . Skin cancer Sister   . Prostate cancer Brother   . Cancer Paternal Grandmother   . Clotting disorder Daughter     CURRENT MEDICATIONS:  Current Outpatient Medications  Medication Sig Dispense Refill  . aspirin EC 81 MG tablet Take 81 mg by mouth daily.     Marland Kitchen atorvastatin (LIPITOR) 40 MG tablet Take 40 mg by mouth daily.    Marland Kitchen CARBOPLATIN IV Inject into the vein every 21 ( twenty-one) days.    Marland Kitchen dexamethasone (DECADRON) 2 MG tablet Take  1 tablet (2 mg total) by mouth 2 (two) times daily. 60 tablet 1  . diltiazem (CARDIZEM CD) 120 MG 24 hr capsule Take 1 capsule (120 mg total) by mouth daily. 30 capsule 11  . docusate sodium (COLACE) 100 MG capsule Take 200 mg by mouth daily.    . folic acid (FOLVITE) 1 MG tablet Take 1 tablet (1 mg total) by mouth daily. 90 tablet 1  . KLOR-CON M20 20 MEQ tablet TAKE 1 TABLET BY MOUTH EVERY DAY 30 tablet 1  . lidocaine-prilocaine (EMLA) cream Apply a small amount to port a cath site and cover with plastic wrap 1 hour prior to chemotherapy appointments 30 g 3  . magnesium oxide (MAG-OX) 400 (241.3 Mg) MG tablet Take 1 tablet (400 mg total) by mouth daily. 30 tablet 1  . metoCLOPramide (REGLAN) 10 MG tablet Take 1 tablet (10 mg total) by mouth every 8 (eight) hours. 90 tablet 1  . omeprazole (PRILOSEC) 40 MG capsule Take 40 mg by mouth daily.    Marland Kitchen PEMEtrexed 500 mg/m2 in sodium chloride 0.9 % 100 mL Inject 500 mg/m2 into the vein every 21 ( twenty-one) days.    . sodium chloride 0.9 % SOLN 50 mL with pembrolizumab 100 MG/4ML SOLN 2 mg/kg Inject 200 mg into the vein every 21 ( twenty-one) days.    . sucralfate (CARAFATE) 1 g tablet Take 1 tablet (1 g  total) by mouth 4 (four) times daily -  with meals and at bedtime. 60 tablet 1  . Lactulose 20 GM/30ML SOLN Take 30 ml by mouth every 3 hours until bowel movement is had; then continue taking 30 ml by mouth once daily (Patient not taking: Reported on 05/01/2020) 450 mL 2  . oxyCODONE (OXY IR/ROXICODONE) 5 MG immediate release tablet Take 2 tablets (10 mg total) by mouth every 6 (six) hours as needed for severe pain. 240 tablet 0  . promethazine (PHENERGAN) 25 MG suppository Place 1 suppository (25 mg total) rectally every 6 (six) hours as needed for nausea or vomiting. (Patient not taking: Reported on 05/01/2020) 12 each 0   Current Facility-Administered Medications  Medication Dose Route Frequency Provider Last Rate Last Admin  . magnesium sulfate IVPB 2 g 50 mL  2 g Intravenous Once [name], MD 50 mL/hr at 05/01/20 1004 2 g at 05/01/20 1004   Facility-Administered Medications Ordered in Other Visits  Medication Dose Route Frequency Provider Last Rate Last Admin  . heparin lock flush 100 unit/mL  500 Units Intracatheter Once PRN [name], MD      . sodium chloride flush (NS) 0.9 % injection 10 mL  10 mL Intracatheter PRN [name], MD        ALLERGIES:  No Known Allergies  PHYSICAL EXAM:  Performance status (ECOG): 1 - Symptomatic but completely ambulatory  Vitals:   05/01/20 0902  BP: 127/75  Pulse: 78  Resp: 18  Temp: (!) 96.8 F (36 C)  SpO2: 98%   Wt Readings from Last 3 Encounters:  05/01/20 210 lb 12.8 oz (95.6 kg)  04/10/20 208 lb 8.9 oz (94.6 kg)  03/28/20 198 lb (89.8 kg)   Physical Exam Vitals reviewed.  Constitutional:      Appearance: Normal appearance.  Cardiovascular:     Rate and Rhythm: Normal rate and regular rhythm.     Pulses: Normal pulses.     Heart sounds: Normal heart sounds.  Pulmonary:     Effort: Pulmonary effort is normal.  Breath sounds: Normal breath sounds.  Chest:     Comments: Port-a-Cath in L  chest Neurological:     General: No focal deficit present.     Mental Status: He is alert and oriented to person, place, and time.  Psychiatric:        Mood and Affect: Mood normal.        Behavior: Behavior normal.     LABORATORY DATA:  I have reviewed the labs as listed.  CBC Latest Ref Rng & Units 05/01/2020 04/10/2020 03/20/2020  WBC 4.0 - 10.5 K/uL 4.4 5.7 8.1  Hemoglobin 13.0 - 17.0 g/dL 9.1(L) 10.2(L) 10.8(L)  Hematocrit 39.0 - 52.0 % 28.3(L) 31.9(L) 33.4(L)  Platelets 150 - 400 K/uL 47(L) 100(L) 165   CMP Latest Ref Rng & Units 05/01/2020 04/10/2020 03/20/2020  Glucose 70 - 99 mg/dL 120(H) 117(H) 105(H)  BUN 8 - 23 mg/dL '14 15 15  ' Creatinine 0.61 - 1.24 mg/dL 0.79 0.78 0.91  Sodium 135 - 145 mmol/L 133(L) 134(L) 135  Potassium 3.5 - 5.1 mmol/L 3.8 4.5 3.6  Chloride 98 - 111 mmol/L 98 97(L) 104  CO2 22 - 32 mmol/L '26 26 23  ' Calcium 8.9 - 10.3 mg/dL 8.3(L) 8.8(L) 7.7(L)  Total Protein 6.5 - 8.1 g/dL 6.1(L) 6.6 5.6(L)  Total Bilirubin 0.3 - 1.2 mg/dL 0.3 0.6 0.5  Alkaline Phos 38 - 126 U/L 85 79 67  AST 15 - 41 U/L '28 24 17  ' ALT 0 - 44 U/L '30 29 24    ' DIAGNOSTIC IMAGING:  I have independently reviewed the scans and discussed with the patient. No results found.   ASSESSMENT:  1. 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 Jenetta Downer. -CT abdomen on 12/03/2019 showed posterior right lobe lesion measuring 2.1 x 1.9 cm and another anterior liver dome lesion measuring 1.3 x 1.2 cm. Right adrenal mass measuring 3.3 x 1.8 cm. -MRI of the liver on 12/05/2019 showed rim-enhancing lesion of the right adrenal gland measuring 3.0 x 2.5 cm. There is rim-enhancing lesion of the superior pole of the left kidney measuring 1.9 x 1.7 cm. Right lobe of the liver lesion measuring 2.2 x 2.0 cm. Anterior liver dome lesion measuring 1.1 x 0.9 cm. -CT chest without contrast on 12/07/2019 showed 1.9 cm spiculated nodule  in the right lung apex. Right paratracheal lymph node 1.3 cm. 1.5 cm lower right paratracheal node. -Right lobe of liver needle biopsy consistent with adenocarcinoma, positive for CK7, TTF-1. Negative for CK20, CDX2, Napsin a and CK 5/6. -PD-L1 TPS 1% -Foundation 1 with TMB high, MS-stable, no other targetable mutations. -PET scan on 01/21/2020 showed 2 cm nodule in the right lung apex, thoracic nodal metastasis. 2.5 cm hepatic meta stasis. Additional small hepatic lesions incompletely characterized. Bilateral adrenal meta stasis. Multifocal bone metastasis throughout the axial and appendicular skeleton. -Cycle 1 of carboplatin, pemetrexed and pembrolizumab started on 01/17/2020. -PET scan on 03/17/2020 showing interval partial metabolic response with no new or progressive hypermetabolic metastatic disease.  2. Prostate cancer: -Diagnosed in 2017, seed implants done in June 2018.  3. Social/family history: -He drives a Teacher, English as a foreign language. Quit smoking in 1992, 2 packs/day for 32 years. -Brother had prostate cancer. Sister had skin cancer and mother had throat cancer.  4. Brain metastasis: -MRI of the brain on 12/28/2019 shows bilateral cerebellar metastasis measuring 6 to 7 mm. Enhancing 1.2 cm left clival metastasis. -SRS to the brain lesion on 01/25/2020.  PLAN:  1. 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 - Wife also reported easy bruising. - Reviewed his labs today.  LFTs are normal. - Platelet count is 47.  We will hold chemotherapy today.  We will reevaluate next week.  2. Brain metastasis: -He has repeat MRI scheduled later today.  3. Generalized pains: -Continue oxycodone 10 mg every 6 hours as needed.  He tried to take it every 8 hours which did not help.  4. Uncontrolled nausea/vomiting: -Continue Reglan 10 mg every 8 hours and Carafate every 4 hours. - Nausea is under control.  5.   Hypomagnesemia: - He will receive magnesium 2 g today.  We will start him on magnesium 400 mg daily.   Orders placed this encounter:  No orders of the defined types were placed in this encounter.    [name], MD [place] [phone]   I, Milinda Antis, am acting as a scribe for Dr. Sanda Linger.  I, [name] MD, have reviewed the above documentation for accuracy and completeness, and I agree with the above.

## 2020-05-01 NOTE — Progress Notes (Signed)
Patient assessed and labs reviewed by Dr. Delton Coombes. Magnesium 2 gram IV ordered by Dr. Raliegh Ip due to [facility] being 1.5 today. No treatment today. He will come back in a week for labs and possible treatment then. Primary RN and pharmacy aware.

## 2020-05-02 ENCOUNTER — Ambulatory Visit
Admission: RE | Admit: 2020-05-02 | Discharge: 2020-05-02 | Disposition: A | Discharge status: Home / Self Care | Payer: Medicare Other | Source: Ambulatory Visit | Attending: Radiation Oncology | Admitting: Radiation Oncology

## 2020-05-02 DIAGNOSIS — C7949 Secondary malignant neoplasm of other parts of nervous system: Secondary | ICD-10-CM

## 2020-05-02 MED ORDER — GADOBENATE DIMEGLUMINE 529 MG/ML IV SOLN
20.0000 mL | Freq: Once | INTRAVENOUS | Status: AC | PRN
  Administered 2020-05-02: 20 mL via INTRAVENOUS

## 2020-05-02 MED ORDER — SODIUM CHLORIDE 0.9% FLUSH
10.0000 mL | INTRAVENOUS | Status: DC | PRN
  Administered 2020-05-02: 10 mL via INTRAVENOUS

## 2020-05-02 MED ORDER — HEPARIN SOD (PORK) LOCK FLUSH 100 UNIT/ML IV SOLN
500.0000 [IU] | Freq: Once | INTRAVENOUS | Status: AC
  Administered 2020-05-02: 500 [IU] via INTRAVENOUS

## 2020-05-02 MED FILL — Dexamethasone Sodium Phosphate Inj 100 MG/10ML: INTRAMUSCULAR | Qty: 1 | Status: AC

## 2020-05-02 NOTE — Progress Notes (Signed)

## 2020-05-05 ENCOUNTER — Encounter: Payer: Self-pay | Admitting: Urology

## 2020-05-05 ENCOUNTER — Telehealth: Payer: Self-pay

## 2020-05-05 ENCOUNTER — Other Ambulatory Visit: Payer: Self-pay

## 2020-05-05 NOTE — Telephone Encounter (Signed)
Spoke with patient in regards to telephone follow-up appointment with Freeman Caldron PA on 05/08/20 @ 1:00pm. Patient verbalized understanding of appointment date and time. Reviewed meaningful use questions. TM

## 2020-05-05 NOTE — Telephone Encounter (Signed)
Left patient a voicemail message in regards to telephone visit with Freeman Caldron PA on 05/08/20 @ 9:30am. Called to review meaningful use questions. TM

## 2020-05-07 MED FILL — Dexamethasone Sodium Phosphate Inj 100 MG/10ML: INTRAMUSCULAR | Qty: 1 | Status: AC

## 2020-05-08 ENCOUNTER — Other Ambulatory Visit: Payer: Self-pay

## 2020-05-08 ENCOUNTER — Inpatient Hospital Stay (HOSPITAL_COMMUNITY): Payer: Medicare Other

## 2020-05-08 ENCOUNTER — Ambulatory Visit
Admission: RE | Admit: 2020-05-08 | Discharge: 2020-05-08 | Disposition: A | Discharge status: Home / Self Care | Payer: Medicare Other | Source: Ambulatory Visit | Attending: Urology | Admitting: Urology

## 2020-05-08 ENCOUNTER — Inpatient Hospital Stay (HOSPITAL_BASED_OUTPATIENT_CLINIC_OR_DEPARTMENT_OTHER): Payer: Medicare Other | Admitting: Hematology

## 2020-05-08 VITALS — BP 109/75 | HR 91 | Temp 97.3°F | Resp 20 | Wt 207.0 lb

## 2020-05-08 DIAGNOSIS — C3491 Malignant neoplasm of unspecified part of right bronchus or lung: Secondary | ICD-10-CM | POA: Diagnosis not present

## 2020-05-08 DIAGNOSIS — C787 Secondary malignant neoplasm of liver and intrahepatic bile duct: Secondary | ICD-10-CM | POA: Diagnosis not present

## 2020-05-08 DIAGNOSIS — C7931 Secondary malignant neoplasm of brain: Secondary | ICD-10-CM

## 2020-05-08 LAB — CBC WITH DIFFERENTIAL/PLATELET
Abs Immature Granulocytes: 0.37 10*3/uL — ABNORMAL HIGH (ref 0.00–0.07)
Basophils Absolute: 0.1 10*3/uL (ref 0.0–0.1)
Basophils Relative: 1 %
Eosinophils Absolute: 0 10*3/uL (ref 0.0–0.5)
Eosinophils Relative: 0 %
HCT: 33.9 % — ABNORMAL LOW (ref 39.0–52.0)
Hemoglobin: 10.9 g/dL — ABNORMAL LOW (ref 13.0–17.0)
Immature Granulocytes: 5 %
Lymphocytes Relative: 20 %
Lymphs Abs: 1.6 10*3/uL (ref 0.7–4.0)
MCH: 35.4 pg — ABNORMAL HIGH (ref 26.0–34.0)
MCHC: 32.2 g/dL (ref 30.0–36.0)
MCV: 110.1 fL — ABNORMAL HIGH (ref 80.0–100.0)
Monocytes Absolute: 1 10*3/uL (ref 0.1–1.0)
Monocytes Relative: 12 %
Neutro Abs: 4.9 10*3/uL (ref 1.7–7.7)
Neutrophils Relative %: 62 %
Platelets: 83 10*3/uL — ABNORMAL LOW (ref 150–400)
RBC: 3.08 MIL/uL — ABNORMAL LOW (ref 4.22–5.81)
RDW: 21.7 % — ABNORMAL HIGH (ref 11.5–15.5)
WBC: 7.8 10*3/uL (ref 4.0–10.5)
nRBC: 3.6 % — ABNORMAL HIGH (ref 0.0–0.2)

## 2020-05-08 LAB — COMPREHENSIVE METABOLIC PANEL
ALT: 24 U/L (ref 0–44)
AST: 29 U/L (ref 15–41)
Albumin: 2.9 g/dL — ABNORMAL LOW (ref 3.5–5.0)
Alkaline Phosphatase: 119 U/L (ref 38–126)
Anion gap: 11 (ref 5–15)
BUN: 16 mg/dL (ref 8–23)
CO2: 25 mmol/L (ref 22–32)
Calcium: 8.4 mg/dL — ABNORMAL LOW (ref 8.9–10.3)
Chloride: 96 mmol/L — ABNORMAL LOW (ref 98–111)
Creatinine, Ser: 0.98 mg/dL (ref 0.61–1.24)
GFR, Estimated: >60 mL/min (ref >60)
Glucose, Bld: 112 mg/dL — ABNORMAL HIGH (ref 70–99)
Potassium: 4.2 mmol/L (ref 3.5–5.1)
Sodium: 132 mmol/L — ABNORMAL LOW (ref 135–145)
Total Bilirubin: 0.6 mg/dL (ref 0.3–1.2)
Total Protein: 6.6 g/dL (ref 6.5–8.1)

## 2020-05-08 LAB — MAGNESIUM: Magnesium: 1.6 mg/dL — ABNORMAL LOW (ref 1.7–2.4)

## 2020-05-08 MED ORDER — MISC. DEVICES MISC: 0 refills | Status: AC

## 2020-05-08 MED ORDER — SODIUM CHLORIDE 0.9% FLUSH
10.0000 mL | Freq: Once | INTRAVENOUS | Status: AC
  Administered 2020-05-08: 10 mL via INTRAVENOUS

## 2020-05-08 MED ORDER — HEPARIN SOD (PORK) LOCK FLUSH 100 UNIT/ML IV SOLN
500.0000 [IU] | Freq: Once | INTRAVENOUS | Status: AC
  Administered 2020-05-08: 500 [IU] via INTRAVENOUS

## 2020-05-08 MED ORDER — MAGNESIUM OXIDE 400 MG PO TABS: 1.0000 | ORAL_TABLET | Freq: Two times a day (BID) | ORAL | 3 refills | Status: DC

## 2020-05-08 NOTE — Progress Notes (Signed)
Radiation Oncology         [phone] ________________________________  Name: Lance Stafford MRN: [id number]  Date: 05/08/2020  DOB: 11/15/1945  Post Treatment Note  CC: [name], NP  [name], MD  Diagnosis:   75 y.o. male with multiple subcentimeter brain metastases from adenocarcinoma of the right upper lung - Stage IV  Interval Since Last Radiation:  3 months  01/31/20:  SRS// 20 Gy in one fraction to the six brain metastasis targets using a single isocenter: PTV1 Rt Cerebellum 62mm PTV2 Lt Cerebellum 81mm PTV3 Lt Temporal 55mm PTV4 Lt Cerebellum 38mm PTV5 Rt Cerebellum 56mm PTV6 Rt Frontal 33mm  Narrative:  I spoke with the patient to conduct his routine scheduled 3 month follow up visit via telephone to spare the patient unnecessary potential exposure in the healthcare setting during the current COVID-19 pandemic.  The patient was notified in advance and gave permission to proceed with this visit format.  He tolerated the [facility] treatment well without any negative side effects and remains without complaints aside from generalized weakness and fatigue. He denies recent fevers, chills, N/V/D or night sweats. He has not had significant headaches and denies dizziness/imbalance, focal weakness, or changes in auditory or visual acuity. He has chronic shortness of breath with exertion but unchanged recently.  He continues to tolerate his systemic therapy pretty well and will have cycle 6 of carboplatin, pemetrexed and Keytruda today, under the care and direction of Dr. Delton Coombes.   His most recent restaging PET scan on 03/17/2020 showed interval partial metabolic response with no new or progressive hypermetabolic metastatic disease.  His first post-treatment MRI brain scan from 05/02/20 shows stable-improved disease in the previously treated lesions. There are three new punctate foci of enhancement in the right frontal lobe which may represent new tiny metastases versus  vessels. His imaging was reviewed at the recent multidisciplinary brain tumor board on 05/05/20 and these were felt most likely to represent small vessles but the consensus recommendation is to closely monitor with a repeat MRI brain scan in 2 months.             On review of systems, the patient states that he is doing fairly well in general.  He denies any neurologic issues or ill side effects from his [facility] treatment.  He has continued on systemic chemotherapy with Carboplatin/Pemetrexed/Keytruda, under the care and direction of Dr. Delton Coombes.  Unfortunately, he has had several bouts of intractable nausea and vomiting following his chemo treatments, requiring evaluation in the emergency department for fluid resuscitation, most recently on 03/05/2020.  However, since his last ER visit, he has had much improved appetite and energy and has not had further N/V which he is quite pleased with. He specifically denies headaches, changes in visual or auditory acuity, dizziness/imbalance, focal weakness in the upper or lower extremities, tenderness or seizure activity.  ALLERGIES:  has No Known Allergies.  Meds: Current Outpatient Medications  Medication Sig Dispense Refill  . aspirin EC 81 MG tablet Take 81 mg by mouth daily.     Marland Kitchen atorvastatin (LIPITOR) 40 MG tablet Take 40 mg by mouth daily.    Marland Kitchen CARBOPLATIN IV Inject into the vein every 21 ( twenty-one) days.    Marland Kitchen dexamethasone (DECADRON) 2 MG tablet Take 1 tablet (2 mg total) by mouth 2 (two) times daily. 60 tablet 1  . diltiazem (CARDIZEM CD) 120 MG 24 hr capsule Take 1 capsule (120 mg total) by mouth daily. 30 capsule 11  .  docusate sodium (COLACE) 100 MG capsule Take 200 mg by mouth daily.    . folic acid (FOLVITE) 1 MG tablet Take 1 tablet (1 mg total) by mouth daily. 90 tablet 1  . KLOR-CON M20 20 MEQ tablet TAKE 1 TABLET BY MOUTH EVERY DAY 30 tablet 1  . lidocaine-prilocaine (EMLA) cream Apply a small amount to port a cath site and cover with  plastic wrap 1 hour prior to chemotherapy appointments 30 g 3  . metoCLOPramide (REGLAN) 10 MG tablet Take 1 tablet (10 mg total) by mouth every 8 (eight) hours. 90 tablet 1  . omeprazole (PRILOSEC) 40 MG capsule Take 40 mg by mouth daily.    Marland Kitchen oxyCODONE (OXY IR/ROXICODONE) 5 MG immediate release tablet Take 2 tablets (10 mg total) by mouth every 6 (six) hours as needed for severe pain. 240 tablet 0  . PEMEtrexed 500 mg/m2 in sodium chloride 0.9 % 100 mL Inject 500 mg/m2 into the vein every 21 ( twenty-one) days.    . promethazine (PHENERGAN) 25 MG suppository Place 1 suppository (25 mg total) rectally every 6 (six) hours as needed for nausea or vomiting. 12 each 0  . sodium chloride 0.9 % SOLN 50 mL with pembrolizumab 100 MG/4ML SOLN 2 mg/kg Inject 200 mg into the vein every 21 ( twenty-one) days.    . sucralfate (CARAFATE) 1 g tablet Take 1 tablet (1 g total) by mouth 4 (four) times daily -  with meals and at bedtime. 60 tablet 1  . Lactulose 20 GM/30ML SOLN Take 30 ml by mouth every 3 hours until bowel movement is had; then continue taking 30 ml by mouth once daily 450 mL 2  . magnesium oxide (MAG-OX) 400 MG tablet Take 1 tablet by mouth daily.     No current facility-administered medications for this encounter.    Physical Findings:  vitals were not taken for this visit.   /Unable to assess due to telephone follow-up visit format.  Lab Findings: Lab Results  Component Value Date   WBC 4.4 05/01/2020   HGB 9.1 (L) 05/01/2020   HCT 28.3 (L) 05/01/2020   MCV 109.7 (H) 05/01/2020   PLT 47 (L) 05/01/2020     Radiographic Findings: MR Brain W Wo Contrast  Result Date: 05/02/2020 CLINICAL DATA:  Brain/CNS neoplasm, surveillance. Follow-up treated metastatic disease. EXAM: MRI HEAD WITHOUT AND WITH CONTRAST TECHNIQUE: Multiplanar, multiecho pulse sequences of the brain and surrounding structures were obtained without and with intravenous contrast. CONTRAST:  58mL MULTIHANCE GADOBENATE  DIMEGLUMINE 529 MG/ML IV SOLN COMPARISON:  MRI 01/24/2020. FINDINGS: Brain: No acute infarct. No mass abnormal mass effect or midline shift. No acute hemorrhage. No extra-axial fluid collection. No hydrocephalus. Possible new lesions: *There is a punctate focus of enhancement in the right frontal cortex (see series 11, image 136) which appears to be in a separate location than the focus of enhancement seen on the prior. *There is an additional punctate focus of enhancement in the inferior aspect of the right frontal lobe (series 11, image 102). *New punctate focus of enhancement in the posterior high right frontal lobe (series 11, image 151). Improved lesions: *The previously noted minimally enhancing medial left cerebellar lesion is no longer visualized. *The inferior right cerebellar lesion has decreased in size, now measuring 5 mm on series 13, image 56 (previously 7 mm). *The previously seen other punctate cerebellar foci of enhancement are no longer visualized. *The previously seen punctate focus of enhancement in the right frontal lobe is not confidently  visualized. Similar lesions: *Punctate focus of enhancement in the posterolateral left temporal cortex (series 11, image 100) is similar to prior. Likely vascular: Two small areas of enhancement in the right basal ganglia (series 11, image 117) are favored to be vascular given branching/linear appearance. Vascular: Major arterial flow voids are maintained at the skull base. Skull and upper cervical spine: Enhancing 1.3 cm left clival lesion appears slightly decreased in conspicuity. Sinuses/Orbits: Mild paranasal sinus mucosal thickening without air-fluid levels. Unremarkable orbits. Other: No mastoid effusions. IMPRESSION: 1. Three new punctate foci of enhancement in the right frontal lobe (as detailed above), which may represent new tiny metastases versus vessels. 2. Otherwise, multiple enhancing lesions described on prior are either smaller/less visible or  stable, as detailed above. A punctate focus of enhancement in the lateral aspect of the posterolateral left temporal lobe was not described on the prior but appears similar. 3. Enhancing 1.3 cm left clival lesion appears slightly decreased in conspicuity. Electronically Signed   By: Margaretha Sheffield MD   On: 05/02/2020 13:03    Impression/Plan: 97. 75 y.o. 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 He has recovered well from the effects of his recent SRS brain treatment and is currently without complaints.  His first post-treatment MRI brain scan from 05/02/20 shows stable-improved disease in the previously treated lesions. There are three new punctate foci of enhancement in the right frontal lobe which may represent new tiny metastases versus vessels. His imaging was reviewed at the recent multidisciplinary brain tumor board on 05/05/20 and these were felt most likely to represent small vessles but the consensus recommendation is to closely monitor with a repeat MRI brain scan in 2 months.  We will plan to follow-up by phone thereafter to review results and pending that scan is stable, we will continue with serial MRI brain scans every 3 months for surveillance with a telephone follow up thereafter to review results. He will also continue in routine follow-up under the care and direction of Dr. Delton Coombes.  He knows that he is welcome to call with any questions or concerns related to his radiotherapy in the interim.    Nicholos Johns, PA-C

## 2020-05-08 NOTE — Progress Notes (Signed)
Patients port flushed without difficulty.  Good blood return noted with no bruising or swelling noted at site.  Band aid applied.  VSS with discharge and left in satisfactory condition with no s/s of distress noted.   

## 2020-05-08 NOTE — Patient Instructions (Addendum)
[place] at [facility] Discharge Instructions  You were seen today by Dr. Delton Coombes. He went over your recent results. Due to your low platelets and weakness, you did not receive your treatment. Continue taking magnesium two times daily. The prescription for the rollator will be sent. Dr. Delton Coombes will see you back in 1 week for labs and follow up.   Thank you for choosing [place] at [facility] to provide your oncology and hematology care.  To afford each patient quality time with our provider, please arrive at least 15 minutes before your scheduled appointment time.   If you have a lab appointment with the [place] please come in thru the Main Entrance and check in at the main information desk  You need to re-schedule your appointment should you arrive 10 or more minutes late.  We strive to give you quality time with our providers, and arriving late affects you and other patients whose appointments are after yours.  Also, if you no show three or more times for appointments you may be dismissed from the clinic at the providers discretion.     Again, thank you for choosing [facility].  Our hope is that these requests will decrease the amount of time that you wait before being seen by our physicians.       _____________________________________________________________  Should you have questions after your visit to [facility], please contact our office at [phone] between the hours of 8:00 a.m. and 4:30 p.m.  Voicemails left after 4:00 p.m. will not be returned until the following business day.  For prescription refill requests, have your pharmacy contact our office and allow 72 hours.    Cancer Center Support Programs:   > Cancer Support Group  2nd Tuesday of the month 1pm-2pm, Journey Room

## 2020-05-08 NOTE — Progress Notes (Signed)
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  PRIOR THERAPY: SRS on 01/31/2020  NGS Results: PD-L1 TPS 1%, Foundation 1 MS--stable, TMB 10 Muts/Mb  CURRENT THERAPY: Carboplatin, pemetrexed & Keytruda every 3 weeks  BRIEF ONCOLOGIC HISTORY:  Oncology History  Malignant neoplasm of lung ([place])  12/20/2019 Initial Diagnosis   Adenocarcinoma of lung, stage 4, right ([place])   12/20/2019 Cancer Staging   Staging form: Lung, AJCC 8th Edition - Clinical: Stage IVB (cT1b, cN2, pM1c) - Signed by [name], MD on 12/20/2019   12/25/2019 Genetic Testing   PDL1     12/31/2019 [place] [facility]     01/17/2020 -  Chemotherapy    Patient is on Treatment Plan: LUNG CARBOPLATIN / PEMETREXED / PEMBROLIZUMAB Q21D INDUCTION X 4 CYCLES / MAINTENANCE PEMETREXED + PEMBROLIZUMAB        CANCER STAGING: Cancer Staging Malignant neoplasm of lung ([place]) Staging form: Lung, AJCC 8th Edition - Clinical: Stage IVB (cT1b, cN2, pM1c) - Signed by [name], MD on 12/20/2019   INTERVAL HISTORY:  Mr. Lealand Elting, a 75 y.o. male, returns for routine follow-up and consideration for next cycle of chemotherapy. Jacky was last seen on 05/01/2020.  Due for cycle #6 of carboplatin, pemetrexed and Keytruda today.   Today he is accompanied by his wife. Overall, he tells me he has been feeling poorly. He complains of having RUQ pain which radiates to the back; he notes that he developed a small pouch in his right posterior flank which has been slowly growing, but denies having pain. He continues having SOB with minimal activity and is often too weak to walk. He is taking oxycodone every 6 hours and denies having N/V/D.  He started taking magnesium. He is  requesting to have a rollator to help him ambulate due to his lower extremity weakness and a seat to sit down when he is tired.  He will forego chemo today due to low platelets and lower extremity weakness.    REVIEW OF SYSTEMS:  Review of Systems  Constitutional: Positive for fatigue (depleted). Negative for appetite change.  Respiratory: Positive for shortness of breath (w/ minimal activity).   Gastrointestinal: Positive for abdominal pain (RUQ pain radiating to back). Negative for diarrhea, nausea and vomiting.  Musculoskeletal: Positive for back pain (7/10 RUQ radiating to back) and gait problem (d/t lower extremity weakness).       Pouch in right lower back slowly growing  Neurological: Positive for extremity weakness (lower extremity) and gait problem (d/t lower extremity weakness).  All other systems reviewed and are negative.   PAST MEDICAL/SURGICAL HISTORY:  Past Medical History:  Diagnosis Date  . Arthritis   . Chronic low back pain    truck driver  . GERD (gastroesophageal reflux disease)   . Hyperlipidemia   . Lung cancer ([place])    stage IV non small cell lung ca  . Nocturia   . Port-A-Cath in place 01/10/2020  . Prostate cancer ([place]) UROLOGIST-  DR WRENN/  ONCOLOGIST-  DR MANNING   dx 02/ 2017via TRUSPbx---  Stage T1c,  Gleason 3+3,  PSA 11.9  . Wears glasses   . Wears partial dentures    upper and lower   Past Surgical History:  Procedure Laterality Date  .  CATARACT EXTRACTION W/ INTRAOCULAR LENS  IMPLANT, BILATERAL  2015  . CHOLECYSTECTOMY    . CYSTOSCOPY  09/30/2016   Procedure: CYSTOSCOPY;  Surgeon: Irine Seal, MD;  Location: [facility];  Service: Urology;;  no seeds found in bladder  . PORTACATH PLACEMENT Left 01/14/2020   Procedure: INSERTION PORT-A-CATH;  Surgeon: Aviva Signs, MD;  Location: [facility];  Service: General;  Laterality: Left;  . RADIOACTIVE SEED IMPLANT N/A 09/30/2016   Procedure: RADIOACTIVE SEED IMPLANT/BRACHYTHERAPY IMPLANT,  SPACE OAR;  Surgeon: Irine Seal, MD;  Location[facility];  Service: Urology;  Laterality: N/A;  70 seeds implanted  . SPERMATOCELECTOMY Left 01/30/2015   Procedure: SPERMATOCELECTOMY;  Surgeon: Irine Seal, MD;  Location[facility];  Service: Urology;  Laterality: Left;    SOCIAL HISTORY:  Social History   Socioeconomic History  . Marital status: Married    Spouse name: Not on file  . Number of children: 2  . Years of education: Not on file  . Highest education level: Not on file  Occupational History  . Occupation: truck Geophysicist/field seismologist  Tobacco Use  . Smoking status: Former Smoker    Packs/day: 2.00    Years: 33.00    Pack years: 66.00    Types: Cigarettes    Quit date: 01/23/1991    Years since quitting: 29.3  . Smokeless tobacco: Never Used  Vaping Use  . Vaping Use: Never used  Substance and Sexual Activity  . Alcohol use: No  . Drug use: No  . Sexual activity: Yes  Other Topics Concern  . Not on file  Social History Narrative  . Not on file   Social Determinants of Health   Financial Resource Strain: Low Risk   . Difficulty of Paying Living Expenses: Not hard at all  Food Insecurity: No Food Insecurity  . Worried About Charity fundraiser in the Last Year: Never true  . Ran Out of Food in the Last Year: Never true  Transportation Needs: No Transportation Needs  . Lack of Transportation (Medical): No  . Lack of Transportation (Non-Medical): No  Physical Activity: Sufficiently Active  . Days of Exercise per Week: 1 day  . Minutes of Exercise per Session: 150+ min  Stress: No Stress Concern Present  . Feeling of Stress : Only a little  Social Connections: Socially Integrated  . Frequency of Communication with Friends and Family: More than three times a week  . Frequency of Social Gatherings with Friends and Family: Three times a week  . Attends Religious Services: More than 4 times per year  . Active Member of Clubs or Organizations:  Yes  . Attends Archivist Meetings: More than 4 times per year  . Marital Status: Married  Human resources officer Violence: Not At Risk  . Fear of Current or Ex-Partner: No  . Emotionally Abused: No  . Physically Abused: No  . Sexually Abused: No    FAMILY HISTORY:  Family History  Problem Relation Age of Onset  . Diabetes Mother   . Stroke Mother   . Throat cancer Mother   . Skin cancer Sister   . Prostate cancer Brother   . Cancer Paternal Grandmother   . Clotting disorder Daughter     CURRENT MEDICATIONS:  Current Outpatient Medications  Medication Sig Dispense Refill  . aspirin EC 81 MG tablet Take 81 mg by mouth daily.     Marland Kitchen atorvastatin (LIPITOR) 40 MG tablet Take 40 mg by mouth daily.    Marland Kitchen  CARBOPLATIN IV Inject into the vein every 21 ( twenty-one) days.    Marland Kitchen dexamethasone (DECADRON) 2 MG tablet Take 1 tablet (2 mg total) by mouth 2 (two) times daily. 60 tablet 1  . diltiazem (CARDIZEM CD) 120 MG 24 hr capsule Take 1 capsule (120 mg total) by mouth daily. 30 capsule 11  . docusate sodium (COLACE) 100 MG capsule Take 200 mg by mouth daily.    . folic acid (FOLVITE) 1 MG tablet Take 1 tablet (1 mg total) by mouth daily. 90 tablet 1  . KLOR-CON M20 20 MEQ tablet TAKE 1 TABLET BY MOUTH EVERY DAY 30 tablet 1  . Lactulose 20 GM/30ML SOLN Take 30 ml by mouth every 3 hours until bowel movement is had; then continue taking 30 ml by mouth once daily 450 mL 2  . lidocaine-prilocaine (EMLA) cream Apply a small amount to port a cath site and cover with plastic wrap 1 hour prior to chemotherapy appointments 30 g 3  . metoCLOPramide (REGLAN) 10 MG tablet Take 1 tablet (10 mg total) by mouth every 8 (eight) hours. 90 tablet 1  . omeprazole (PRILOSEC) 40 MG capsule Take 40 mg by mouth daily.    Marland Kitchen oxyCODONE (OXY IR/ROXICODONE) 5 MG immediate release tablet Take 2 tablets (10 mg total) by mouth every 6 (six) hours as needed for severe pain. 240 tablet 0  . PEMEtrexed 500 mg/m2 in sodium  chloride 0.9 % 100 mL Inject 500 mg/m2 into the vein every 21 ( twenty-one) days.    . promethazine (PHENERGAN) 25 MG suppository Place 1 suppository (25 mg total) rectally every 6 (six) hours as needed for nausea or vomiting. 12 each 0  . sodium chloride 0.9 % SOLN 50 mL with pembrolizumab 100 MG/4ML SOLN 2 mg/kg Inject 200 mg into the vein every 21 ( twenty-one) days.    . sucralfate (CARAFATE) 1 g tablet Take 1 tablet (1 g total) by mouth 4 (four) times daily -  with meals and at bedtime. 60 tablet 1  . magnesium oxide (MAG-OX) 400 MG tablet Take 1 tablet (400 mg total) by mouth 2 (two) times daily. 60 tablet 3   No current facility-administered medications for this visit.    ALLERGIES:  No Known Allergies  PHYSICAL EXAM:  Performance status (ECOG): 1 - Symptomatic but completely ambulatory  Vitals:   05/08/20 0851 05/08/20 0913  BP: 109/75   Pulse: 91   Resp: 20   Temp: (!) 97.3 F (36.3 C)   SpO2: 92% 96%   Wt Readings from Last 3 Encounters:  05/08/20 207 lb (93.9 kg)  05/01/20 210 lb 12.8 oz (95.6 kg)  04/10/20 208 lb 8.9 oz (94.6 kg)   Physical Exam Vitals reviewed.  Constitutional:      Appearance: Normal appearance.  Cardiovascular:     Rate and Rhythm: Normal rate and regular rhythm.     Pulses: Normal pulses.     Heart sounds: Normal heart sounds.  Pulmonary:     Effort: Pulmonary effort is normal.     Breath sounds: Normal breath sounds.  Chest:     Comments: Port-a-Cath in L chest Musculoskeletal:     Lumbar back: Edema (R flank soft pouch approx 6 inch in diameter) present.  Neurological:     General: No focal deficit present.     Mental Status: He is alert and oriented to person, place, and time.  Psychiatric:        Mood and Affect: Mood normal.  Behavior: Behavior normal.     LABORATORY DATA:  I have reviewed the labs as listed.  CBC Latest Ref Rng & Units 05/08/2020 05/01/2020 04/10/2020  WBC 4.0 - 10.5 K/uL 7.8 4.4 5.7  Hemoglobin 13.0  - 17.0 g/dL 10.9(L) 9.1(L) 10.2(L)  Hematocrit 39.0 - 52.0 % 33.9(L) 28.3(L) 31.9(L)  Platelets 150 - 400 K/uL 83(L) 47(L) 100(L)   CMP Latest Ref Rng & Units 05/08/2020 05/01/2020 04/10/2020  Glucose 70 - 99 mg/dL 112(H) 120(H) 117(H)  BUN 8 - 23 mg/dL '16 14 15  ' Creatinine 0.61 - 1.24 mg/dL 0.98 0.79 0.78  Sodium 135 - 145 mmol/L 132(L) 133(L) 134(L)  Potassium 3.5 - 5.1 mmol/L 4.2 3.8 4.5  Chloride 98 - 111 mmol/L 96(L) 98 97(L)  CO2 22 - 32 mmol/L '25 26 26  ' Calcium 8.9 - 10.3 mg/dL 8.4(L) 8.3(L) 8.8(L)  Total Protein 6.5 - 8.1 g/dL 6.6 6.1(L) 6.6  Total Bilirubin 0.3 - 1.2 mg/dL 0.6 0.3 0.6  Alkaline Phos 38 - 126 U/L 119 85 79  AST 15 - 41 U/L '29 28 24  ' ALT 0 - 44 U/L '24 30 29    ' DIAGNOSTIC IMAGING:  I have independently reviewed the scans and discussed with the patient. MR Brain W Wo Contrast  Result Date: 05/02/2020 CLINICAL DATA:  Brain/CNS neoplasm, surveillance. Follow-up treated metastatic disease. EXAM: MRI HEAD WITHOUT AND WITH CONTRAST TECHNIQUE: Multiplanar, multiecho pulse sequences of the brain and surrounding structures were obtained without and with intravenous contrast. CONTRAST:  29m MULTIHANCE GADOBENATE DIMEGLUMINE 529 MG/ML IV SOLN COMPARISON:  MRI 01/24/2020. FINDINGS: Brain: No acute infarct. No mass abnormal mass effect or midline shift. No acute hemorrhage. No extra-axial fluid collection. No hydrocephalus. Possible new lesions: *There is a punctate focus of enhancement in the right frontal cortex (see series 11, image 136) which appears to be in a separate location than the focus of enhancement seen on the prior. *There is an additional punctate focus of enhancement in the inferior aspect of the right frontal lobe (series 11, image 102). *New punctate focus of enhancement in the posterior high right frontal lobe (series 11, image 151). Improved lesions: *The previously noted minimally enhancing medial left cerebellar lesion is no longer visualized. *The inferior  right cerebellar lesion has decreased in size, now measuring 5 mm on series 13, image 56 (previously 7 mm). *The previously seen other punctate cerebellar foci of enhancement are no longer visualized. *The previously seen punctate focus of enhancement in the right frontal lobe is not confidently visualized. Similar lesions: *Punctate focus of enhancement in the posterolateral left temporal cortex (series 11, image 100) is similar to prior. Likely vascular: Two small areas of enhancement in the right basal ganglia (series 11, image 117) are favored to be vascular given branching/linear appearance. Vascular: Major arterial flow voids are maintained at the skull base. Skull and upper cervical spine: Enhancing 1.3 cm left clival lesion appears slightly decreased in conspicuity. Sinuses/Orbits: Mild paranasal sinus mucosal thickening without air-fluid levels. Unremarkable orbits. Other: No mastoid effusions. IMPRESSION: 1. Three new punctate foci of enhancement in the right frontal lobe (as detailed above), which may represent new tiny metastases versus vessels. 2. Otherwise, multiple enhancing lesions described on prior are either smaller/less visible or stable, as detailed above. A punctate focus of enhancement in the lateral aspect of the posterolateral left temporal lobe was not described on the prior but appears similar. 3. Enhancing 1.3 cm left clival lesion appears slightly decreased in conspicuity. Electronically Signed   By:  Margaretha Sheffield MD   On: 05/02/2020 13:03     ASSESSMENT:  1. 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 Jenetta Downer. -CT abdomen on 12/03/2019 showed posterior right lobe lesion measuring 2.1 x 1.9 cm and another anterior liver dome lesion measuring 1.3 x 1.2 cm. Right adrenal mass measuring 3.3 x 1.8 cm. -MRI of the liver on 12/05/2019 showed rim-enhancing lesion of the right adrenal gland measuring 3.0  x 2.5 cm. There is rim-enhancing lesion of the superior pole of the left kidney measuring 1.9 x 1.7 cm. Right lobe of the liver lesion measuring 2.2 x 2.0 cm. Anterior liver dome lesion measuring 1.1 x 0.9 cm. -CT chest without contrast on 12/07/2019 showed 1.9 cm spiculated nodule in the right lung apex. Right paratracheal lymph node 1.3 cm. 1.5 cm lower right paratracheal node. -Right lobe of liver needle biopsy consistent with adenocarcinoma, positive for CK7, TTF-1. Negative for CK20, CDX2, Napsin a and CK 5/6. -PD-L1 TPS 1% -Foundation 1 with TMB high, MS-stable, no other targetable mutations. -PET scan on 01/21/2020 showed 2 cm nodule in the right lung apex, thoracic nodal metastasis. 2.5 cm hepatic meta stasis. Additional small hepatic lesions incompletely characterized. Bilateral adrenal meta stasis. Multifocal bone metastasis throughout the axial and appendicular skeleton. -Cycle 1 of carboplatin, pemetrexed and pembrolizumab started on 01/17/2020. -PET scan on 03/17/2020 showing interval partial metabolic response with no new or progressive hypermetabolic metastatic disease.  2. Prostate cancer: -Diagnosed in 2017, seed implants done in June 2018.  3. Social/family history: -He drives a Teacher, English as a foreign language. Quit smoking in 1992, 2 packs/day for 32 years. -Brother had prostate cancer. Sister had skin cancer and mother had throat cancer.  4. Brain metastasis: -MRI of the brain on 12/28/2019 shows bilateral cerebellar metastasis measuring 6 to 7 mm. Enhancing 1.2 cm left clival metastasis. -SRS to the brain lesion on 01/25/2020.   PLAN:  1. 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  He is not moving much. -Give a prescription for Rollator walker. -Reviewed CBC today.  Platelet count improved to 83 from 47.  Hemoglobin 10.9 and white count normal. -Will hold chemotherapy until platelet count more than 100 K. -If he  continues to feel weak, will consider discontinuing carboplatin dose for the last cycle.  After that he will continue with pemetrexed and pembrolizumab maintenance.  2. Brain metastasis: -I have discussed MRI of the brain results from 05/02/2020 which showed stable disease.  Plan to repeat MRI in 2 months.  3. Generalized pains: -Continue oxycodone 10 mg every 6 hours as needed.  4. Uncontrolled nausea/vomiting: -Continue Reglan every 8 hours and Carafate every 4 hours as needed.  5.  Hypomagnesemia: - Is taking magnesium once daily.  Magnesium today is 1.6. -We will increase it to magnesium twice daily.   Orders placed this encounter:  Orders Placed This Encounter  Procedures  . CBC with Differential/Platelet  . Comprehensive metabolic panel  . Magnesium     [name], MD [place] [phone]   I, Milinda Antis, am acting as a scribe for Dr. Sanda Linger.  I, [name] MD, have reviewed the above documentation for accuracy and completeness, and I agree with the above.

## 2020-05-08 NOTE — Progress Notes (Signed)
Patient was assessed by Dr. Delton Coombes and labs have been reviewed.  No treatment today due to low platelet count and weakness. Primary RN and pharmacy aware.

## 2020-05-08 NOTE — Patient Instructions (Signed)
[facility] at [facility] Discharge Instructions  Labs drawn from portacath today   Thank you for choosing [facility] at [facility] to provide your oncology and hematology care.  To afford each patient quality time with our provider, please arrive at least 15 minutes before your scheduled appointment time.   If you have a lab appointment with the Cancer Center please come in thru the Main Entrance and check in at the main information desk.  You need to re-schedule your appointment should you arrive 10 or more minutes late.  We strive to give you quality time with our providers, and arriving late affects you and other patients whose appointments are after yours.  Also, if you no show three or more times for appointments you may be dismissed from the clinic at the providers discretion.     Again, thank you for choosing [facility].  Our hope is that these requests will decrease the amount of time that you wait before being seen by our physicians.       _____________________________________________________________  Should you have questions after your visit to [facility], please contact our office at [phone] and follow the prompts.  Our office hours are 8:00 a.m. and 4:30 p.m. Monday - Friday.  Please note that voicemails left after 4:00 p.m. may not be returned until the following business day.  We are closed weekends and major holidays.  You do have access to a nurse 24-7, just call the main number to the clinic [phone] and do not press any options, hold on the line and a nurse will answer the phone.    For prescription refill requests, have your pharmacy contact our office and allow 72 hours.    Due to Covid, you will need to wear a mask upon entering the hospital. If you do not have a mask, a mask will be given to you at the Main Entrance upon arrival. For doctor visits, patients may have 1 support person age 18  or older with them. For treatment visits, patients can not have anyone with them due to social distancing guidelines and our immunocompromised population.     

## 2020-05-09 ENCOUNTER — Ambulatory Visit (HOSPITAL_COMMUNITY): Payer: Medicare Other

## 2020-05-09 NOTE — Progress Notes (Signed)
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  Patient receiving chemotherapy.    Spoke with patient via phone.  Patient reports that his appetite is good and he is eating whatever he wants (manwich, eggs, cornbread muffins, applesauce, beans, cake).  Unable to tolerate oral nutrition supplements as makes him sick.  Denies problems with nausea, constipation or diarrhea or trouble swallowing.      Medications: reglan and carafate  Labs: reviewed  Anthropometrics:   Weight 207 lb yesterday overall increased from 201 lb on 12/9    NUTRITION DIAGNOSIS: Inadequate oral intake improving   INTERVENTION:  Encouraged patient to continue eating high calorie, high protein foods.     MONITORING, EVALUATION, GOAL: weight trends, intake   NEXT VISIT: March 11 phone call  Lance Stafford B. Zenia Resides, [place], [employer] Registered Dietitian [phone] (mobile)

## 2020-05-15 ENCOUNTER — Inpatient Hospital Stay (HOSPITAL_COMMUNITY): Payer: Medicare Other

## 2020-05-15 ENCOUNTER — Inpatient Hospital Stay (HOSPITAL_COMMUNITY): Payer: Medicare Other | Attending: Hematology and Oncology | Admitting: Hematology

## 2020-05-15 ENCOUNTER — Other Ambulatory Visit: Payer: Self-pay

## 2020-05-15 VITALS — BP 130/75 | HR 68 | Temp 97.2°F | Resp 17

## 2020-05-15 VITALS — BP 122/76 | HR 71 | Temp 97.6°F | Resp 18

## 2020-05-15 DIAGNOSIS — Z87891 Personal history of nicotine dependence: Secondary | ICD-10-CM | POA: Insufficient documentation

## 2020-05-15 DIAGNOSIS — C3490 Malignant neoplasm of unspecified part of unspecified bronchus or lung: Secondary | ICD-10-CM

## 2020-05-15 DIAGNOSIS — Z5112 Encounter for antineoplastic immunotherapy: Secondary | ICD-10-CM | POA: Insufficient documentation

## 2020-05-15 DIAGNOSIS — Z79899 Other long term (current) drug therapy: Secondary | ICD-10-CM | POA: Diagnosis not present

## 2020-05-15 DIAGNOSIS — C7931 Secondary malignant neoplasm of brain: Secondary | ICD-10-CM | POA: Insufficient documentation

## 2020-05-15 DIAGNOSIS — C7971 Secondary malignant neoplasm of right adrenal gland: Secondary | ICD-10-CM | POA: Insufficient documentation

## 2020-05-15 DIAGNOSIS — C787 Secondary malignant neoplasm of liver and intrahepatic bile duct: Secondary | ICD-10-CM

## 2020-05-15 DIAGNOSIS — C3491 Malignant neoplasm of unspecified part of right bronchus or lung: Secondary | ICD-10-CM | POA: Insufficient documentation

## 2020-05-15 DIAGNOSIS — Z95828 Presence of other vascular implants and grafts: Secondary | ICD-10-CM

## 2020-05-15 LAB — CBC WITH DIFFERENTIAL/PLATELET
Abs Immature Granulocytes: 0.41 10*3/uL — ABNORMAL HIGH (ref 0.00–0.07)
Basophils Absolute: 0.1 10*3/uL (ref 0.0–0.1)
Basophils Relative: 1 %
Eosinophils Absolute: 0 10*3/uL (ref 0.0–0.5)
Eosinophils Relative: 0 %
HCT: 33.4 % — ABNORMAL LOW (ref 39.0–52.0)
Hemoglobin: 10.3 g/dL — ABNORMAL LOW (ref 13.0–17.0)
Immature Granulocytes: 5 %
Lymphocytes Relative: 20 %
Lymphs Abs: 1.8 10*3/uL (ref 0.7–4.0)
MCH: 33.9 pg (ref 26.0–34.0)
MCHC: 30.8 g/dL (ref 30.0–36.0)
MCV: 109.9 fL — ABNORMAL HIGH (ref 80.0–100.0)
Monocytes Absolute: 0.8 10*3/uL (ref 0.1–1.0)
Monocytes Relative: 9 %
Neutro Abs: 5.9 10*3/uL (ref 1.7–7.7)
Neutrophils Relative %: 65 %
Platelets: 74 10*3/uL — ABNORMAL LOW (ref 150–400)
RBC: 3.04 MIL/uL — ABNORMAL LOW (ref 4.22–5.81)
RDW: 18.6 % — ABNORMAL HIGH (ref 11.5–15.5)
WBC: 9 10*3/uL (ref 4.0–10.5)
nRBC: 1.3 % — ABNORMAL HIGH (ref 0.0–0.2)

## 2020-05-15 LAB — MAGNESIUM: Magnesium: 1.8 mg/dL (ref 1.7–2.4)

## 2020-05-15 LAB — COMPREHENSIVE METABOLIC PANEL
ALT: 23 U/L (ref 0–44)
AST: 21 U/L (ref 15–41)
Albumin: 2.8 g/dL — ABNORMAL LOW (ref 3.5–5.0)
Alkaline Phosphatase: 110 U/L (ref 38–126)
Anion gap: 10 (ref 5–15)
BUN: 19 mg/dL (ref 8–23)
CO2: 24 mmol/L (ref 22–32)
Calcium: 8.6 mg/dL — ABNORMAL LOW (ref 8.9–10.3)
Chloride: 99 mmol/L (ref 98–111)
Creatinine, Ser: 0.75 mg/dL (ref 0.61–1.24)
GFR, Estimated: >60 mL/min (ref >60)
Glucose, Bld: 117 mg/dL — ABNORMAL HIGH (ref 70–99)
Potassium: 4.2 mmol/L (ref 3.5–5.1)
Sodium: 133 mmol/L — ABNORMAL LOW (ref 135–145)
Total Bilirubin: 0.5 mg/dL (ref 0.3–1.2)
Total Protein: 6.9 g/dL (ref 6.5–8.1)

## 2020-05-15 MED ORDER — SUCRALFATE 1 G PO TABS: 1.0000 g | ORAL_TABLET | Freq: Three times a day (TID) | ORAL | 3 refills | Status: AC

## 2020-05-15 MED ORDER — CYANOCOBALAMIN 1000 MCG/ML IJ SOLN
1000.0000 ug | Freq: Once | INTRAMUSCULAR | Status: AC
  Administered 2020-05-15: 1000 ug via INTRAMUSCULAR
  Filled 2020-05-15: qty 1

## 2020-05-15 MED ORDER — HEPARIN SOD (PORK) LOCK FLUSH 100 UNIT/ML IV SOLN
500.0000 [IU] | Freq: Once | INTRAVENOUS | Status: AC | PRN
  Administered 2020-05-15: 500 [IU]

## 2020-05-15 MED ORDER — CYANOCOBALAMIN 1000 MCG/ML IJ SOLN: 1000.0000 ug | Freq: Once | INTRAMUSCULAR | Status: DC

## 2020-05-15 MED ORDER — SODIUM CHLORIDE 0.9 % IV SOLN
200.0000 mg | Freq: Once | INTRAVENOUS | Status: AC
  Administered 2020-05-15: 200 mg via INTRAVENOUS
  Filled 2020-05-15: qty 8

## 2020-05-15 MED ORDER — METOCLOPRAMIDE HCL 10 MG PO TABS: 10.0000 mg | ORAL_TABLET | Freq: Three times a day (TID) | ORAL | 1 refills | Status: AC

## 2020-05-15 MED ORDER — PALONOSETRON HCL INJECTION 0.25 MG/5ML
0.2500 mg | Freq: Once | INTRAVENOUS | Status: AC
  Administered 2020-05-15: 0.25 mg via INTRAVENOUS
  Filled 2020-05-15: qty 5

## 2020-05-15 MED ORDER — SODIUM CHLORIDE 0.9 % IV SOLN
10.0000 mg | Freq: Once | INTRAVENOUS | Status: AC
  Administered 2020-05-15: 10 mg via INTRAVENOUS
  Filled 2020-05-15: qty 10

## 2020-05-15 MED ORDER — SODIUM CHLORIDE 0.9 % IV SOLN
Freq: Once | INTRAVENOUS | Status: AC
Start: 2020-05-15 — End: 2020-05-15

## 2020-05-15 MED ORDER — SODIUM CHLORIDE 0.9% FLUSH
10.0000 mL | INTRAVENOUS | Status: DC | PRN
  Administered 2020-05-15 (×2): 10 mL

## 2020-05-15 MED ORDER — SODIUM CHLORIDE 0.9 % IV SOLN
300.0000 mg/m2 | Freq: Once | INTRAVENOUS | Status: AC
  Administered 2020-05-15: 700 mg via INTRAVENOUS
  Filled 2020-05-15: qty 20

## 2020-05-15 MED ORDER — DEXAMETHASONE 2 MG PO TABS: 2.0000 mg | ORAL_TABLET | Freq: Every day | ORAL | 0 refills | Status: AC

## 2020-05-15 NOTE — Progress Notes (Signed)
Patient was assessed by Dr. Delton Coombes and labs have been reviewed.  Patient is okay to proceed with treatment today. Dr. Delton Coombes informed pharmacy of dose changes for his chemo today. Vitamin B12 injection for today ordered per Dr. Delton Coombes. Primary RN and pharmacy aware.

## 2020-05-15 NOTE — Patient Instructions (Signed)
[facility] at [facility] Discharge Instructions  You were seen today by Dr. Delton Coombes. He went over your recent results. You received your treatment and vitamin B12 injection today. Start taking dexamethasone once daily for 10 days, then stop taking it. Dr. Delton Coombes will see you back in 3 weeks for labs and follow up.   Thank you for choosing [place] at [facility] to provide your oncology and hematology care.  To afford each patient quality time with our provider, please arrive at least 15 minutes before your scheduled appointment time.   If you have a lab appointment with the [facility] please come in thru the Main Entrance and check in at the main information desk  You need to re-schedule your appointment should you arrive 10 or more minutes late.  We strive to give you quality time with our providers, and arriving late affects you and other patients whose appointments are after yours.  Also, if you no show three or more times for appointments you may be dismissed from the clinic at the providers discretion.     Again, thank you for choosing [facility].  Our hope is that these requests will decrease the amount of time that you wait before being seen by our physicians.       _____________________________________________________________  Should you have questions after your visit to [facility], please contact our office at [phone] between the hours of 8:00 a.m. and 4:30 p.m.  Voicemails left after 4:00 p.m. will not be returned until the following business day.  For prescription refill requests, have your pharmacy contact our office and allow 72 hours.    Cancer Center Support Programs:   > Cancer Support Group  2nd Tuesday of the month 1pm-2pm, Journey Room

## 2020-05-15 NOTE — Progress Notes (Signed)
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  PRIOR THERAPY: SRS on 01/31/2020  NGS Results: PD-L1 TPS 1%, Foundation 1 MS--stable, TMB 10 Muts/Mb  CURRENT THERAPY: Carboplatin, pemetrexed, Keytruda & Aloxi every 3 weeks  BRIEF ONCOLOGIC HISTORY:  Oncology History  Malignant neoplasm of lung ([place])  12/20/2019 Initial Diagnosis   Adenocarcinoma of lung, stage 4, right ([place])   12/20/2019 Cancer Staging   Staging form: Lung, AJCC 8th Edition - Clinical: Stage IVB (cT1b, cN2, pM1c) - Signed by [name], MD on 12/20/2019   12/25/2019 Genetic Testing   PDL1     12/31/2019 [place] [facility]     01/17/2020 -  Chemotherapy    Patient is on Treatment Plan: LUNG CARBOPLATIN / PEMETREXED / PEMBROLIZUMAB Q21D INDUCTION X 4 CYCLES / MAINTENANCE PEMETREXED + PEMBROLIZUMAB        CANCER STAGING: Cancer Staging Malignant neoplasm of lung ([place]) Staging form: Lung, AJCC 8th Edition - Clinical: Stage IVB (cT1b, cN2, pM1c) - Signed by [name], MD on 12/20/2019   INTERVAL HISTORY:  [name], a 75 y.o. male, returns for routine follow-up and consideration for next cycle of chemotherapy. Lance Stafford was last seen on 05/08/2020.  Due for cycle #6 of carboplatin, pemetrexed, Keytruda and Aloxi today.   Today he is accompanied by his wife. Overall, he tells me he has been feeling better than last week. He has been moving more but continues having pain in his right hip and cramping in his right thigh, and his back pain has improved. He has nosebleeds when he blows his nose to hard. He continues taking folic acid.  He is requesting a lift chair so that he can recline and get up without the chair swiveling and without  falling.  Overall, he feels ready for next cycle of chemo today.    REVIEW OF SYSTEMS:  Review of Systems  Constitutional: Positive for fatigue (50%). Negative for appetite change.  HENT:   Positive for nosebleeds (w/ forceful blowing).   Respiratory: Positive for shortness of breath.   Musculoskeletal: Positive for arthralgias (pain in R hip) and myalgias (4/10 generalized body pain; cramping in R thigh).  All other systems reviewed and are negative.   PAST MEDICAL/SURGICAL HISTORY:  Past Medical History:  Diagnosis Date  . Arthritis   . Chronic low back pain    truck driver  . GERD (gastroesophageal reflux disease)   . Hyperlipidemia   . Lung cancer ([place])    stage IV non small cell lung ca  . Nocturia   . Port-A-Cath in place 01/10/2020  . Prostate cancer ([place]) UROLOGIST-  DR WRENN/  ONCOLOGIST-  DR MANNING   dx 02/ 2017via TRUSPbx---  Stage T1c,  Gleason 3+3,  PSA 11.9  . Wears glasses   . Wears partial dentures    upper and lower   Past Surgical History:  Procedure Laterality Date  . CATARACT EXTRACTION W/ INTRAOCULAR LENS  IMPLANT, BILATERAL  2015  . CHOLECYSTECTOMY    . CYSTOSCOPY  09/30/2016   Procedure: CYSTOSCOPY;  Surgeon: Irine Seal, MD;  Location: [facility];  Service: Urology;;  no seeds found in bladder  . PORTACATH PLACEMENT Left 01/14/2020   Procedure: INSERTION PORT-A-CATH;  Surgeon: Aviva Signs, MD;  Location: [facility];  Service: General;  Laterality: Left;  . RADIOACTIVE SEED IMPLANT N/A 09/30/2016   Procedure: RADIOACTIVE SEED IMPLANT/BRACHYTHERAPY IMPLANT, SPACE OAR;  Surgeon: Irine Seal, MD;  Location[facility];  Service: Urology;  Laterality: N/A;  70 seeds implanted  . SPERMATOCELECTOMY Left 01/30/2015   Procedure: SPERMATOCELECTOMY;  Surgeon: Irine Seal, MD;  Location: [facility];  Service: Urology;  Laterality: Left;    SOCIAL HISTORY:  Social History   Socioeconomic History  . Marital status:  Married    Spouse name: Not on file  . Number of children: 2  . Years of education: Not on file  . Highest education level: Not on file  Occupational History  . Occupation: truck Geophysicist/field seismologist  Tobacco Use  . Smoking status: Former Smoker    Packs/day: 2.00    Years: 33.00    Pack years: 66.00    Types: Cigarettes    Quit date: 01/23/1991    Years since quitting: 29.3  . Smokeless tobacco: Never Used  Vaping Use  . Vaping Use: Never used  Substance and Sexual Activity  . Alcohol use: No  . Drug use: No  . Sexual activity: Yes  Other Topics Concern  . Not on file  Social History Narrative  . Not on file   Social Determinants of Health   Financial Resource Strain: Low Risk   . Difficulty of Paying Living Expenses: Not hard at all  Food Insecurity: No Food Insecurity  . Worried About Charity fundraiser in the Last Year: Never true  . Ran Out of Food in the Last Year: Never true  Transportation Needs: No Transportation Needs  . Lack of Transportation (Medical): No  . Lack of Transportation (Non-Medical): No  Physical Activity: Sufficiently Active  . Days of Exercise per Week: 1 day  . Minutes of Exercise per Session: 150+ min  Stress: No Stress Concern Present  . Feeling of Stress : Only a little  Social Connections: Socially Integrated  . Frequency of Communication with Friends and Family: More than three times a week  . Frequency of Social Gatherings with Friends and Family: Three times a week  . Attends Religious Services: More than 4 times per year  . Active Member of Clubs or Organizations: Yes  . Attends Archivist Meetings: More than 4 times per year  . Marital Status: Married  Human resources officer Violence: Not At Risk  . Fear of Current or Ex-Partner: No  . Emotionally Abused: No  . Physically Abused: No  . Sexually Abused: No    FAMILY HISTORY:  Family History  Problem Relation Age of Onset  . Diabetes Mother   . Stroke Mother   . Throat cancer  Mother   . Skin cancer Sister   . Prostate cancer Brother   . Cancer Paternal Grandmother   . Clotting disorder Daughter     CURRENT MEDICATIONS:  Current Outpatient Medications  Medication Sig Dispense Refill  . aspirin EC 81 MG tablet Take 81 mg by mouth daily.     Marland Kitchen atorvastatin (LIPITOR) 40 MG tablet Take 40 mg by mouth daily.    Marland Kitchen CARBOPLATIN IV Inject into the vein every 21 ( twenty-one) days.    Marland Kitchen diltiazem (CARDIZEM CD) 120 MG 24 hr capsule Take 1 capsule (120 mg total) by mouth daily. 30 capsule 11  . docusate sodium (COLACE) 100 MG capsule Take 200 mg by mouth daily.    . folic acid (FOLVITE) 1  MG tablet Take 1 tablet (1 mg total) by mouth daily. 90 tablet 1  . KLOR-CON M20 20 MEQ tablet TAKE 1 TABLET BY MOUTH EVERY DAY 30 tablet 1  . Lactulose 20 GM/30ML SOLN Take 30 ml by mouth every 3 hours until bowel movement is had; then continue taking 30 ml by mouth once daily 450 mL 2  . lidocaine-prilocaine (EMLA) cream Apply a small amount to port a cath site and cover with plastic wrap 1 hour prior to chemotherapy appointments 30 g 3  . magnesium oxide (MAG-OX) 400 MG tablet Take 1 tablet (400 mg total) by mouth 2 (two) times daily. 60 tablet 3  . Misc. Devices MISC Please provide patient with rollaider walker with cushioned seat. 1 each 0  . omeprazole (PRILOSEC) 40 MG capsule Take 40 mg by mouth daily.    Marland Kitchen oxyCODONE (OXY IR/ROXICODONE) 5 MG immediate release tablet Take 2 tablets (10 mg total) by mouth every 6 (six) hours as needed for severe pain. 240 tablet 0  . PEMEtrexed 500 mg/m2 in sodium chloride 0.9 % 100 mL Inject 500 mg/m2 into the vein every 21 ( twenty-one) days.    . promethazine (PHENERGAN) 25 MG suppository Place 1 suppository (25 mg total) rectally every 6 (six) hours as needed for nausea or vomiting. 12 each 0  . sodium chloride 0.9 % SOLN 50 mL with pembrolizumab 100 MG/4ML SOLN 2 mg/kg Inject 200 mg into the vein every 21 ( twenty-one) days.    Marland Kitchen dexamethasone  (DECADRON) 2 MG tablet Take 1 tablet (2 mg total) by mouth daily. 10 tablet 0  . metoCLOPramide (REGLAN) 10 MG tablet Take 1 tablet (10 mg total) by mouth every 8 (eight) hours. 90 tablet 1  . sucralfate (CARAFATE) 1 g tablet Take 1 tablet (1 g total) by mouth 4 (four) times daily -  with meals and at bedtime. 120 tablet 3   No current facility-administered medications for this visit.   Facility-Administered Medications Ordered in Other Visits  Medication Dose Route Frequency Provider Last Rate Last Admin  . cyanocobalamin ((VITAMIN B-12)) injection 1,000 mcg  1,000 mcg Intramuscular Once [name], MD      . dexamethasone (DECADRON) 10 mg in sodium chloride 0.9 % 50 mL IVPB  10 mg Intravenous Once [name], MD      . heparin lock flush 100 unit/mL  500 Units Intracatheter Once PRN [name], MD      . palonosetron (ALOXI) injection 0.25 mg  0.25 mg Intravenous Once [name], MD      . pembrolizumab [facility]) 200 mg in sodium chloride 0.9 % 50 mL chemo infusion  200 mg Intravenous Once [name], MD      . PEMEtrexed (ALIMTA) 700 mg in sodium chloride 0.9 % 100 mL chemo infusion  300 mg/m2 (Treatment Plan Recorded) Intravenous Once [name], MD      . sodium chloride flush (NS) 0.9 % injection 10 mL  10 mL Intracatheter PRN [name], MD   10 mL at 05/15/20 0934    ALLERGIES:  No Known Allergies  PHYSICAL EXAM:  Performance status (ECOG): 1 - Symptomatic but completely ambulatory  Vitals:   05/15/20 0804  BP: 130/75  Pulse: 68  Resp: 17  Temp: (!) 97.2 F (36.2 C)  SpO2: 99%   Wt Readings from Last 3 Encounters:  05/08/20 207 lb (93.9 kg)  05/01/20 210 lb 12.8 oz (95.6 kg)  04/10/20 208 lb 8.9 oz (94.6 kg)  Physical Exam Vitals reviewed.  Constitutional:      Appearance: Normal appearance.  Cardiovascular:     Rate and Rhythm: Normal rate and regular rhythm.     Pulses: Normal pulses.      Heart sounds: Normal heart sounds.  Pulmonary:     Effort: Pulmonary effort is normal.     Breath sounds: Normal breath sounds.  Chest:     Comments: Port-a-Cath in L chest Musculoskeletal:     Lumbar back: No tenderness or bony tenderness.  Neurological:     General: No focal deficit present.     Mental Status: He is alert and oriented to person, place, and time.  Psychiatric:        Mood and Affect: Mood normal.        Behavior: Behavior normal.     LABORATORY DATA:  I have reviewed the labs as listed.  CBC Latest Ref Rng & Units 05/15/2020 05/08/2020 05/01/2020  WBC 4.0 - 10.5 K/uL 9.0 7.8 4.4  Hemoglobin 13.0 - 17.0 g/dL 10.3(L) 10.9(L) 9.1(L)  Hematocrit 39.0 - 52.0 % 33.4(L) 33.9(L) 28.3(L)  Platelets 150 - 400 K/uL 74(L) 83(L) 47(L)   CMP Latest Ref Rng & Units 05/15/2020 05/08/2020 05/01/2020  Glucose 70 - 99 mg/dL 117(H) 112(H) 120(H)  BUN 8 - 23 mg/dL '19 16 14  ' Creatinine 0.61 - 1.24 mg/dL 0.75 0.98 0.79  Sodium 135 - 145 mmol/L 133(L) 132(L) 133(L)  Potassium 3.5 - 5.1 mmol/L 4.2 4.2 3.8  Chloride 98 - 111 mmol/L 99 96(L) 98  CO2 22 - 32 mmol/L '24 25 26  ' Calcium 8.9 - 10.3 mg/dL 8.6(L) 8.4(L) 8.3(L)  Total Protein 6.5 - 8.1 g/dL 6.9 6.6 6.1(L)  Total Bilirubin 0.3 - 1.2 mg/dL 0.5 0.6 0.3  Alkaline Phos 38 - 126 U/L 110 119 85  AST 15 - 41 U/L '21 29 28  ' ALT 0 - 44 U/L '23 24 30    ' DIAGNOSTIC IMAGING:  I have independently reviewed the scans and discussed with the patient. MR Brain W Wo Contrast  Result Date: 05/02/2020 CLINICAL DATA:  Brain/CNS neoplasm, surveillance. Follow-up treated metastatic disease. EXAM: MRI HEAD WITHOUT AND WITH CONTRAST TECHNIQUE: Multiplanar, multiecho pulse sequences of the brain and surrounding structures were obtained without and with intravenous contrast. CONTRAST:  39m MULTIHANCE GADOBENATE DIMEGLUMINE 529 MG/ML IV SOLN COMPARISON:  MRI 01/24/2020. FINDINGS: Brain: No acute infarct. No mass abnormal mass effect or midline shift. No acute  hemorrhage. No extra-axial fluid collection. No hydrocephalus. Possible new lesions: *There is a punctate focus of enhancement in the right frontal cortex (see series 11, image 136) which appears to be in a separate location than the focus of enhancement seen on the prior. *There is an additional punctate focus of enhancement in the inferior aspect of the right frontal lobe (series 11, image 102). *New punctate focus of enhancement in the posterior high right frontal lobe (series 11, image 151). Improved lesions: *The previously noted minimally enhancing medial left cerebellar lesion is no longer visualized. *The inferior right cerebellar lesion has decreased in size, now measuring 5 mm on series 13, image 56 (previously 7 mm). *The previously seen other punctate cerebellar foci of enhancement are no longer visualized. *The previously seen punctate focus of enhancement in the right frontal lobe is not confidently visualized. Similar lesions: *Punctate focus of enhancement in the posterolateral left temporal cortex (series 11, image 100) is similar to prior. Likely vascular: Two small areas of enhancement in the right basal ganglia (series  11, image 117) are favored to be vascular given branching/linear appearance. Vascular: Major arterial flow voids are maintained at the skull base. Skull and upper cervical spine: Enhancing 1.3 cm left clival lesion appears slightly decreased in conspicuity. Sinuses/Orbits: Mild paranasal sinus mucosal thickening without air-fluid levels. Unremarkable orbits. Other: No mastoid effusions. IMPRESSION: 1. Three new punctate foci of enhancement in the right frontal lobe (as detailed above), which may represent new tiny metastases versus vessels. 2. Otherwise, multiple enhancing lesions described on prior are either smaller/less visible or stable, as detailed above. A punctate focus of enhancement in the lateral aspect of the posterolateral left temporal lobe was not described on the  prior but appears similar. 3. Enhancing 1.3 cm left clival lesion appears slightly decreased in conspicuity. Electronically Signed   By: Margaretha Sheffield MD   On: 05/02/2020 13:03     ASSESSMENT:  1. 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 Jenetta Downer. -CT abdomen on 12/03/2019 showed posterior right lobe lesion measuring 2.1 x 1.9 cm and another anterior liver dome lesion measuring 1.3 x 1.2 cm. Right adrenal mass measuring 3.3 x 1.8 cm. -MRI of the liver on 12/05/2019 showed rim-enhancing lesion of the right adrenal gland measuring 3.0 x 2.5 cm. There is rim-enhancing lesion of the superior pole of the left kidney measuring 1.9 x 1.7 cm. Right lobe of the liver lesion measuring 2.2 x 2.0 cm. Anterior liver dome lesion measuring 1.1 x 0.9 cm. -CT chest without contrast on 12/07/2019 showed 1.9 cm spiculated nodule in the right lung apex. Right paratracheal lymph node 1.3 cm. 1.5 cm lower right paratracheal node. -Right lobe of liver needle biopsy consistent with adenocarcinoma, positive for CK7, TTF-1. Negative for CK20, CDX2, Napsin a and CK 5/6. -PD-L1 TPS 1% -Foundation 1 with TMB high, MS-stable, no other targetable mutations. -PET scan on 01/21/2020 showed 2 cm nodule in the right lung apex, thoracic nodal metastasis. 2.5 cm hepatic meta stasis. Additional small hepatic lesions incompletely characterized. Bilateral adrenal meta stasis. Multifocal bone metastasis throughout the axial and appendicular skeleton. -Cycle 1 of carboplatin, pemetrexed and pembrolizumab started on 01/17/2020. -PET scan on 03/17/2020 showing interval partial metabolic response with no new or progressive hypermetabolic metastatic disease.  2. Prostate cancer: -Diagnosed in 2017, seed implants done in June 2018.  3. Social/family history: -He drives a Teacher, English as a foreign language. Quit smoking in 1992, 2 packs/day for 32 years. -Brother had  prostate cancer. Sister had skin cancer and mother had throat cancer.  4. Brain metastasis: -MRI of the brain on 12/28/2019 shows bilateral cerebellar metastasis measuring 6 to 7 mm. Enhancing 1.2 cm left clival metastasis. -SRS to the brain lesion on 01/25/2020.   PLAN:  1. 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 -Reviewed CBC from today which showed platelet count is 74.  White count is normal.  It has not improved in the last 2 weeks. -We will proceed with pemetrexed (dose reduced) and pembrolizumab.  We will discontinue carboplatin. -I plan to repeat PET scan in March.  RTC 3 weeks for follow-up. -He is currently on taper dexamethasone.  I have told him to take 2 mg tablet daily for 10 days and discontinue it.  2. Brain metastasis: -Brain MRI on 05/02/2020 showed stable disease.  Repeat MRI in 2 months.  3. Generalized pains: -Continue oxycodone 10 mg every 6 hours as needed.  4. Uncontrolled  nausea/vomiting: -Continue Reglan every 8 hours and Carafate every 4 hours as needed.  5. Hypomagnesemia: -Continue magnesium once daily.   Orders placed this encounter:  No orders of the defined types were placed in this encounter.    [name], MD [place] [phone]   I, Milinda Antis, am acting as a scribe for Dr. Sanda Linger.  I, [name] MD, have reviewed the above documentation for accuracy and completeness, and I agree with the above.

## 2020-05-15 NOTE — Progress Notes (Signed)
Received notice from Dr Delton Coombes:  Discontinuing Carboplatin and reducing Alimta dose due to low platelets.  Proceed with treatment and modify pre-medications, continue Aloxi and Dexamethasone for today.  V.O. Dr Gerrie Nordmann Aurora Mask, PharmD 05/15/20 @ [phone]

## 2020-05-15 NOTE — Progress Notes (Signed)
Infusions tolerated without incident or complaint. VSS upon completion of treatment. Port flushed and deaccessed per protocol, see MAR and IV flowsheet for details. Discharged in satisfactory condition with follow up instructions.

## 2020-05-16 ENCOUNTER — Other Ambulatory Visit (HOSPITAL_COMMUNITY): Payer: Self-pay

## 2020-05-16 MED ORDER — MISC. DEVICES MISC: 0 refills | Status: DC

## 2020-05-16 MED ORDER — MISC. DEVICES MISC: 0 refills | Status: AC

## 2020-05-16 NOTE — Progress Notes (Signed)
mi

## 2020-05-20 ENCOUNTER — Other Ambulatory Visit (HOSPITAL_COMMUNITY): Payer: Self-pay | Admitting: Surgery

## 2020-05-20 DIAGNOSIS — C3491 Malignant neoplasm of unspecified part of right bronchus or lung: Secondary | ICD-10-CM

## 2020-05-20 MED ORDER — ATORVASTATIN CALCIUM 40 MG PO TABS: 40.0000 mg | ORAL_TABLET | Freq: Every day | ORAL | 3 refills | Status: AC

## 2020-05-25 ENCOUNTER — Other Ambulatory Visit (HOSPITAL_COMMUNITY): Payer: Self-pay | Admitting: Hematology

## 2020-05-26 ENCOUNTER — Other Ambulatory Visit (HOSPITAL_COMMUNITY): Payer: Self-pay | Admitting: Hematology

## 2020-06-03 ENCOUNTER — Other Ambulatory Visit: Payer: Self-pay | Admitting: Radiation Therapy

## 2020-06-03 DIAGNOSIS — C7931 Secondary malignant neoplasm of brain: Secondary | ICD-10-CM

## 2020-06-04 ENCOUNTER — Other Ambulatory Visit: Payer: Self-pay | Admitting: Radiation Therapy

## 2020-06-04 DIAGNOSIS — C7931 Secondary malignant neoplasm of brain: Secondary | ICD-10-CM

## 2020-06-04 NOTE — Progress Notes (Signed)
Port Access for [facility] Brain MRI.

## 2020-06-05 ENCOUNTER — Inpatient Hospital Stay (HOSPITAL_COMMUNITY): Payer: Medicare Other

## 2020-06-05 ENCOUNTER — Other Ambulatory Visit: Payer: Self-pay

## 2020-06-05 ENCOUNTER — Ambulatory Visit (HOSPITAL_COMMUNITY)
Admission: RE | Admit: 2020-06-05 | Discharge: 2020-06-05 | Disposition: A | Discharge status: Home / Self Care | Payer: Medicare Other | Source: Ambulatory Visit | Attending: Hematology | Admitting: Hematology

## 2020-06-05 ENCOUNTER — Inpatient Hospital Stay (HOSPITAL_BASED_OUTPATIENT_CLINIC_OR_DEPARTMENT_OTHER): Payer: Medicare Other | Admitting: Hematology

## 2020-06-05 VITALS — BP 106/79 | HR 110 | Temp 97.7°F | Resp 18 | Wt 213.8 lb

## 2020-06-05 DIAGNOSIS — C787 Secondary malignant neoplasm of liver and intrahepatic bile duct: Secondary | ICD-10-CM | POA: Insufficient documentation

## 2020-06-05 DIAGNOSIS — C3491 Malignant neoplasm of unspecified part of right bronchus or lung: Secondary | ICD-10-CM

## 2020-06-05 DIAGNOSIS — Z5112 Encounter for antineoplastic immunotherapy: Secondary | ICD-10-CM | POA: Diagnosis not present

## 2020-06-05 LAB — CBC WITH DIFFERENTIAL/PLATELET
Abs Immature Granulocytes: 0.24 10*3/uL — ABNORMAL HIGH (ref 0.00–0.07)
Basophils Absolute: 0.1 10*3/uL (ref 0.0–0.1)
Basophils Relative: 1 %
Eosinophils Absolute: 0 10*3/uL (ref 0.0–0.5)
Eosinophils Relative: 0 %
HCT: 32.5 % — ABNORMAL LOW (ref 39.0–52.0)
Hemoglobin: 10.3 g/dL — ABNORMAL LOW (ref 13.0–17.0)
Immature Granulocytes: 3 %
Lymphocytes Relative: 22 %
Lymphs Abs: 1.7 10*3/uL (ref 0.7–4.0)
MCH: 33.7 pg (ref 26.0–34.0)
MCHC: 31.7 g/dL (ref 30.0–36.0)
MCV: 106.2 fL — ABNORMAL HIGH (ref 80.0–100.0)
Monocytes Absolute: 1.3 10*3/uL — ABNORMAL HIGH (ref 0.1–1.0)
Monocytes Relative: 17 %
Neutro Abs: 4.3 10*3/uL (ref 1.7–7.7)
Neutrophils Relative %: 57 %
Platelets: 83 10*3/uL — ABNORMAL LOW (ref 150–400)
RBC: 3.06 MIL/uL — ABNORMAL LOW (ref 4.22–5.81)
RDW: 15.9 % — ABNORMAL HIGH (ref 11.5–15.5)
WBC: 7.5 10*3/uL (ref 4.0–10.5)
nRBC: 0.5 % — ABNORMAL HIGH (ref 0.0–0.2)

## 2020-06-05 LAB — COMPREHENSIVE METABOLIC PANEL
ALT: 25 U/L (ref 0–44)
AST: 30 U/L (ref 15–41)
Albumin: 2.6 g/dL — ABNORMAL LOW (ref 3.5–5.0)
Alkaline Phosphatase: 158 U/L — ABNORMAL HIGH (ref 38–126)
Anion gap: 12 (ref 5–15)
BUN: 14 mg/dL (ref 8–23)
CO2: 23 mmol/L (ref 22–32)
Calcium: 8.3 mg/dL — ABNORMAL LOW (ref 8.9–10.3)
Chloride: 95 mmol/L — ABNORMAL LOW (ref 98–111)
Creatinine, Ser: 1.08 mg/dL (ref 0.61–1.24)
GFR, Estimated: >60 mL/min (ref >60)
Glucose, Bld: 173 mg/dL — ABNORMAL HIGH (ref 70–99)
Potassium: 3.9 mmol/L (ref 3.5–5.1)
Sodium: 130 mmol/L — ABNORMAL LOW (ref 135–145)
Total Bilirubin: 0.8 mg/dL (ref 0.3–1.2)
Total Protein: 6.7 g/dL (ref 6.5–8.1)

## 2020-06-05 LAB — MAGNESIUM: Magnesium: 1.7 mg/dL (ref 1.7–2.4)

## 2020-06-05 MED ORDER — HEPARIN SOD (PORK) LOCK FLUSH 100 UNIT/ML IV SOLN
500.0000 [IU] | Freq: Once | INTRAVENOUS | Status: AC
  Administered 2020-06-05: 500 [IU] via INTRAVENOUS

## 2020-06-05 MED ORDER — SODIUM CHLORIDE 0.9% FLUSH
10.0000 mL | INTRAVENOUS | Status: DC | PRN
  Administered 2020-06-05: 10 mL via INTRAVENOUS

## 2020-06-05 NOTE — Patient Instructions (Signed)
[place] at [facility] Discharge Instructions  You were seen today by Dr. Delton Coombes. He went over your recent results. You did not receive your treatment today, but you had a chest x-ray done today for your chest pain. You will also be scheduled to have a PET scan done before your next visit. Dr. Delton Coombes will see you back after the PET scan for labs and follow up.   Thank you for choosing [place] at [facility] to provide your oncology and hematology care.  To afford each patient quality time with our provider, please arrive at least 15 minutes before your scheduled appointment time.   If you have a lab appointment with the [facility] please come in thru the Main Entrance and check in at the main information desk  You need to re-schedule your appointment should you arrive 10 or more minutes late.  We strive to give you quality time with our providers, and arriving late affects you and other patients whose appointments are after yours.  Also, if you no show three or more times for appointments you may be dismissed from the clinic at the providers discretion.     Again, thank you for choosing [facility].  Our hope is that these requests will decrease the amount of time that you wait before being seen by our physicians.       _____________________________________________________________  Should you have questions after your visit to [facility], please contact our office at [phone] between the hours of 8:00 a.m. and 4:30 p.m.  Voicemails left after 4:00 p.m. will not be returned until the following business day.  For prescription refill requests, have your pharmacy contact our office and allow 72 hours.    Cancer Center Support Programs:   > Cancer Support Group  2nd Tuesday of the month 1pm-2pm, Journey Room

## 2020-06-05 NOTE — Progress Notes (Signed)
Patient was assessed by Dr. Delton Coombes and labs have been reviewed. NO treatment today due to weakness. Primary RN and pharmacy aware.

## 2020-06-05 NOTE — Progress Notes (Signed)
Pt here for D1C7 of alimta and [place].  Pt is having pain in right side of chest/abdomen/rib area.  No treatment today due to weakness.

## 2020-06-05 NOTE — Progress Notes (Signed)
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  PRIOR THERAPY: SRS on 01/31/2020  NGS Results: PD-L1 TPS 1%, Foundation 1 MS--stable, TMB 38 Muts/Mb  CURRENT THERAPY: Keytruda & pemetrexed every 3 weeks  BRIEF ONCOLOGIC HISTORY:  Oncology History  Malignant neoplasm of lung ([place])  12/20/2019 Initial Diagnosis   Adenocarcinoma of lung, stage 4, right ([place])   12/20/2019 Cancer Staging   Staging form: Lung, AJCC 8th Edition - Clinical: Stage IVB (cT1b, cN2, pM1c) - Signed by [name], MD on 12/20/2019   12/25/2019 Genetic Testing   PDL1     12/31/2019 [facility]     01/17/2020 -  Chemotherapy    Patient is on Treatment Plan: LUNG CARBOPLATIN / PEMETREXED / PEMBROLIZUMAB Q21D INDUCTION X 4 CYCLES / MAINTENANCE PEMETREXED + PEMBROLIZUMAB        CANCER STAGING: Cancer Staging Malignant neoplasm of lung ([place]) Staging form: Lung, AJCC 8th Edition - Clinical: Stage IVB (cT1b, cN2, pM1c) - Signed by [name], MD on 12/20/2019   INTERVAL HISTORY:  [name], a 75 y.o. male, returns for routine follow-up and consideration for next cycle of chemotherapy. Lance Stafford was last seen on 05/15/2020.  Due for cycle #7 of Keytruda and pemetrexed today.   Today he is accompanied his wife. Overall, he tells me he has been feeling okay. He complains of having pain in his right ribs and around the back after falling on 02/17 while walking to get the mail and the pain started on 02/22; the pain is worse when he inhales deeply. He was able to stand up on his own 3 days ago, but for the past 2 days he has not been able to stand up on his own. He is taking oxycodone 10 mg every 6 hours which is not helping as much as previously. He  denies having having nausea and he is taking Reglan and Carafate. He has finished the Decadron. His appetite is okay and he is eating until he gets full.  His wife is wondering if he can get homecare to help with his daily activities since he is not able to get up from a sitting position and is not able to shower himself. He is scheduled to have an MRI brain on 03/11.  He will not get treatment today due to feeling weak and recovering from his fall.   REVIEW OF SYSTEMS:  Review of Systems  Constitutional: Positive for fatigue (depleted). Negative for appetite change.  Cardiovascular: Positive for chest pain (7/10 R ribs pain radiating around back).  Gastrointestinal: Negative for nausea.  Musculoskeletal: Positive for back pain (7/10 R flank & back pain).  Neurological: Positive for numbness (hands & feet).  Hematological: Bruises/bleeds easily (R arm bruising after fall).  Psychiatric/Behavioral: Negative for sleep disturbance.  All other systems reviewed and are negative.   PAST MEDICAL/SURGICAL HISTORY:  Past Medical History:  Diagnosis Date   Arthritis    Chronic low back pain    truck driver   GERD (gastroesophageal reflux disease)    Hyperlipidemia    Lung cancer (HCC)    stage IV non small cell lung ca   Nocturia    Port-A-Cath in place 01/10/2020   Prostate cancer ([place]) UROLOGIST-  DR WRENN/  ONCOLOGIST-  DR MANNING   dx 02/ 2017via TRUSPbx---  Stage T1c,  Gleason 3+3,  PSA 11.9   Wears glasses    Wears partial dentures    upper and lower   Past Surgical History:  Procedure Laterality Date   CATARACT EXTRACTION W/ INTRAOCULAR LENS  IMPLANT, BILATERAL  2015   CHOLECYSTECTOMY     CYSTOSCOPY  09/30/2016   Procedure: CYSTOSCOPY;  Surgeon: Irine Seal, MD;  Location: [facility];  Service: Urology;;  no seeds found in bladder   PORTACATH PLACEMENT Left 01/14/2020   Procedure: INSERTION PORT-A-CATH;  Surgeon: Aviva Signs, MD;  Location: [facility];  Service: General;  Laterality: Left;   RADIOACTIVE SEED IMPLANT N/A 09/30/2016   Procedure: RADIOACTIVE SEED IMPLANT/BRACHYTHERAPY IMPLANT, SPACE OAR;  Surgeon: Irine Seal, MD;  Location: [facility];  Service: Urology;  Laterality: N/A;  70 seeds implanted   SPERMATOCELECTOMY Left 01/30/2015   Procedure: SPERMATOCELECTOMY;  Surgeon: Irine Seal, MD;  Location: [facility];  Service: Urology;  Laterality: Left;    SOCIAL HISTORY:  Social History   Socioeconomic History   Marital status: Married    Spouse name: Not on file   Number of children: 2   Years of education: Not on file   Highest education level: Not on file  Occupational History   Occupation: truck driver  Tobacco Use   Smoking status: Former Smoker    Packs/day: 2.00    Years: 33.00    Pack years: 66.00    Types: Cigarettes    Quit date: 01/23/1991    Years since quitting: 29.3   Smokeless tobacco: Never Used  Vaping Use   Vaping Use: Never used  Substance and Sexual Activity   Alcohol use: No   Drug use: No   Sexual activity: Yes  Other Topics Concern   Not on file  Social History Narrative   Not on file   Social Determinants of Health   Financial Resource Strain: Low Risk    Difficulty of Paying Living Expenses: Not hard at all  Food Insecurity: No Food Insecurity   Worried About Charity fundraiser in the Last Year: Never true   [place] in the Last Year: Never true  Transportation Needs: No Transportation Needs   Lack of Transportation (Medical): No   Lack of Transportation (Non-Medical): No  Physical Activity: Sufficiently Active   Days of Exercise per Week: 1 day   Minutes of Exercise per Session: 150+ min  Stress: No Stress Concern Present   Feeling of Stress : Only a little  Social Connections: Engineer, building services of Communication with Friends and Family: More than three times a week   Frequency of Social Gatherings  with Friends and Family: Three times a week   Attends Religious Services: More than 4 times per year   Active Member of Clubs or Organizations: Yes   Attends Music therapist: More than 4 times per year   Marital Status: Married  Human resources officer Violence: Not At Risk   Fear of Current or Ex-Partner: No   Emotionally Abused: No   Physically Abused: No   Sexually Abused: No    FAMILY HISTORY:  Family History  Problem Relation Age of Onset   Diabetes Mother    Stroke Mother    Throat cancer Mother    Skin cancer Sister    Prostate cancer Brother    Cancer Paternal Grandmother    Clotting disorder Daughter  CURRENT MEDICATIONS:  Current Outpatient Medications  Medication Sig Dispense Refill   aspirin EC 81 MG tablet Take 81 mg by mouth daily.      atorvastatin (LIPITOR) 40 MG tablet Take 1 tablet (40 mg total) by mouth daily. 30 tablet 3   CARBOPLATIN IV Inject into the vein every 21 ( twenty-one) days.     dexamethasone (DECADRON) 2 MG tablet Take 1 tablet (2 mg total) by mouth daily. 10 tablet 0   diltiazem (CARDIZEM CD) 120 MG 24 hr capsule Take 1 capsule (120 mg total) by mouth daily. 30 capsule 11   docusate sodium (COLACE) 100 MG capsule Take 200 mg by mouth daily.     folic acid (FOLVITE) 1 MG tablet Take 1 tablet (1 mg total) by mouth daily. 90 tablet 1   KLOR-CON M20 20 MEQ tablet TAKE 1 TABLET BY MOUTH EVERY DAY 30 tablet 1   Lactulose 20 GM/30ML SOLN Take 30 ml by mouth every 3 hours until bowel movement is had; then continue taking 30 ml by mouth once daily 450 mL 2   lidocaine-prilocaine (EMLA) cream Apply a small amount to port a cath site and cover with plastic wrap 1 hour prior to chemotherapy appointments 30 g 3   magnesium oxide (MAG-OX) 400 MG tablet TAKE 1 TABLET BY MOUTH EVERY DAY 30 tablet 1   metoCLOPramide (REGLAN) 10 MG tablet Take 1 tablet (10 mg total) by mouth every 8 (eight) hours. 90 tablet 1   Misc. Devices  MISC Please provide patient with rollaider walker with cushioned seat. 1 each 0   Misc. Devices MISC Please provide patient with lift chair Dx: C34.90, C79.31 1 each 0   omeprazole (PRILOSEC) 40 MG capsule Take 40 mg by mouth daily.     oxyCODONE (OXY IR/ROXICODONE) 5 MG immediate release tablet Take 2 tablets (10 mg total) by mouth every 6 (six) hours as needed for severe pain. 240 tablet 0   PEMEtrexed 500 mg/m2 in sodium chloride 0.9 % 100 mL Inject 500 mg/m2 into the vein every 21 ( twenty-one) days.     promethazine (PHENERGAN) 25 MG suppository Place 1 suppository (25 mg total) rectally every 6 (six) hours as needed for nausea or vomiting. 12 each 0   sodium chloride 0.9 % SOLN 50 mL with pembrolizumab 100 MG/4ML SOLN 2 mg/kg Inject 200 mg into the vein every 21 ( twenty-one) days.     sucralfate (CARAFATE) 1 g tablet Take 1 tablet (1 g total) by mouth 4 (four) times daily -  with meals and at bedtime. 120 tablet 3   No current facility-administered medications for this visit.   Facility-Administered Medications Ordered in Other Visits  Medication Dose Route Frequency Provider Last Rate Last Admin   heparin lock flush 100 unit/mL  500 Units Intravenous Once [name], MD       sodium chloride flush (NS) 0.9 % injection 10 mL  10 mL Intravenous PRN [name], MD        ALLERGIES:  No Known Allergies  PHYSICAL EXAM:  Performance status (ECOG): 1 - Symptomatic but completely ambulatory  Vitals:   06/05/20 0754  BP: 106/79  Pulse: (!) 110  Resp: 18  Temp: 97.7 F (36.5 C)  SpO2: 99%   Wt Readings from Last 3 Encounters:  06/05/20 213 lb 12.8 oz (97 kg)  05/08/20 207 lb (93.9 kg)  05/01/20 210 lb 12.8 oz (95.6 kg)   Physical Exam Vitals reviewed.  Constitutional:  Appearance: Normal appearance.  Cardiovascular:     Rate and Rhythm: Normal rate and regular rhythm.     Pulses: Normal pulses.     Heart sounds: Normal heart sounds.   Pulmonary:     Effort: Pulmonary effort is normal.     Breath sounds: Normal breath sounds.  Chest:     Chest wall: Tenderness (R lower posterior & lateral ribs TTP) present.     Comments: Port-a-Cath in L chest Abdominal:     General: There is no distension.     Palpations: Abdomen is soft. There is no mass.     Tenderness: There is no abdominal tenderness.  Musculoskeletal:     Right lower leg: No edema.     Left lower leg: No edema.  Neurological:     General: No focal deficit present.     Mental Status: He is alert and oriented to person, place, and time.  Psychiatric:        Mood and Affect: Mood normal.        Behavior: Behavior normal.     LABORATORY DATA:  I have reviewed the labs as listed.  CBC Latest Ref Rng & Units 06/05/2020 05/15/2020 05/08/2020  WBC 4.0 - 10.5 K/uL 7.5 9.0 7.8  Hemoglobin 13.0 - 17.0 g/dL 10.3(L) 10.3(L) 10.9(L)  Hematocrit 39.0 - 52.0 % 32.5(L) 33.4(L) 33.9(L)  Platelets 150 - 400 K/uL 83(L) 74(L) 83(L)   CMP Latest Ref Rng & Units 06/05/2020 05/15/2020 05/08/2020  Glucose 70 - 99 mg/dL 173(H) 117(H) 112(H)  BUN 8 - 23 mg/dL '14 19 16  ' Creatinine 0.61 - 1.24 mg/dL 1.08 0.75 0.98  Sodium 135 - 145 mmol/L 130(L) 133(L) 132(L)  Potassium 3.5 - 5.1 mmol/L 3.9 4.2 4.2  Chloride 98 - 111 mmol/L 95(L) 99 96(L)  CO2 22 - 32 mmol/L '23 24 25  ' Calcium 8.9 - 10.3 mg/dL 8.3(L) 8.6(L) 8.4(L)  Total Protein 6.5 - 8.1 g/dL 6.7 6.9 6.6  Total Bilirubin 0.3 - 1.2 mg/dL 0.8 0.5 0.6  Alkaline Phos 38 - 126 U/L 158(H) 110 119  AST 15 - 41 U/L '30 21 29  ' ALT 0 - 44 U/L '25 23 24    ' DIAGNOSTIC IMAGING:  I have independently reviewed the scans and discussed with the patient. No results found.   ASSESSMENT:  1.  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 Jenetta Downer. -CT abdomen on 12/03/2019 showed posterior right lobe lesion measuring 2.1 x 1.9 cm and another anterior liver dome  lesion measuring 1.3 x 1.2 cm. Right adrenal mass measuring 3.3 x 1.8 cm. -MRI of the liver on 12/05/2019 showed rim-enhancing lesion of the right adrenal gland measuring 3.0 x 2.5 cm. There is rim-enhancing lesion of the superior pole of the left kidney measuring 1.9 x 1.7 cm. Right lobe of the liver lesion measuring 2.2 x 2.0 cm. Anterior liver dome lesion measuring 1.1 x 0.9 cm. -CT chest without contrast on 12/07/2019 showed 1.9 cm spiculated nodule in the right lung apex. Right paratracheal lymph node 1.3 cm. 1.5 cm lower right paratracheal node. -Right lobe of liver needle biopsy consistent with adenocarcinoma, positive for CK7, TTF-1. Negative for CK20, CDX2, Napsin a and CK 5/6. -PD-L1 TPS 1% -Foundation 1 with TMB high, MS-stable, no other targetable mutations. -PET scan on 01/21/2020 showed 2 cm nodule in the right lung apex, thoracic nodal metastasis. 2.5 cm hepatic meta stasis. Additional small hepatic lesions incompletely characterized.  Bilateral adrenal meta stasis. Multifocal bone metastasis throughout the axial and appendicular skeleton. -Cycle 1 of carboplatin, pemetrexed and pembrolizumab started on 01/17/2020. -PET scan on 03/17/2020 showing interval partial metabolic response with no new or progressive hypermetabolic metastatic disease.  2.  Prostate cancer: -Diagnosed in 2017, seed implants done in June 2018.  3.  Social/family history: -He drives a Teacher, English as a foreign language. Quit smoking in 1992, 2 packs/day for 32 years. -Brother had prostate cancer. Sister had skin cancer and mother had throat cancer.  4. Brain metastasis: -MRI of the brain on 12/28/2019 shows bilateral cerebellar metastasis measuring 6 to 7 mm. Enhancing 1.2 cm left clival metastasis. -SRS to the brain lesion on 01/25/2020.   PLAN:  1.  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  He had some bruising at that  time. -He developed right-sided lateral rib pain about 2 days ago.  He reports movement and deep breathing makes it worse.  There is tender area on the right lower lateral ribs.  No clear swelling. -Differential diagnosis includes pain from  liver metastasis. -We will hold off on treatment today as he is weak. -Reviewed his labs which show slightly elevated alk phos of 158.  CBC shows platelets are 83.  White count is normal.  Hemoglobin 10.3. -Will order rib series on the right side.  We will also make a referral to home health. -We will also order PET scan for restaging.  RTC after PET scan.  2. Brain metastasis: -Brain MRI on 05/02/2020 with more or less stable disease.  Repeat MRI in 2 months.  3. Generalized pains: -Continue oxycodone 10 mg every 6 hours as needed.  4. Uncontrolled nausea/vomiting: -Continue Reglan every 8 hours and Carafate every 4 hours as needed.  5. Hypomagnesemia: -Continue magnesium 1 tablet daily.  Magnesium is 1.7.   Orders placed this encounter:  Orders Placed This Encounter  Procedures   DG Ribs Unilateral Right   NM PET Image Restag (PS) Skull Base To Thigh     [name], MD [place] [phone]   I, Milinda Antis, am acting as a scribe for Dr. Sanda Linger.  I, [name] MD, have reviewed the above documentation for accuracy and completeness, and I agree with the above.

## 2020-06-10 ENCOUNTER — Telehealth (HOSPITAL_COMMUNITY): Payer: Self-pay

## 2020-06-10 NOTE — Telephone Encounter (Signed)
This nurse spoke with patients spouse who stated the patient is sleeping a lot, has increased complaints of pain, does not want to eat and is unable to reposition himself or bear his weight.  Would like to know are there any suggestions from MD.  Spouse also requesting referral for care assistance in her home as she is unable to care for patient physically. Advised spouse I will refer patient to social worker to see what type of care services can be offered to her.  Also advised MD has been made aware of patients complaints, awaiting a response and this nurse will return call with any new orders or suggestions.  No further questions or concerns stated at this time.

## 2020-06-11 ENCOUNTER — Encounter (HOSPITAL_COMMUNITY): Payer: Self-pay

## 2020-06-11 ENCOUNTER — Other Ambulatory Visit (HOSPITAL_COMMUNITY): Payer: Self-pay

## 2020-06-11 MED ORDER — MISC. DEVICES MISC: 0 refills | Status: AC

## 2020-06-11 NOTE — Progress Notes (Signed)
Patient's wife inquired about household support from [place]. Phone call placed to [employer] at her request and advised that they are a payroll company that assists medicaid patients only in covering household assistance that the patient/family hires. Patient's wife made aware. I discussed the option of home health to support for in home care and they wife is adamant that she is not interested in that. MD aware

## 2020-06-12 ENCOUNTER — Other Ambulatory Visit: Payer: Self-pay

## 2020-06-12 ENCOUNTER — Ambulatory Visit (HOSPITAL_COMMUNITY)
Admission: RE | Admit: 2020-06-12 | Discharge: 2020-06-12 | Disposition: A | Discharge status: Home / Self Care | Payer: Medicare Other | Source: Ambulatory Visit | Attending: Hematology | Admitting: Hematology

## 2020-06-12 DIAGNOSIS — C787 Secondary malignant neoplasm of liver and intrahepatic bile duct: Secondary | ICD-10-CM | POA: Insufficient documentation

## 2020-06-12 DIAGNOSIS — C3491 Malignant neoplasm of unspecified part of right bronchus or lung: Secondary | ICD-10-CM | POA: Insufficient documentation

## 2020-06-12 MED ORDER — FLUDEOXYGLUCOSE F - 18 (FDG) INJECTION: 10.7000 | Freq: Once | INTRAVENOUS | Status: DC | PRN

## 2020-06-13 LAB — GLUCOSE, CAPILLARY: Glucose-Capillary: 140 mg/dL — ABNORMAL HIGH (ref 70–99)

## 2020-06-16 ENCOUNTER — Other Ambulatory Visit: Payer: Self-pay

## 2020-06-16 ENCOUNTER — Inpatient Hospital Stay (HOSPITAL_BASED_OUTPATIENT_CLINIC_OR_DEPARTMENT_OTHER): Payer: Medicare Other | Admitting: Hematology

## 2020-06-16 ENCOUNTER — Telehealth (HOSPITAL_COMMUNITY): Payer: Self-pay

## 2020-06-16 ENCOUNTER — Inpatient Hospital Stay (HOSPITAL_COMMUNITY): Payer: Medicare Other | Attending: Hematology and Oncology

## 2020-06-16 ENCOUNTER — Inpatient Hospital Stay (HOSPITAL_COMMUNITY): Payer: Medicare Other

## 2020-06-16 ENCOUNTER — Encounter (HOSPITAL_COMMUNITY): Payer: Self-pay | Admitting: Hematology

## 2020-06-16 VITALS — BP 148/94 | HR 108 | Temp 97.3°F | Resp 20 | Wt 210.5 lb

## 2020-06-16 DIAGNOSIS — C7971 Secondary malignant neoplasm of right adrenal gland: Secondary | ICD-10-CM | POA: Diagnosis not present

## 2020-06-16 DIAGNOSIS — C61 Malignant neoplasm of prostate: Secondary | ICD-10-CM | POA: Insufficient documentation

## 2020-06-16 DIAGNOSIS — C771 Secondary and unspecified malignant neoplasm of intrathoracic lymph nodes: Secondary | ICD-10-CM | POA: Insufficient documentation

## 2020-06-16 DIAGNOSIS — C3491 Malignant neoplasm of unspecified part of right bronchus or lung: Secondary | ICD-10-CM | POA: Diagnosis present

## 2020-06-16 DIAGNOSIS — C787 Secondary malignant neoplasm of liver and intrahepatic bile duct: Secondary | ICD-10-CM | POA: Insufficient documentation

## 2020-06-16 DIAGNOSIS — R531 Weakness: Secondary | ICD-10-CM | POA: Diagnosis not present

## 2020-06-16 DIAGNOSIS — C7972 Secondary malignant neoplasm of left adrenal gland: Secondary | ICD-10-CM | POA: Diagnosis not present

## 2020-06-16 DIAGNOSIS — R112 Nausea with vomiting, unspecified: Secondary | ICD-10-CM | POA: Insufficient documentation

## 2020-06-16 DIAGNOSIS — Z79899 Other long term (current) drug therapy: Secondary | ICD-10-CM | POA: Insufficient documentation

## 2020-06-16 DIAGNOSIS — R52 Pain, unspecified: Secondary | ICD-10-CM | POA: Insufficient documentation

## 2020-06-16 DIAGNOSIS — Z87891 Personal history of nicotine dependence: Secondary | ICD-10-CM | POA: Insufficient documentation

## 2020-06-16 DIAGNOSIS — C7951 Secondary malignant neoplasm of bone: Secondary | ICD-10-CM | POA: Diagnosis not present

## 2020-06-16 LAB — CBC WITH DIFFERENTIAL/PLATELET
Abs Immature Granulocytes: 0.13 10*3/uL — ABNORMAL HIGH (ref 0.00–0.07)
Basophils Absolute: 0.1 10*3/uL (ref 0.0–0.1)
Basophils Relative: 1 %
Eosinophils Absolute: 0 10*3/uL (ref 0.0–0.5)
Eosinophils Relative: 1 %
HCT: 32.8 % — ABNORMAL LOW (ref 39.0–52.0)
Hemoglobin: 10 g/dL — ABNORMAL LOW (ref 13.0–17.0)
Immature Granulocytes: 2 %
Lymphocytes Relative: 22 %
Lymphs Abs: 1.5 10*3/uL (ref 0.7–4.0)
MCH: 31.9 pg (ref 26.0–34.0)
MCHC: 30.5 g/dL (ref 30.0–36.0)
MCV: 104.8 fL — ABNORMAL HIGH (ref 80.0–100.0)
Monocytes Absolute: 1.3 10*3/uL — ABNORMAL HIGH (ref 0.1–1.0)
Monocytes Relative: 19 %
Neutro Abs: 3.8 10*3/uL (ref 1.7–7.7)
Neutrophils Relative %: 55 %
Platelets: 95 10*3/uL — ABNORMAL LOW (ref 150–400)
RBC: 3.13 MIL/uL — ABNORMAL LOW (ref 4.22–5.81)
RDW: 16 % — ABNORMAL HIGH (ref 11.5–15.5)
WBC: 6.8 10*3/uL (ref 4.0–10.5)
nRBC: 0.4 % — ABNORMAL HIGH (ref 0.0–0.2)

## 2020-06-16 LAB — COMPREHENSIVE METABOLIC PANEL
ALT: 18 U/L (ref 0–44)
AST: 38 U/L (ref 15–41)
Albumin: 2.2 g/dL — ABNORMAL LOW (ref 3.5–5.0)
Alkaline Phosphatase: 176 U/L — ABNORMAL HIGH (ref 38–126)
Anion gap: 11 (ref 5–15)
BUN: 10 mg/dL (ref 8–23)
CO2: 25 mmol/L (ref 22–32)
Calcium: 8.2 mg/dL — ABNORMAL LOW (ref 8.9–10.3)
Chloride: 100 mmol/L (ref 98–111)
Creatinine, Ser: 0.98 mg/dL (ref 0.61–1.24)
GFR, Estimated: >60 mL/min (ref >60)
Glucose, Bld: 147 mg/dL — ABNORMAL HIGH (ref 70–99)
Potassium: 4.5 mmol/L (ref 3.5–5.1)
Sodium: 136 mmol/L (ref 135–145)
Total Bilirubin: 0.9 mg/dL (ref 0.3–1.2)
Total Protein: 6.2 g/dL — ABNORMAL LOW (ref 6.5–8.1)

## 2020-06-16 LAB — MAGNESIUM: Magnesium: 2 mg/dL (ref 1.7–2.4)

## 2020-06-16 LAB — TSH: TSH: 5.337 u[IU]/mL — ABNORMAL HIGH (ref 0.350–4.500)

## 2020-06-16 MED ORDER — SODIUM CHLORIDE 0.9% FLUSH
10.0000 mL | Freq: Once | INTRAVENOUS | Status: AC
  Administered 2020-06-16: 10 mL

## 2020-06-16 MED ORDER — HYDROMORPHONE HCL 2 MG PO TABS
2.0000 mg | ORAL_TABLET | ORAL | 0 refills | Status: AC | PRN
Start: 2020-06-16 — End: ?

## 2020-06-16 MED ORDER — FENTANYL 50 MCG/HR TD PT72: 1.0000 | MEDICATED_PATCH | TRANSDERMAL | 0 refills | Status: AC

## 2020-06-16 MED ORDER — HEPARIN SOD (PORK) LOCK FLUSH 100 UNIT/ML IV SOLN
500.0000 [IU] | Freq: Once | INTRAVENOUS | Status: AC
  Administered 2020-06-16: 500 [IU] via INTRAVENOUS

## 2020-06-16 NOTE — Progress Notes (Signed)
Patient was assessed by Dr. Delton Coombes and labs have been reviewed. No treatment today. Dr. Delton Coombes is discontinuing treatment. Primary RN and pharmacy aware.

## 2020-06-16 NOTE — Telephone Encounter (Signed)
This nurse received a call from [place] at [facility].  Wanted to verify that PET results were received.  This nurse informed that results have been received and MD made aware.  No further questions or concerns at this time.

## 2020-06-16 NOTE — Progress Notes (Signed)
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  PRIOR THERAPY: SRS on 01/31/2020  NGS Results: PD-L1 TPS 1%, Foundation 1 MS--stable, TMB 38 Muts/Mb  CURRENT THERAPY: Keytruda & pemetrexed every 3 weeks  BRIEF ONCOLOGIC HISTORY:  Oncology History  Malignant neoplasm of lung ([place])  12/20/2019 Initial Diagnosis   Adenocarcinoma of lung, stage 4, right ([place])   12/20/2019 Cancer Staging   Staging form: Lung, AJCC 8th Edition - Clinical: Stage IVB (cT1b, cN2, pM1c) - Signed by [name], MD on 12/20/2019   12/25/2019 Genetic Testing   PDL1     12/31/2019 [place] [facility]     01/17/2020 -  Chemotherapy    Patient is on Treatment Plan: LUNG CARBOPLATIN / PEMETREXED / PEMBROLIZUMAB Q21D INDUCTION X 4 CYCLES / MAINTENANCE PEMETREXED + PEMBROLIZUMAB        CANCER STAGING: Cancer Staging Malignant neoplasm of lung ([place]) Staging form: Lung, AJCC 8th Edition - Clinical: Stage IVB (cT1b, cN2, pM1c) - Signed by [name], MD on 12/20/2019   INTERVAL HISTORY:  [name], a 75 y.o. male, returns for routine follow-up and consideration for next cycle of chemotherapy. Lance Stafford was last seen on 06/05/2020.  Due for cycle #7 of Keytruda and pemetrexed today.   Today he is accompanied by his wife. Overall, he tells me he has been feeling poorly. He complains of having pain from head to toe. His pain is not controlled and he is not able to sleep due to the pain; he is taking oxycodone 10 mg every 6 hours. He does not like Boost/Ensure since he would vomit it up. He is not able to get himself up on his own. He ate some applesauce this AM. He was taking lactulose as needed but it was not helping so he received a suppository which  produced a BM. He spends the entire day sleeping or in bed. His legs are elevated but they still stay swollen.  He has a bedside commode, but he now requires a hospital bed at home since he is not able to get comfortable in his regular bed and his back pain is better relieved when his back is elevated. He is too weak and is not able to raise himself from the bed.  His treatment will be stopped due to progression of his cancer and he will be referred to hospice.   REVIEW OF SYSTEMS:  Review of Systems  Constitutional: Positive for appetite change (depleted) and fatigue (depleted).  Respiratory: Positive for shortness of breath.   Cardiovascular: Positive for leg swelling.  Gastrointestinal: Positive for constipation, nausea and vomiting.  Musculoskeletal: Positive for arthralgias (9/10 R shoulder) and back pain (9/10 lower back pain radiating to legs).  Neurological: Positive for dizziness, headaches and numbness (tingling in hands & feet).  Psychiatric/Behavioral: Positive for confusion and depression. The patient is nervous/anxious.   All other systems reviewed and are negative.   PAST MEDICAL/SURGICAL HISTORY:  Past Medical History:  Diagnosis Date  . Arthritis   . Chronic low back pain    truck driver  . GERD (gastroesophageal reflux disease)   . Hyperlipidemia   . Lung cancer ([place])    stage IV non small cell lung ca  . Nocturia   . Port-A-Cath in place  01/10/2020  . Prostate cancer ([place]) UROLOGIST-  DR WRENN/  ONCOLOGIST-  DR MANNING   dx 02/ 2017via TRUSPbx---  Stage T1c,  Gleason 3+3,  PSA 11.9  . Wears glasses   . Wears partial dentures    upper and lower   Past Surgical History:  Procedure Laterality Date  . CATARACT EXTRACTION W/ INTRAOCULAR LENS  IMPLANT, BILATERAL  2015  . CHOLECYSTECTOMY    . CYSTOSCOPY  09/30/2016   Procedure: CYSTOSCOPY;  Surgeon: Irine Seal, MD;  Location: [facility];  Service: Urology;;  no seeds found in bladder  . PORTACATH  PLACEMENT Left 01/14/2020   Procedure: INSERTION PORT-A-CATH;  Surgeon: Aviva Signs, MD;  Location: [facility];  Service: General;  Laterality: Left;  . RADIOACTIVE SEED IMPLANT N/A 09/30/2016   Procedure: RADIOACTIVE SEED IMPLANT/BRACHYTHERAPY IMPLANT, SPACE OAR;  Surgeon: Irine Seal, MD;  Location[facility];  Service: Urology;  Laterality: N/A;  70 seeds implanted  . SPERMATOCELECTOMY Left 01/30/2015   Procedure: SPERMATOCELECTOMY;  Surgeon: Irine Seal, MD;  Location: [facility];  Service: Urology;  Laterality: Left;    SOCIAL HISTORY:  Social History   Socioeconomic History  . Marital status: Married    Spouse name: Not on file  . Number of children: 2  . Years of education: Not on file  . Highest education level: Not on file  Occupational History  . Occupation: truck Geophysicist/field seismologist  Tobacco Use  . Smoking status: Former Smoker    Packs/day: 2.00    Years: 33.00    Pack years: 66.00    Types: Cigarettes    Quit date: 01/23/1991    Years since quitting: 29.4  . Smokeless tobacco: Never Used  Vaping Use  . Vaping Use: Never used  Substance and Sexual Activity  . Alcohol use: No  . Drug use: No  . Sexual activity: Yes  Other Topics Concern  . Not on file  Social History Narrative  . Not on file   Social Determinants of Health   Financial Resource Strain: Low Risk   . Difficulty of Paying Living Expenses: Not hard at all  Food Insecurity: No Food Insecurity  . Worried About Charity fundraiser in the Last Year: Never true  . Ran Out of Food in the Last Year: Never true  Transportation Needs: No Transportation Needs  . Lack of Transportation (Medical): No  . Lack of Transportation (Non-Medical): No  Physical Activity: Sufficiently Active  . Days of Exercise per Week: 1 day  . Minutes of Exercise per Session: 150+ min  Stress: No Stress Concern Present  . Feeling of Stress : Only a little  Social Connections: Socially Integrated  . Frequency  of Communication with Friends and Family: More than three times a week  . Frequency of Social Gatherings with Friends and Family: Three times a week  . Attends Religious Services: More than 4 times per year  . Active Member of Clubs or Organizations: Yes  . Attends Archivist Meetings: More than 4 times per year  . Marital Status: Married  Human resources officer Violence: Not At Risk  . Fear of Current or Ex-Partner: No  . Emotionally Abused: No  . Physically Abused: No  . Sexually Abused: No    FAMILY HISTORY:  Family History  Problem Relation Age of Onset  . Diabetes Mother   . Stroke Mother   . Throat cancer Mother   . Skin cancer Sister   . Prostate cancer  Brother   . Cancer Paternal Grandmother   . Clotting disorder Daughter     CURRENT MEDICATIONS:  Current Outpatient Medications  Medication Sig Dispense Refill  . fentaNYL (DURAGESIC) 50 MCG/HR Place 1 patch onto the skin every 3 (three) days. 5 patch 0  . HYDROmorphone (DILAUDID) 2 MG tablet Take 1 tablet (2 mg total) by mouth every 3 (three) hours as needed for severe pain. 60 tablet 0  . aspirin EC 81 MG tablet Take 81 mg by mouth daily.     Marland Kitchen atorvastatin (LIPITOR) 40 MG tablet Take 1 tablet (40 mg total) by mouth daily. 30 tablet 3  . CARBOPLATIN IV Inject into the vein every 21 ( twenty-one) days.    Marland Kitchen dexamethasone (DECADRON) 2 MG tablet Take 1 tablet (2 mg total) by mouth daily. 10 tablet 0  . diltiazem (CARDIZEM CD) 120 MG 24 hr capsule Take 1 capsule (120 mg total) by mouth daily. 30 capsule 11  . docusate sodium (COLACE) 100 MG capsule Take 200 mg by mouth daily.    . folic acid (FOLVITE) 1 MG tablet Take 1 tablet (1 mg total) by mouth daily. 90 tablet 1  . KLOR-CON M20 20 MEQ tablet TAKE 1 TABLET BY MOUTH EVERY DAY 30 tablet 1  . Lactulose 20 GM/30ML SOLN Take 30 ml by mouth every 3 hours until bowel movement is had; then continue taking 30 ml by mouth once daily 450 mL 2  . lidocaine-prilocaine (EMLA)  cream Apply a small amount to port a cath site and cover with plastic wrap 1 hour prior to chemotherapy appointments 30 g 3  . magnesium oxide (MAG-OX) 400 MG tablet TAKE 1 TABLET BY MOUTH EVERY DAY 30 tablet 1  . metoCLOPramide (REGLAN) 10 MG tablet Take 1 tablet (10 mg total) by mouth every 8 (eight) hours. 90 tablet 1  . Misc. Devices MISC Please provide patient with rollaider walker with cushioned seat. 1 each 0  . Misc. Devices MISC Please provide patient with lift chair Dx: C34.90, C79.31 1 each 0  . Misc. Devices MISC Please provide patient with semi-electric hospital bed 1 each 0  . omeprazole (PRILOSEC) 40 MG capsule Take 40 mg by mouth daily.    Marland Kitchen oxyCODONE (OXY IR/ROXICODONE) 5 MG immediate release tablet Take 2 tablets (10 mg total) by mouth every 6 (six) hours as needed for severe pain. 240 tablet 0  . PEMEtrexed 500 mg/m2 in sodium chloride 0.9 % 100 mL Inject 500 mg/m2 into the vein every 21 ( twenty-one) days.    . promethazine (PHENERGAN) 25 MG suppository Place 1 suppository (25 mg total) rectally every 6 (six) hours as needed for nausea or vomiting. 12 each 0  . sodium chloride 0.9 % SOLN 50 mL with pembrolizumab 100 MG/4ML SOLN 2 mg/kg Inject 200 mg into the vein every 21 ( twenty-one) days.    . sucralfate (CARAFATE) 1 g tablet Take 1 tablet (1 g total) by mouth 4 (four) times daily -  with meals and at bedtime. 120 tablet 3   No current facility-administered medications for this visit.   Facility-Administered Medications Ordered in Other Visits  Medication Dose Route Frequency Provider Last Rate Last Admin  . fludeoxyglucose F - 18 (FDG) injection 22.0 millicurie  25.4 millicurie Intravenous Once PRN Sherryl Barters, MD        ALLERGIES:  No Known Allergies  PHYSICAL EXAM:  Performance status (ECOG): 1 - Symptomatic but completely ambulatory  Vitals:   06/16/20 0844  BP: (!) 148/94  Pulse: (!) 108  Resp: 20  Temp: (!) 97.3 F (36.3 C)  SpO2: 91%   Wt Readings  from Last 3 Encounters:  06/16/20 210 lb 8.6 oz (95.5 kg)  06/05/20 213 lb 12.8 oz (97 kg)  05/08/20 207 lb (93.9 kg)   Physical Exam Vitals reviewed.  Constitutional:      Appearance: Normal appearance.  Chest:     Comments: Port-a-Cath in L chest Abdominal:     Palpations: Abdomen is soft.     Tenderness: There is abdominal tenderness.  Musculoskeletal:     Right lower leg: Edema (2+) present.     Left lower leg: Edema (2+) present.  Neurological:     General: No focal deficit present.     Mental Status: He is alert and oriented to person, place, and time.  Psychiatric:        Mood and Affect: Mood normal.        Behavior: Behavior normal.     LABORATORY DATA:  I have reviewed the labs as listed.  CBC Latest Ref Rng & Units 06/16/2020 06/05/2020 05/15/2020  WBC 4.0 - 10.5 K/uL 6.8 7.5 9.0  Hemoglobin 13.0 - 17.0 g/dL 10.0(L) 10.3(L) 10.3(L)  Hematocrit 39.0 - 52.0 % 32.8(L) 32.5(L) 33.4(L)  Platelets 150 - 400 K/uL 95(L) 83(L) 74(L)   CMP Latest Ref Rng & Units 06/16/2020 06/05/2020 05/15/2020  Glucose 70 - 99 mg/dL 147(H) 173(H) 117(H)  BUN 8 - 23 mg/dL _0 Creatinine 0.61 - 1.24 mg/dL 0.98 1.08 0.75  Sodium 135 - 145 mmol/L 136 130(L) 133(L)  Potassium 3.5 - 5.1 mmol/L 4.5 3.9 4.2  Chloride 98 - 111 mmol/L 100 95(L) 99  CO2 22 - 32 mmol/L _1 Calcium 8.9 - 10.3 mg/dL 8.2(L) 8.3(L) 8.6(L)  Total Protein 6.5 - 8.1 g/dL 6.2(L) 6.7 6.9  Total Bilirubin 0.3 - 1.2 mg/dL 0.9 0.8 0.5  Alkaline Phos 38 - 126 U/L 176(H) 158(H) 110  AST 15 - 41 U/L 38 30 21  ALT 0 - 44 U/L _2 DIAGNOSTIC IMAGING:  I have independently reviewed the scans and discussed with the patient. DG Ribs Unilateral Right  Result Date: 06/06/2020 CLINICAL DATA:  Chest pain.  Reported lung carcinoma EXAM: RIGHT RIBS - 2 VIEW COMPARISON:  Chest radiograph March 02, 2020; PET-CT March 17, 2020 FINDINGS: Frontal and oblique views obtained. There is ill-defined opacity in the right upper  lobe region. Apical pleural thickening bilaterally right lung otherwise clear. There is no right-sided pneumothorax or pleural effusion. No appreciable rib fracture. No blastic or lytic bone lesions apparent. Port-A-Cath tip in superior vena cava. Old healed fracture right clavicle. Bony overgrowth along the inferior right clavicle laterally. IMPRESSION: No acute rib fracture. No blastic or lytic bone lesions. Old healed fracture right clavicle midportion. Bony overgrowth along the inferolateral right clavicle potentially places patient at increased risk for impingement syndrome. Apical pleural thickening bilaterally with ill-defined opacity right upper lobe. Neoplastic involvement in the right upper lobe questioned given clinical history in appearance on prior PET study. Electronically Signed   By: Lowella Grip III M.D.   On: 06/06/2020 14:52   NM PET Image Restag (PS) Skull Base To Thigh  Result Date: 06/13/2020 CLINICAL DATA:  Subsequent treatment strategy for stage IV non-small lung cancer. EXAM: NUCLEAR MEDICINE PET SKULL BASE TO THIGH TECHNIQUE: 10.6 mCi F-18 FDG was injected intravenously. Full-ring PET imaging was performed from the skull  base to thigh after the radiotracer. CT data was obtained and used for attenuation correction and anatomic localization. Fasting blood glucose: 140 mg/dl COMPARISON:  PET-CT scan 03/17/2020 FINDINGS: Mediastinal blood pool activity: SUV max 2.7 Liver activity: SUV max NA NECK: No hypermetabolic lymph nodes in the neck. Incidental CT findings: none CHEST: RIGHT upper lobe pulmonary nodules increased in size measuring 2.4 cm increased from 1.5 cm and with increased metabolic activity SUV max equal 16.8 increased from 4.7. Hypermetabolic RIGHT hilar activity is increased with SUV max equal 14.3 increased from 4.5. New hypermetabolic RIGHT lower paratracheal adenopathy. Incidental CT findings: none ABDOMEN/PELVIS: Marked progression of hepatic metastasis. Multiple new  hypermetabolic lesions within the LEFT and RIGHT hepatic lobe. There approximately 10 lesions in liver where previously there was 1 residual lesion. Example new lesion in the anterior aspect of the central RIGHT hepatic lobe with SUV max equal 15.4. These lesions are evident on the noncontrast CT as hypodense lesions. Above described lesion measures 2.4 cm on image 96/4. Progressive adrenal metastasis. RIGHT adrenal gland with SUV max equal 10.9 is increased from 6.2. New metabolic activity of the LEFT adrenal gland with SUV max equal 9.1. The LEFT adrenal gland measures 17 mm in thickness compared to 13 mm. There several small hypermetabolic periaortic retroperitoneal lymph nodes increased from prior. Incidental CT findings: Brachytherapy seeds in the prostate gland SKELETON: Progressive skeletal metastasis. For example lesion the posterior RIGHT iliac bone with SUV max equal 11.0 is increased from 4.5. There is sclerosis on the CT portion exam not changed from prior. Near entirety of the central sacrum is involved with SUV max equal 10.0. This is new from prior. Multiple lesions throughout the spine. Increase in size and metabolic activity associated with the RIGHT lateral second rib with SUV max equal 15.2 increased from SUV max equal 3.2. On the whole-body 3D MIP imaging there are numeral a skeletal metastasis. Incidental CT findings: none IMPRESSION: 1. Unfortunately there is evidence of widespread lung cancer metastatic disease progression. 2. Interval increase in size and metabolic activity of RIGHT upper lobe pulmonary nodule. 3. Increase in hypermetabolic RIGHT hilar and mediastinal lymphadenopathy. 4. Multiple new hypermetabolic hepatic metastasis. 5. New and increased adrenal metastasis. 6. Multiple new hypermetabolic skeletal metastasis involving the pelvis, spine, and ribs. Electronically Signed   By: Suzy Bouchard M.D.   On: 06/13/2020 15:23     ASSESSMENT:  1.  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 Jenetta Downer. -CT abdomen on 12/03/2019 showed posterior right lobe lesion measuring 2.1 x 1.9 cm and another anterior liver dome lesion measuring 1.3 x 1.2 cm. Right adrenal mass measuring 3.3 x 1.8 cm. -MRI of the liver on 12/05/2019 showed rim-enhancing lesion of the right adrenal gland measuring 3.0 x 2.5 cm. There is rim-enhancing lesion of the superior pole of the left kidney measuring 1.9 x 1.7 cm. Right lobe of the liver lesion measuring 2.2 x 2.0 cm. Anterior liver dome lesion measuring 1.1 x 0.9 cm. -CT chest without contrast on 12/07/2019 showed 1.9 cm spiculated nodule in the right lung apex. Right paratracheal lymph node 1.3 cm. 1.5 cm lower right paratracheal node. -Right lobe of liver needle biopsy consistent with adenocarcinoma, positive for CK7, TTF-1. Negative for CK20, CDX2, Napsin a and CK 5/6. -PD-L1 TPS 1% -Foundation 1 with TMB high, MS-stable, no other targetable mutations. -PET scan on 01/21/2020 showed 2 cm nodule in the right lung apex, thoracic  nodal metastasis. 2.5 cm hepatic meta stasis. Additional small hepatic lesions incompletely characterized. Bilateral adrenal meta stasis. Multifocal bone metastasis throughout the axial and appendicular skeleton. -Cycle 1 of carboplatin, pemetrexed and pembrolizumab started on 01/17/2020. -PET scan on 03/17/2020 showing interval partial metabolic response with no new or progressive hypermetabolic metastatic disease.  2.  Prostate cancer: -Diagnosed in 2017, seed implants done in June 2018.  3.  Social/family history: -He drives a Teacher, English as a foreign language. Quit smoking in 1992, 2 packs/day for 32 years. -Brother had prostate cancer. Sister had skin cancer and mother had throat cancer.  4. Brain metastasis: -MRI of the brain on 12/28/2019 shows bilateral cerebellar metastasis measuring 6 to 7 mm. Enhancing 1.2 cm left clival metastasis. -SRS to  the brain lesion on 01/25/2020.   PLAN:  1.  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 -There is worsening of disease in the spine and other bones and in the liver. -His functional status has declined significantly. -Reviewed his labs which showed elevated alkaline phosphatase and decreased albumin of 2.2.  Platelet count is 95. -I have talked to him about best supportive care in the form of hospice.  I do not believe second line chemotherapy will help. -He is agreeable.  We will make a referral to [facility]. -I will start deprescribing some of his medications including Lipitor, aspirin, folic acid, potassium, magnesium. -RTC as needed.  2. Brain metastasis: -Brain MRI on 05/02/2020 with more or less stable disease.  We will discontinue upcoming MRI as he is going on hospice.  3. Generalized pains: -He is taking oxycodone every 6 hours.  The pain is not controlled well. -Discontinue oxycodone. -Start him on fentanyl 50 mcg patch.  Dilaudid 2 mg every 3 hours as needed for breakthrough pain.  4. Uncontrolled nausea/vomiting: -Continue Reglan every 8 hours and Carafate every 4 hours as needed.  5. Hypomagnesemia: -Magnesium today is 2.0.  Discontinue magnesium.   Orders placed this encounter:  No orders of the defined types were placed in this encounter.    [name], MD [place] [phone]   I, Milinda Antis, am acting as a scribe for Dr. Sanda Linger.  I, [name] MD, have reviewed the above documentation for accuracy and completeness, and I agree with the above.

## 2020-06-16 NOTE — Progress Notes (Signed)
Patient presents today for treatment and follow up with Dr. Delton Coombes. Platelets today 95.   Message received from [place] LPN/ Dr. Delton Coombes. No treatment today . Dr. Delton Coombes is discontinuing treatment per message.    Discharged from clinic via wheel chair in stable condition. Alert and oriented x 3. F/U with [facility] as scheduled. Accompanied to vehicle by CNA.

## 2020-06-16 NOTE — Patient Instructions (Signed)
[place] at [facility] Discharge Instructions  You were seen today by Dr. Delton Coombes. He went over your recent results and scans. Your treatment will be stopped due to progression of your cancer. You will be referred to hospice for comfort measures at home. You may stop taking Lipitor, oxycodone, aspirin, folic acid, potassium and magnesium. You will be prescribed fentanyl patches to apply on your skin and keep on for 3 days; you will also be prescribed Dilaudid to take every 3 hours only as needed for breakthrough pain. Dr. Delton Coombes will see you back as needed.   Thank you for choosing [place] at [facility] to provide your oncology and hematology care.  To afford each patient quality time with our provider, please arrive at least 15 minutes before your scheduled appointment time.   If you have a lab appointment with the [facility] please come in thru the Main Entrance and check in at the main information desk  You need to re-schedule your appointment should you arrive 10 or more minutes late.  We strive to give you quality time with our providers, and arriving late affects you and other patients whose appointments are after yours.  Also, if you no show three or more times for appointments you may be dismissed from the clinic at the providers discretion.     Again, thank you for choosing [facility].  Our hope is that these requests will decrease the amount of time that you wait before being seen by our physicians.       _____________________________________________________________  Should you have questions after your visit to [facility], please contact our office at [phone] between the hours of 8:00 a.m. and 4:30 p.m.  Voicemails left after 4:00 p.m. will not be returned until the following business day.  For prescription refill requests, have your pharmacy contact our office and allow 72 hours.    Cancer  Center Support Programs:   > Cancer Support Group  2nd Tuesday of the month 1pm-2pm, Journey Room

## 2020-06-18 ENCOUNTER — Telehealth: Payer: Self-pay

## 2020-06-18 NOTE — Telephone Encounter (Signed)
Called patient to review meaningful use questions for MRI results with Freeman Caldron PA on 06/25/20 @ 10:30am. Was informed by patient's niece that patient has been referred to hospice. TM

## 2020-06-20 ENCOUNTER — Encounter (HOSPITAL_COMMUNITY): Payer: Medicare Other

## 2020-06-20 ENCOUNTER — Other Ambulatory Visit: Payer: Medicare Other

## 2020-06-25 ENCOUNTER — Other Ambulatory Visit (HOSPITAL_COMMUNITY): Payer: Self-pay | Admitting: Hematology

## 2020-06-25 ENCOUNTER — Inpatient Hospital Stay: Admission: RE | Admit: 2020-06-25 | Payer: Medicare Other | Source: Ambulatory Visit | Admitting: Urology

## 2020-06-25 ENCOUNTER — Other Ambulatory Visit: Payer: Medicare Other

## 2020-07-01 ENCOUNTER — Other Ambulatory Visit: Payer: Medicare Other

## 2020-07-03 ENCOUNTER — Ambulatory Visit: Payer: Self-pay | Admitting: Urology

## 2020-07-11 DEATH — deceased

## 2020-09-25 ENCOUNTER — Ambulatory Visit: Payer: Medicare Other | Admitting: Urology

## 2021-06-24 IMAGING — MR MR HEAD WO/W CM
12 series · 48 of 48 positions shown · IV contrast (20 MULTIHANCE)
Comparison: MRI 01/24/2020.

CLINICAL DATA: Brain/CNS neoplasm, surveillance. Follow-up treated
metastatic disease.

EXAM:
MRI HEAD WITHOUT AND WITH CONTRAST
TECHNIQUE: Multiplanar, multiecho pulse sequences of the brain and surrounding
structures were obtained without and with intravenous contrast.
CONTRAST:  20mL MULTIHANCE GADOBENATE DIMEGLUMINE 529 MG/ML IV SOLN

[Series 2: FLAIR · sagittal · 3.0mm · 0.94mm/px · 3 of 39 slices shown (1 of 2)]
[im 1/39]
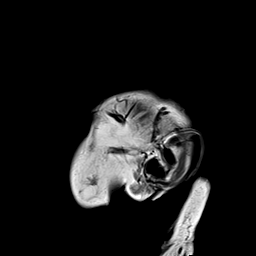
[im 20/39]
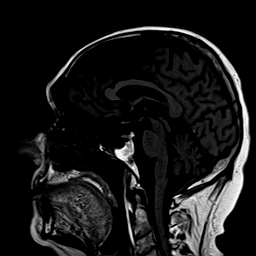
[im 39/39]
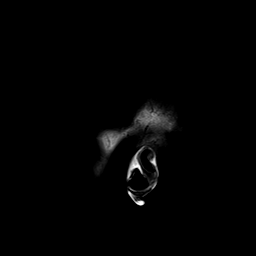

[Series 3: DWI · axial · 3.0mm · 1.50mm/px · z∈[-89,+82]mm · 5 of 90 slices shown (1 of 2)]
[im 1/90]
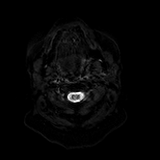
[im 23/90]
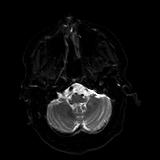
[im 45/90]
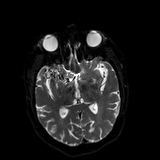
[im 67/90]
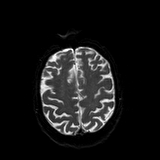
[im 90/90]
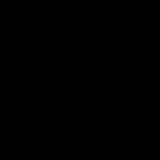

[Series 4: DWI · axial · 3.0mm · 1.50mm/px · z∈[-89,+82]mm · 2 of 44 slices shown (2 of 2)]
[im 1/44]
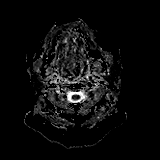
[im 44/44]
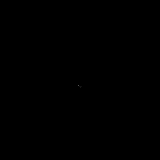

[Series 5: T2 · axial · 5.0mm · 1.02mm/px · 1 of 30 slices shown (1 of 2)]
[im 1/30]
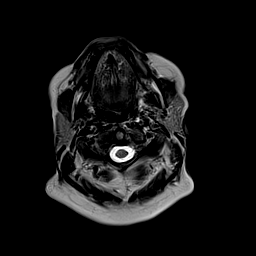

[Series 7: swi_images · axial · 1.5mm · 1.02mm/px · z∈[-86,+81]mm · 5 of 112 slices shown]
[im 1/112]
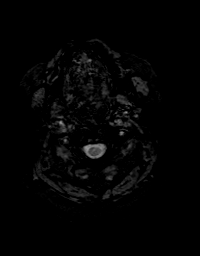
[im 28/112]
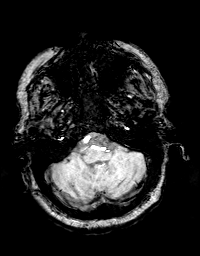
[im 56/112]
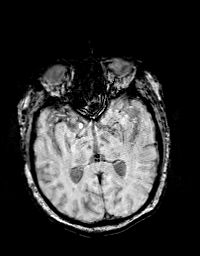
[im 84/112]
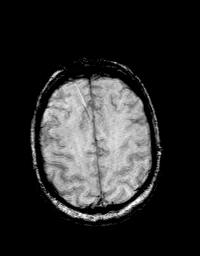
[im 112/112]
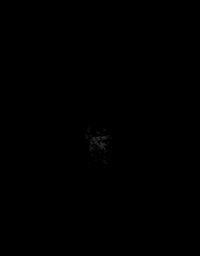

[Series 8: FLAIR · axial · 3.0mm · 1.02mm/px · z∈[-90,+76]mm · 2 of 56 slices shown (2 of 2)]
[im 1/56]
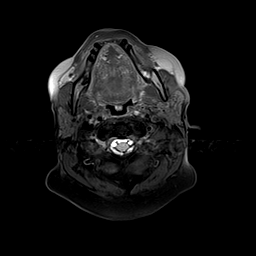
[im 56/56]
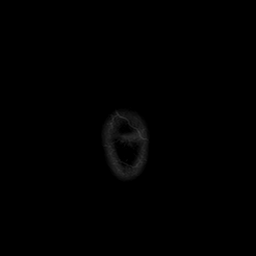

[Series 9: T2 · axial · non-contrast · 1.0mm · 1.02mm/px · z∈[-101,+87]mm · 8 of 192 slices shown (2 of 2)]
[im 1/192]
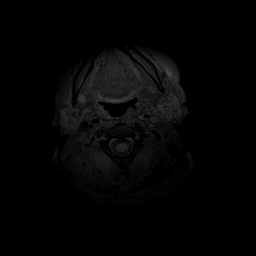
[im 28/192]
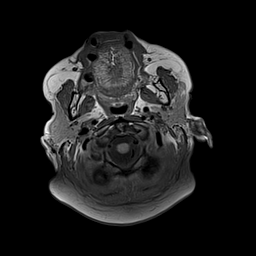
[im 55/192]
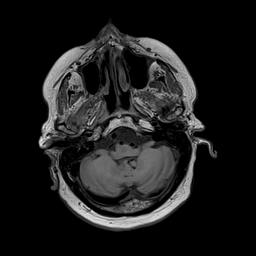
[im 82/192]
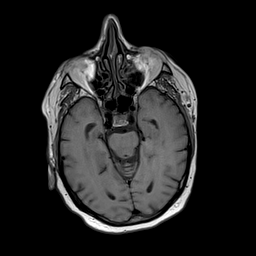
[im 110/192]
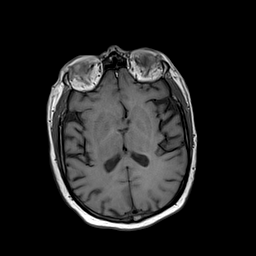
[im 137/192]
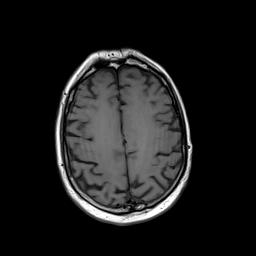
[im 164/192]
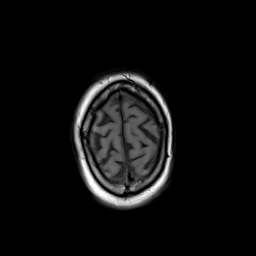
[im 192/192]
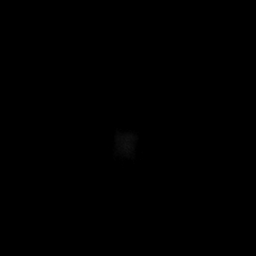

[Series 11: T2 post-contrast · axial · 1.0mm · 1.02mm/px · z∈[-101,+87]mm · 8 of 192 slices shown (1 of 2)]
[im 1/192]
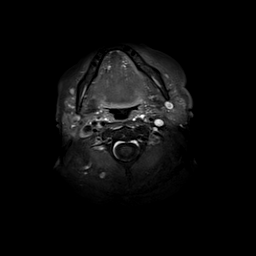
[im 28/192]
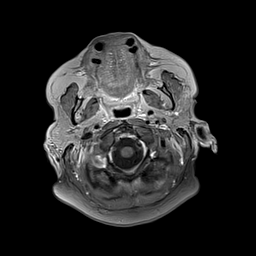
[im 55/192]
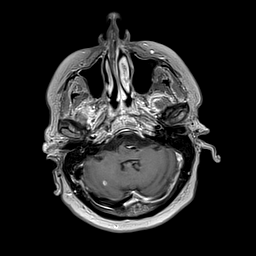
[im 82/192]
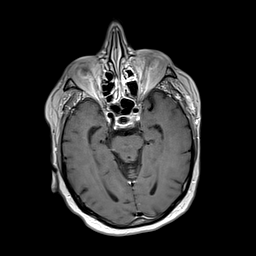
[im 110/192]
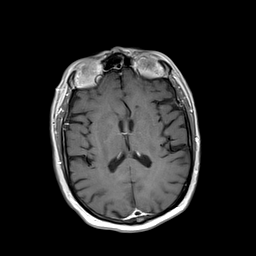
[im 137/192]
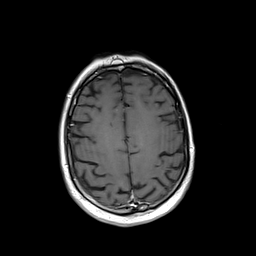
[im 164/192]
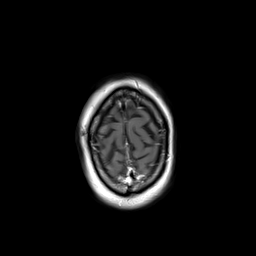
[im 192/192]
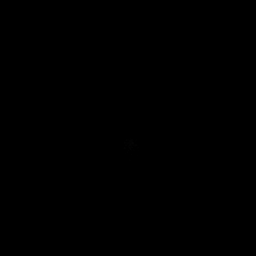

[Series 12: T2 post-contrast · coronal · 3.0mm · 0.86mm/px · 2 of 53 slices shown (2 of 2)]
[im 1/53]
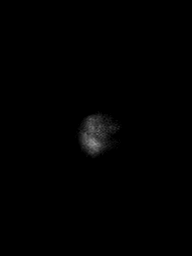
[im 53/53]
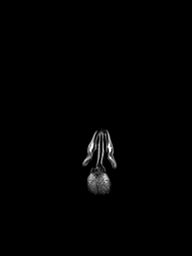

[Series 13: T1 post-contrast · axial · 1.0mm · 0.81mm/px · z∈[-102,+88]mm · 8 of 192 slices shown (1 of 2)]
[im 1/192]
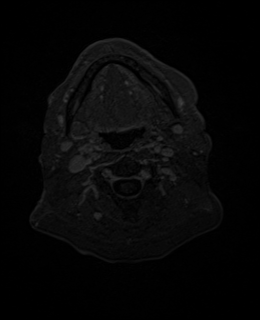
[im 28/192]
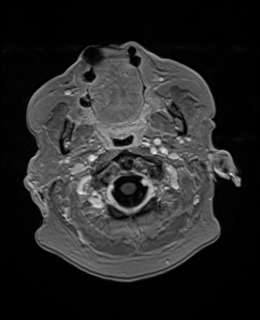
[im 55/192]
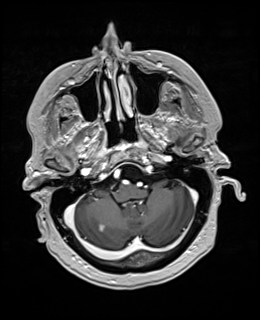
[im 82/192]
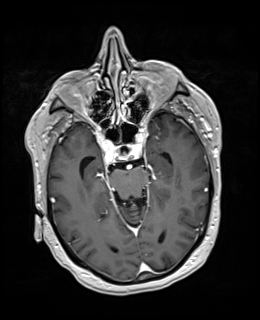
[im 110/192]
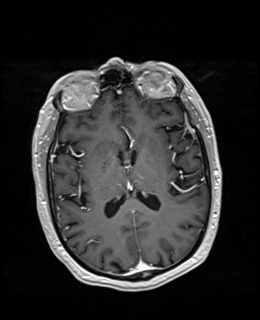
[im 137/192]
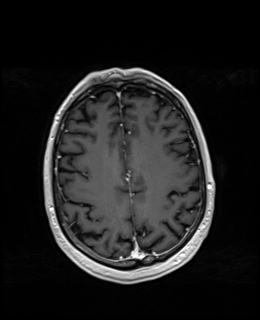
[im 164/192]
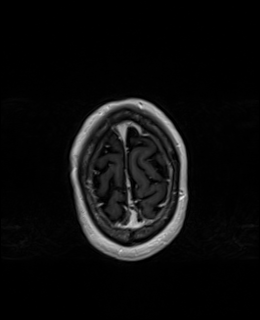
[im 192/192]
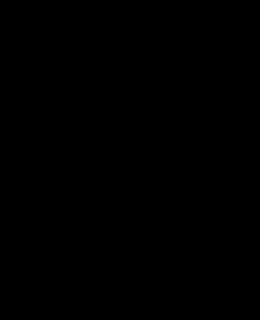

[Series 14: T1 post-contrast · coronal · 3.0mm · 0.86mm/px · 2 of 53 slices shown (2 of 2)]
[im 1/53]
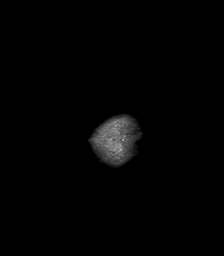
[im 53/53]
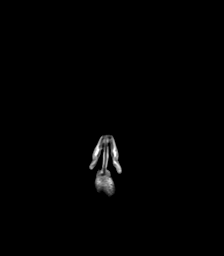

[Series 15: FLAIR post-contrast · sagittal · 3.0mm · 0.94mm/px · 2 of 39 slices shown]
[im 1/39]
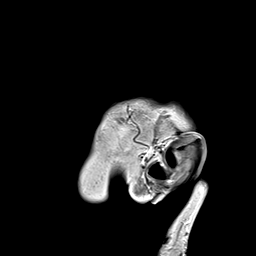
[im 39/39]
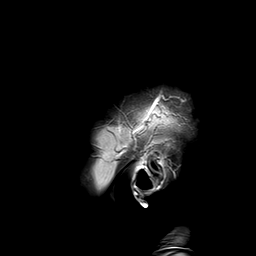

[48 of 48 positions shown; findings below may reference images not displayed]

FINDINGS: Brain: No acute infarct. No mass abnormal mass effect or midline
shift. No acute hemorrhage. No extra-axial fluid collection. No
hydrocephalus.

Possible new lesions:

*There is a punctate focus of enhancement in the right frontal
cortex (see series 11, image 136) which appears to be in a separate
location than the focus of enhancement seen on the prior.
*There is an additional punctate focus of enhancement in the
inferior aspect of the right frontal lobe (series 11, image 102).
*New punctate focus of enhancement in the posterior high right
frontal lobe (series 11, image 151).

Improved lesions:

*The previously noted minimally enhancing medial left cerebellar
lesion is no longer visualized.
*The inferior right cerebellar lesion has decreased in size, now
measuring 5 mm on series 13, image 56 (previously 7 mm).
*The previously seen other punctate cerebellar foci of enhancement
are no longer visualized.
*The previously seen punctate focus of enhancement in the right
frontal lobe is not confidently visualized.

Similar lesions:

*Punctate focus of enhancement in the posterolateral left temporal
cortex (series 11, image 100) is similar to prior.

Likely vascular: Two small areas of enhancement in the right basal
ganglia (series 11, image 117) are favored to be vascular given
branching/linear appearance.

Vascular: Major arterial flow voids are maintained at the skull
base.

Skull and upper cervical spine: Enhancing 1.3 cm left clival lesion
appears slightly decreased in conspicuity.

Sinuses/Orbits: Mild paranasal sinus mucosal thickening without
air-fluid levels. Unremarkable orbits.

Other: No mastoid effusions.
IMPRESSION: 1. Three new punctate foci of enhancement in the right frontal lobe
(as detailed above), which may represent new tiny metastases versus
vessels.
2. Otherwise, multiple enhancing lesions described on prior are
either smaller/less visible or stable, as detailed above. A punctate
focus of enhancement in the lateral aspect of the posterolateral
left temporal lobe was not described on the prior but appears
similar.
3. Enhancing 1.3 cm left clival lesion appears slightly decreased in
conspicuity.

## 2021-07-28 IMAGING — DX DG RIBS 2V*R*
4 series · 4 of 4 positions shown · non-contrast
Comparison: Chest radiograph March 02, 2020; PET-CT March 17, 2020

CLINICAL DATA: Chest pain.  Reported lung carcinoma

EXAM:
RIGHT RIBS - 2 VIEW

[rib pa (1 of 2)]
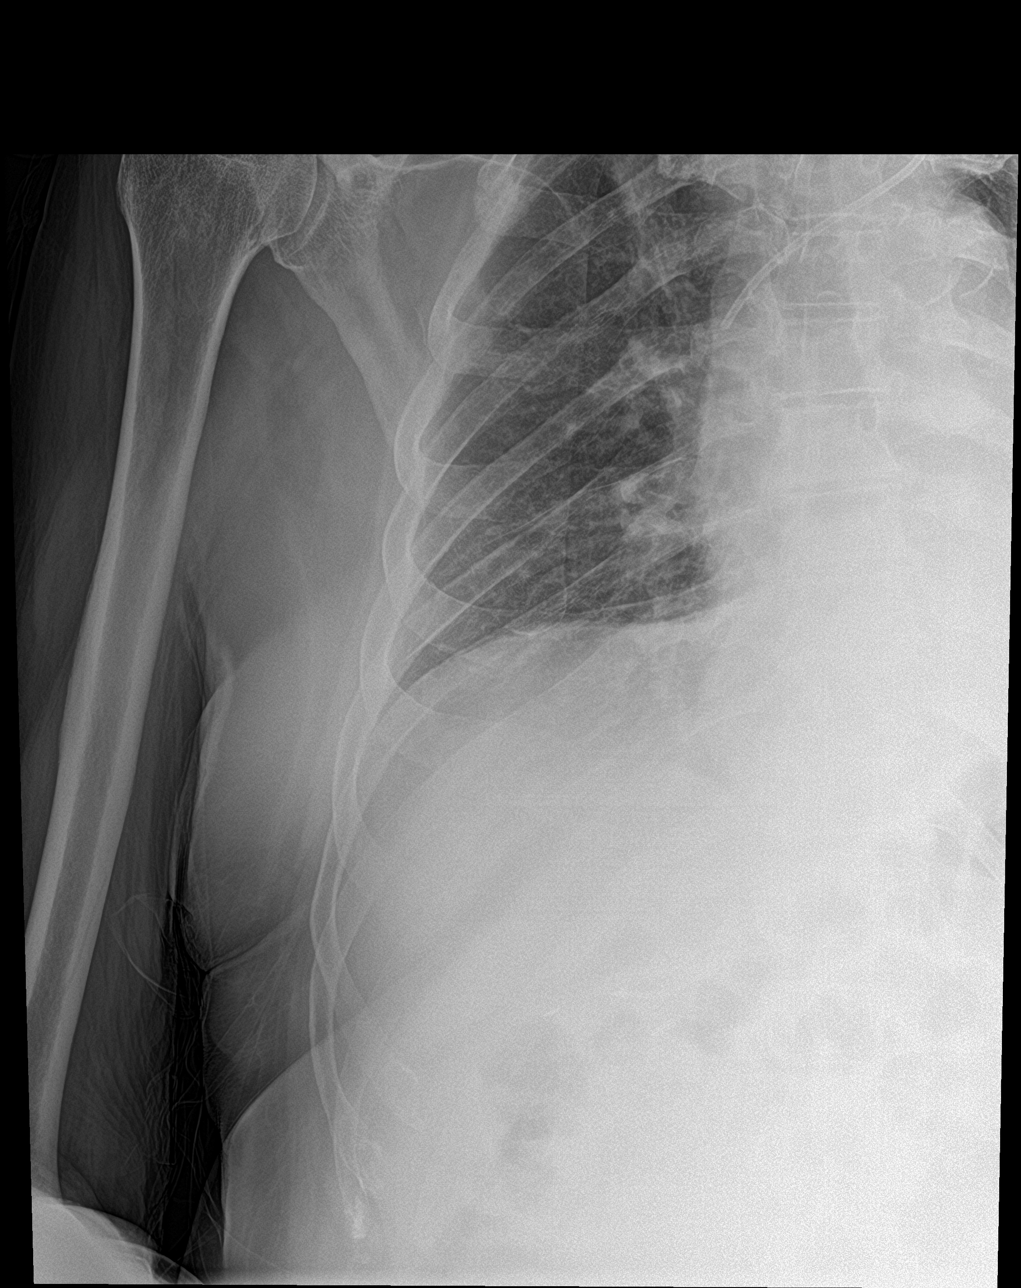

[rib pa obl (1 of 2)]
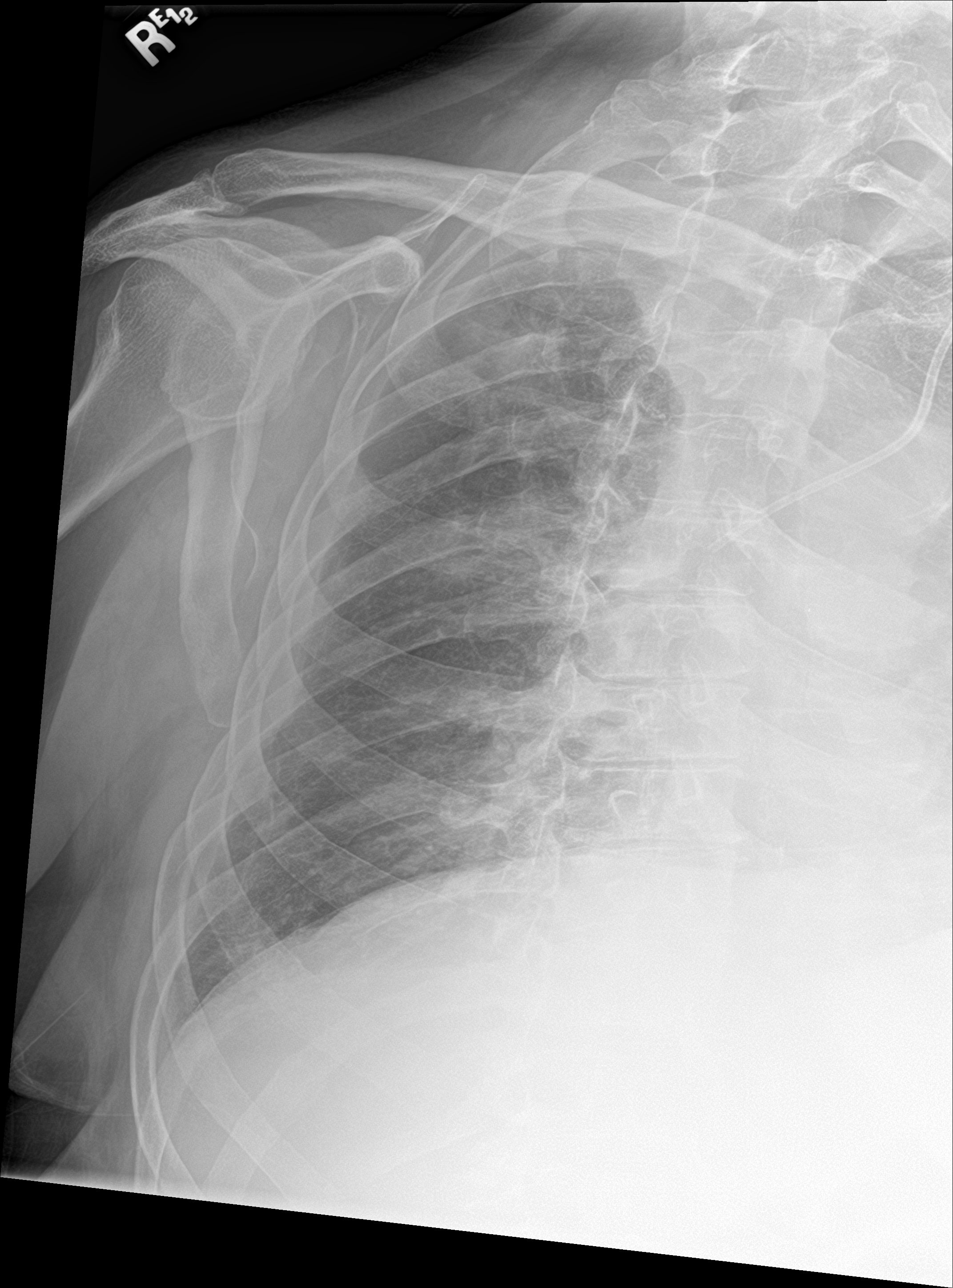

[rib pa obl (2 of 2)]
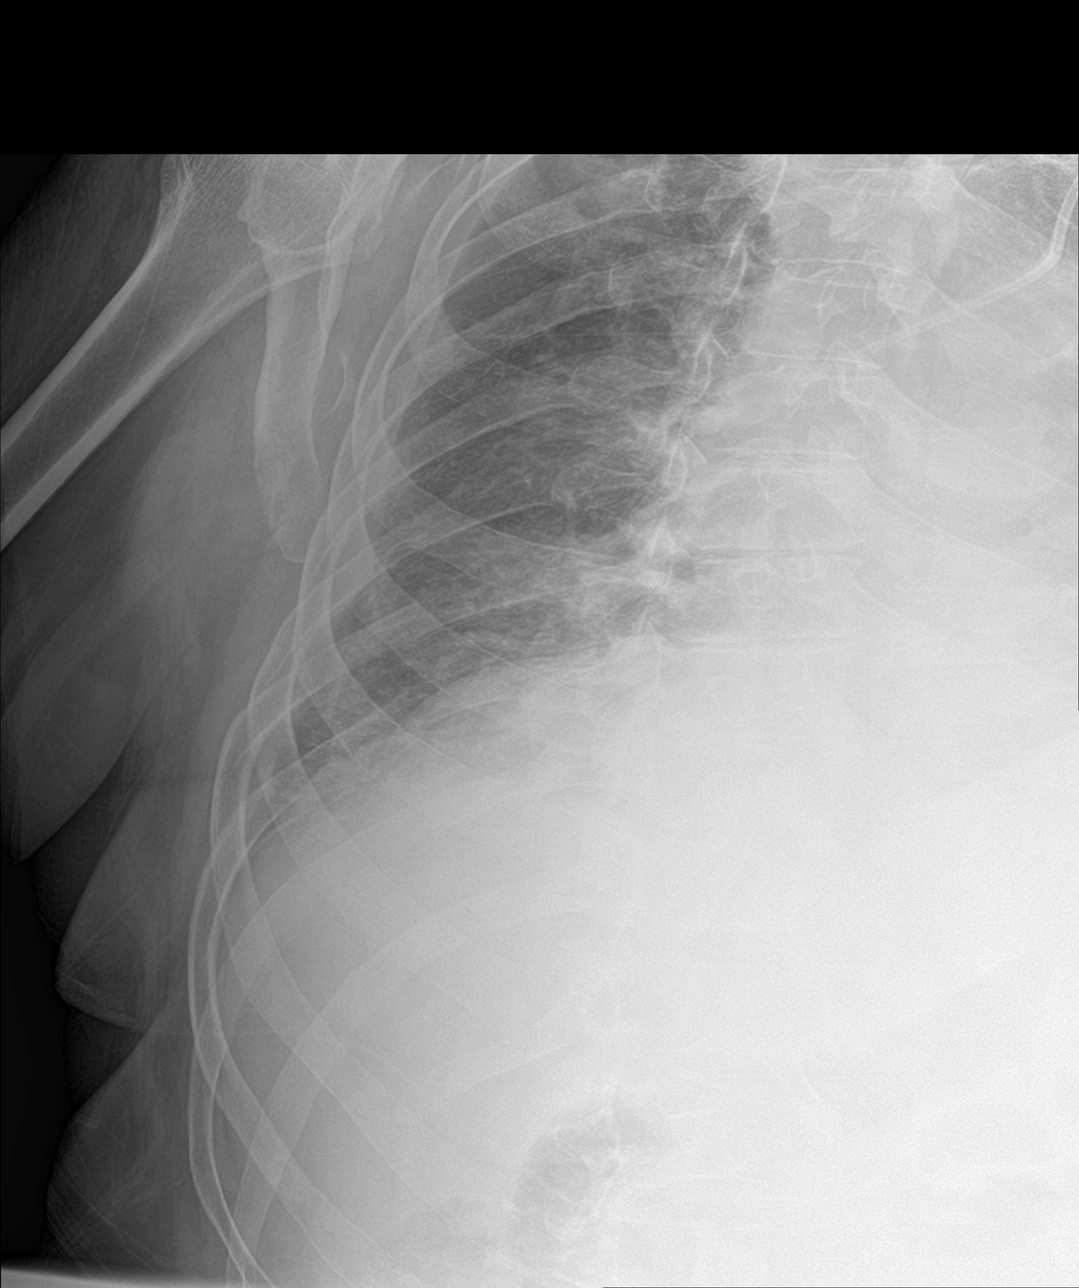

[rib pa (2 of 2)]
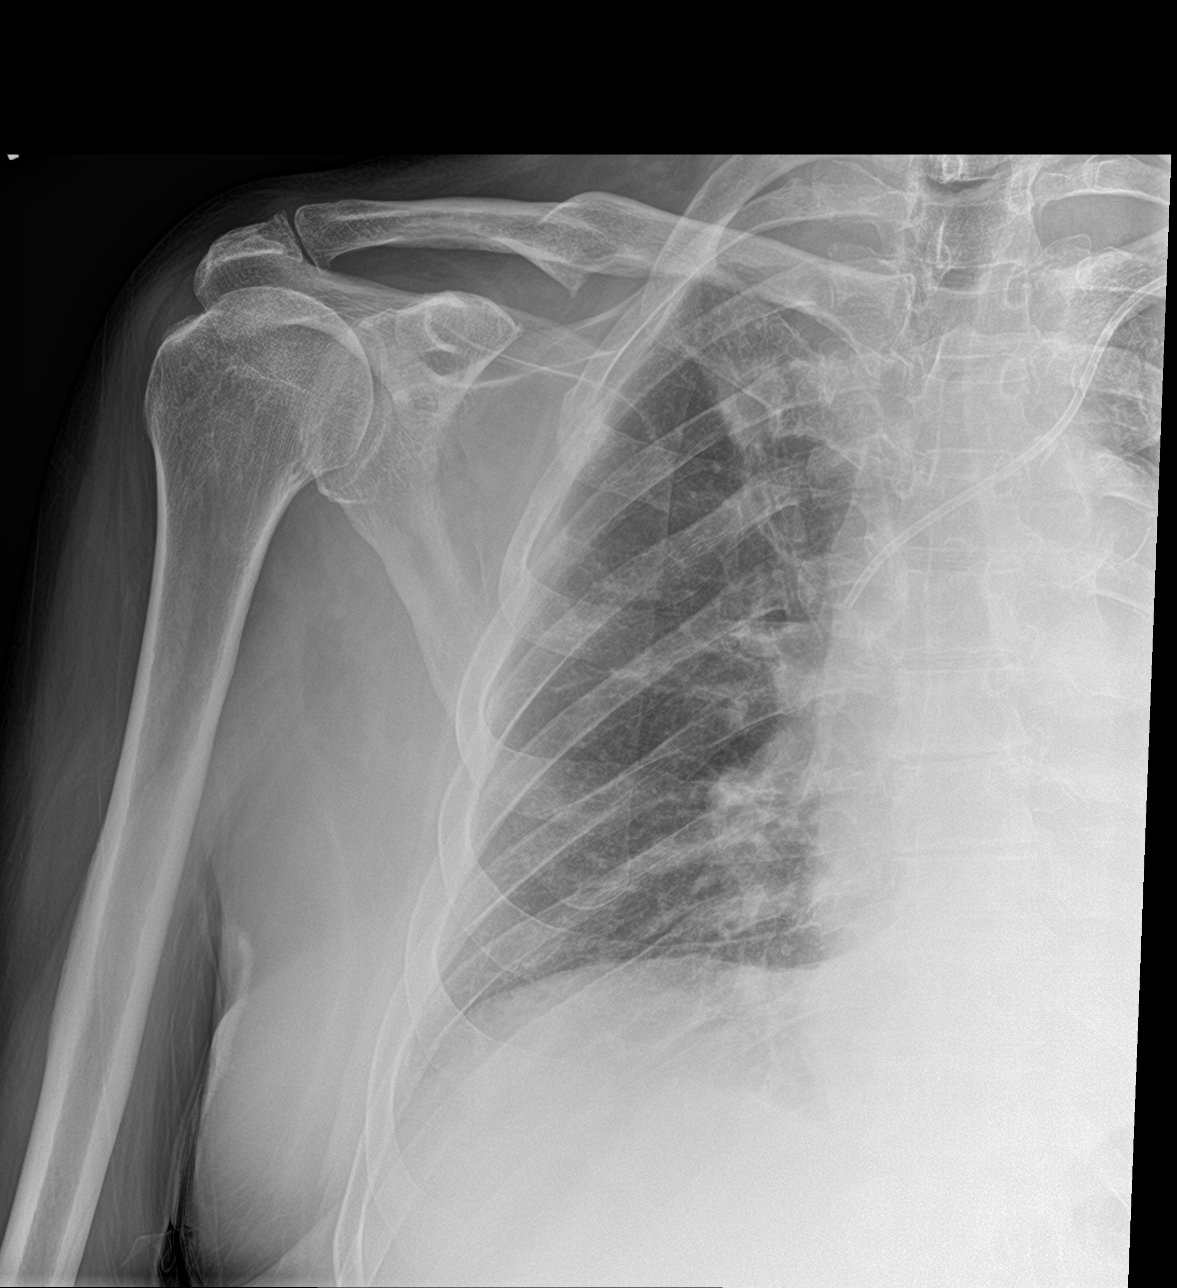

[4 of 4 positions shown; findings below may reference images not displayed]

FINDINGS: Frontal and oblique views obtained. There is ill-defined opacity in
the right upper lobe region. Apical pleural thickening bilaterally
right lung otherwise clear. There is no right-sided pneumothorax or
pleural effusion. No appreciable rib fracture. No blastic or lytic
bone lesions apparent. Port-A-Cath tip in superior vena cava. Old
healed fracture right clavicle. Bony overgrowth along the inferior
right clavicle laterally.
IMPRESSION: No acute rib fracture. No blastic or lytic bone lesions. Old healed
fracture right clavicle midportion. Bony overgrowth along the
inferolateral right clavicle potentially places patient at increased
risk for impingement syndrome.

Apical pleural thickening bilaterally with ill-defined opacity right
upper lobe. Neoplastic involvement in the right upper lobe
questioned given clinical history in appearance on prior PET study.

## 2023-08-11 NOTE — Progress Notes (Signed)
 Patient was seen under different EMR- Urochart by Dr. Inga Manges.
